# Patient Record
Sex: Male | Born: 1955
Health system: Southern US, Community
[De-identification: ages and names within clinical notes are randomized; demographics above are authoritative.]

## PROBLEM LIST (undated history)

## (undated) DIAGNOSIS — M1990 Unspecified osteoarthritis, unspecified site: Secondary | ICD-10-CM

## (undated) DIAGNOSIS — D751 Secondary polycythemia: Principal | ICD-10-CM

## (undated) DIAGNOSIS — R918 Other nonspecific abnormal finding of lung field: Secondary | ICD-10-CM

## (undated) DIAGNOSIS — R7989 Other specified abnormal findings of blood chemistry: Secondary | ICD-10-CM

## (undated) DIAGNOSIS — S22009A Unspecified fracture of unspecified thoracic vertebra, initial encounter for closed fracture: Secondary | ICD-10-CM

## (undated) DIAGNOSIS — D45 Polycythemia vera: Secondary | ICD-10-CM

## (undated) DIAGNOSIS — K219 Gastro-esophageal reflux disease without esophagitis: Secondary | ICD-10-CM

## (undated) DIAGNOSIS — M109 Gout, unspecified: Secondary | ICD-10-CM

## (undated) HISTORY — DX: Polycythemia vera: D45

## (undated) HISTORY — DX: Gout, unspecified: M10.9

## (undated) HISTORY — DX: Unspecified osteoarthritis, unspecified site: M19.90

## (undated) HISTORY — DX: Gastro-esophageal reflux disease without esophagitis: K21.9

## (undated) HISTORY — DX: Other nonspecific abnormal finding of lung field: R91.8

## (undated) HISTORY — DX: Secondary polycythemia: D75.1

## (undated) HISTORY — DX: Other specified abnormal findings of blood chemistry: R79.89

## (undated) HISTORY — DX: Unspecified fracture of unspecified thoracic vertebra, initial encounter for closed fracture: S22.009A

---

## 2011-12-18 HISTORY — PX: COLONOSCOPY: SHX174

## 2013-09-28 DIAGNOSIS — H11439 Conjunctival hyperemia, unspecified eye: Secondary | ICD-10-CM | POA: Insufficient documentation

## 2013-12-30 ENCOUNTER — Emergency Department: Payer: Self-pay | Admitting: Emergency Medicine

## 2014-01-01 ENCOUNTER — Ambulatory Visit: Payer: Self-pay | Admitting: Internal Medicine

## 2014-01-01 ENCOUNTER — Ambulatory Visit: Payer: Self-pay | Admitting: Cardiothoracic Surgery

## 2014-01-01 LAB — COMPREHENSIVE METABOLIC PANEL
ALK PHOS: 89 U/L
AST: 18 U/L (ref 15–37)
Albumin: 3.9 g/dL (ref 3.4–5.0)
Anion Gap: 10 (ref 7–16)
BILIRUBIN TOTAL: 0.5 mg/dL (ref 0.2–1.0)
BUN: 12 mg/dL (ref 7–18)
Calcium, Total: 8.6 mg/dL (ref 8.5–10.1)
Chloride: 101 mmol/L (ref 98–107)
Co2: 30 mmol/L (ref 21–32)
Creatinine: 0.99 mg/dL (ref 0.60–1.30)
Glucose: 104 mg/dL — ABNORMAL HIGH (ref 65–99)
Osmolality: 281 (ref 275–301)
Potassium: 3.9 mmol/L (ref 3.5–5.1)
SGPT (ALT): 26 U/L (ref 12–78)
Sodium: 141 mmol/L (ref 136–145)
Total Protein: 7.4 g/dL (ref 6.4–8.2)

## 2014-01-01 LAB — CBC CANCER CENTER
Basophil #: 0.1 x10 3/mm (ref 0.0–0.1)
Basophil %: 1.2 %
Eosinophil #: 0.1 x10 3/mm (ref 0.0–0.7)
Eosinophil %: 1.5 %
HCT: 40.4 % (ref 40.0–52.0)
HGB: 12.5 g/dL — ABNORMAL LOW (ref 13.0–18.0)
LYMPHS ABS: 0.9 x10 3/mm — AB (ref 1.0–3.6)
LYMPHS PCT: 12.9 %
MCH: 28.6 pg (ref 26.0–34.0)
MCHC: 31 g/dL — ABNORMAL LOW (ref 32.0–36.0)
MCV: 92 fL (ref 80–100)
Monocyte #: 0.5 x10 3/mm (ref 0.2–1.0)
Monocyte %: 6.6 %
Neutrophil #: 5.6 x10 3/mm (ref 1.4–6.5)
Neutrophil %: 77.8 %
Platelet: 190 x10 3/mm (ref 150–440)
RBC: 4.38 10*6/uL — AB (ref 4.40–5.90)
RDW: 17.2 % — AB (ref 11.5–14.5)
WBC: 7.2 x10 3/mm (ref 3.8–10.6)

## 2014-01-04 LAB — PROT IMMUNOELECTROPHORES(ARMC)

## 2014-01-04 LAB — KAPPA/LAMBDA FREE LIGHT CHAINS (ARMC)

## 2014-01-06 LAB — CBC CANCER CENTER
Basophil #: 0.1 x10 3/mm (ref 0.0–0.1)
Basophil %: 1 %
EOS PCT: 1.6 %
Eosinophil #: 0.1 x10 3/mm (ref 0.0–0.7)
HCT: 42.2 % (ref 40.0–52.0)
HGB: 13.4 g/dL (ref 13.0–18.0)
Lymphocyte #: 0.9 x10 3/mm — ABNORMAL LOW (ref 1.0–3.6)
Lymphocyte %: 11.9 %
MCH: 29.4 pg (ref 26.0–34.0)
MCHC: 31.6 g/dL — ABNORMAL LOW (ref 32.0–36.0)
MCV: 93 fL (ref 80–100)
MONO ABS: 0.3 x10 3/mm (ref 0.2–1.0)
Monocyte %: 3.3 %
NEUTROS ABS: 6.4 x10 3/mm (ref 1.4–6.5)
Neutrophil %: 82.2 %
Platelet: 156 x10 3/mm (ref 150–440)
RBC: 4.55 10*6/uL (ref 4.40–5.90)
RDW: 17.2 % — AB (ref 11.5–14.5)
WBC: 7.8 x10 3/mm (ref 3.8–10.6)

## 2014-01-07 LAB — CEA: CEA: 1.2 ng/mL (ref 0.0–4.7)

## 2014-01-13 LAB — CBC CANCER CENTER
Basophil #: 0.1 x10 3/mm (ref 0.0–0.1)
Basophil %: 0.8 %
Eosinophil #: 0.2 x10 3/mm (ref 0.0–0.7)
Eosinophil %: 2 %
HCT: 40.2 % (ref 40.0–52.0)
HGB: 12.7 g/dL — ABNORMAL LOW (ref 13.0–18.0)
LYMPHS ABS: 1.1 x10 3/mm (ref 1.0–3.6)
Lymphocyte %: 12.3 %
MCH: 29.1 pg (ref 26.0–34.0)
MCHC: 31.6 g/dL — AB (ref 32.0–36.0)
MCV: 92 fL (ref 80–100)
MONO ABS: 0.4 x10 3/mm (ref 0.2–1.0)
MONOS PCT: 4.4 %
NEUTROS PCT: 80.5 %
Neutrophil #: 7.2 x10 3/mm — ABNORMAL HIGH (ref 1.4–6.5)
PLATELETS: 244 x10 3/mm (ref 150–440)
RBC: 4.37 10*6/uL — AB (ref 4.40–5.90)
RDW: 16.9 % — AB (ref 11.5–14.5)
WBC: 9 x10 3/mm (ref 3.8–10.6)

## 2014-01-14 LAB — PSA: PSA: 1 ng/mL (ref 0.0–4.0)

## 2014-01-17 ENCOUNTER — Ambulatory Visit: Payer: Self-pay | Admitting: Cardiothoracic Surgery

## 2014-01-20 LAB — CBC CANCER CENTER
BASOS PCT: 1 %
Basophil #: 0.1 x10 3/mm (ref 0.0–0.1)
EOS ABS: 0.2 x10 3/mm (ref 0.0–0.7)
EOS PCT: 2.5 %
HCT: 37.5 % — ABNORMAL LOW (ref 40.0–52.0)
HGB: 11.9 g/dL — ABNORMAL LOW (ref 13.0–18.0)
LYMPHS ABS: 0.9 x10 3/mm — AB (ref 1.0–3.6)
LYMPHS PCT: 12.1 %
MCH: 28.9 pg (ref 26.0–34.0)
MCHC: 31.7 g/dL — AB (ref 32.0–36.0)
MCV: 91 fL (ref 80–100)
MONO ABS: 0.3 x10 3/mm (ref 0.2–1.0)
Monocyte %: 4.4 %
Neutrophil #: 5.8 x10 3/mm (ref 1.4–6.5)
Neutrophil %: 80 %
PLATELETS: 339 x10 3/mm (ref 150–440)
RBC: 4.1 10*6/uL — ABNORMAL LOW (ref 4.40–5.90)
RDW: 16.3 % — AB (ref 11.5–14.5)
WBC: 7.3 x10 3/mm (ref 3.8–10.6)

## 2014-01-27 LAB — CBC CANCER CENTER
BASOS PCT: 0.8 %
Basophil #: 0 x10 3/mm (ref 0.0–0.1)
EOS ABS: 0.2 x10 3/mm (ref 0.0–0.7)
Eosinophil %: 2.7 %
HCT: 37.1 % — ABNORMAL LOW (ref 40.0–52.0)
HGB: 11.7 g/dL — AB (ref 13.0–18.0)
Lymphocyte #: 1.1 x10 3/mm (ref 1.0–3.6)
Lymphocyte %: 16.8 %
MCH: 28.4 pg (ref 26.0–34.0)
MCHC: 31.5 g/dL — ABNORMAL LOW (ref 32.0–36.0)
MCV: 90 fL (ref 80–100)
MONO ABS: 0.4 x10 3/mm (ref 0.2–1.0)
Monocyte %: 5.7 %
NEUTROS ABS: 4.8 x10 3/mm (ref 1.4–6.5)
Neutrophil %: 74 %
Platelet: 184 x10 3/mm (ref 150–440)
RBC: 4.11 10*6/uL — AB (ref 4.40–5.90)
RDW: 15.8 % — AB (ref 11.5–14.5)
WBC: 6.6 x10 3/mm (ref 3.8–10.6)

## 2014-01-27 LAB — CREATININE, SERUM
Creatinine: 1.12 mg/dL (ref 0.60–1.30)
EGFR (African American): >60
EGFR (Non-African Amer.): >60

## 2014-02-08 LAB — CBC CANCER CENTER
BASOS ABS: 0.1 x10 3/mm (ref 0.0–0.1)
Basophil %: 0.7 %
EOS PCT: 2.8 %
Eosinophil #: 0.3 x10 3/mm (ref 0.0–0.7)
HCT: 38.8 % — AB (ref 40.0–52.0)
HGB: 12.1 g/dL — ABNORMAL LOW (ref 13.0–18.0)
Lymphocyte #: 1.5 x10 3/mm (ref 1.0–3.6)
Lymphocyte %: 15.7 %
MCH: 27.8 pg (ref 26.0–34.0)
MCHC: 31.1 g/dL — ABNORMAL LOW (ref 32.0–36.0)
MCV: 89 fL (ref 80–100)
Monocyte #: 0.5 x10 3/mm (ref 0.2–1.0)
Monocyte %: 5.2 %
NEUTROS ABS: 7.1 x10 3/mm — AB (ref 1.4–6.5)
Neutrophil %: 75.6 %
PLATELETS: 206 x10 3/mm (ref 150–440)
RBC: 4.34 10*6/uL — ABNORMAL LOW (ref 4.40–5.90)
RDW: 15.5 % — AB (ref 11.5–14.5)
WBC: 9.4 x10 3/mm (ref 3.8–10.6)

## 2014-02-14 ENCOUNTER — Ambulatory Visit: Payer: Self-pay | Admitting: Cardiothoracic Surgery

## 2014-02-19 LAB — CBC CANCER CENTER
Basophil #: 0.1 x10 3/mm (ref 0.0–0.1)
Basophil %: 0.8 %
Eosinophil #: 0.3 x10 3/mm (ref 0.0–0.7)
Eosinophil %: 3 %
HCT: 39.9 % — ABNORMAL LOW (ref 40.0–52.0)
HGB: 12.4 g/dL — AB (ref 13.0–18.0)
LYMPHS PCT: 15.1 %
Lymphocyte #: 1.4 x10 3/mm (ref 1.0–3.6)
MCH: 27 pg (ref 26.0–34.0)
MCHC: 31.1 g/dL — AB (ref 32.0–36.0)
MCV: 87 fL (ref 80–100)
MONO ABS: 0.5 x10 3/mm (ref 0.2–1.0)
Monocyte %: 5.2 %
NEUTROS ABS: 7 x10 3/mm — AB (ref 1.4–6.5)
Neutrophil %: 75.9 %
PLATELETS: 388 x10 3/mm (ref 150–440)
RBC: 4.58 10*6/uL (ref 4.40–5.90)
RDW: 15.8 % — AB (ref 11.5–14.5)
WBC: 9.2 x10 3/mm (ref 3.8–10.6)

## 2014-03-02 ENCOUNTER — Emergency Department: Payer: Self-pay | Admitting: Emergency Medicine

## 2014-03-02 LAB — CBC WITH DIFFERENTIAL/PLATELET
Basophil #: 0.1 10*3/uL (ref 0.0–0.1)
Basophil %: 1.1 %
Eosinophil #: 0.3 10*3/uL (ref 0.0–0.7)
Eosinophil %: 3 %
HCT: 40.4 % (ref 40.0–52.0)
HGB: 12.7 g/dL — ABNORMAL LOW (ref 13.0–18.0)
Lymphocyte #: 1 10*3/uL (ref 1.0–3.6)
Lymphocyte %: 10.8 %
MCH: 27 pg (ref 26.0–34.0)
MCHC: 31.5 g/dL — AB (ref 32.0–36.0)
MCV: 86 fL (ref 80–100)
Monocyte #: 0.3 x10 3/mm (ref 0.2–1.0)
Monocyte %: 3.7 %
NEUTROS ABS: 7.5 10*3/uL — AB (ref 1.4–6.5)
Neutrophil %: 81.4 %
Platelet: 134 10*3/uL — ABNORMAL LOW (ref 150–440)
RBC: 4.71 10*6/uL (ref 4.40–5.90)
RDW: 16 % — ABNORMAL HIGH (ref 11.5–14.5)
WBC: 9.2 10*3/uL (ref 3.8–10.6)

## 2014-03-02 LAB — COMPREHENSIVE METABOLIC PANEL
ALK PHOS: 96 U/L
ALT: 24 U/L (ref 12–78)
AST: 20 U/L (ref 15–37)
Albumin: 3.7 g/dL (ref 3.4–5.0)
Anion Gap: 5 — ABNORMAL LOW (ref 7–16)
BILIRUBIN TOTAL: 0.6 mg/dL (ref 0.2–1.0)
BUN: 9 mg/dL (ref 7–18)
Calcium, Total: 8.6 mg/dL (ref 8.5–10.1)
Chloride: 109 mmol/L — ABNORMAL HIGH (ref 98–107)
Co2: 26 mmol/L (ref 21–32)
Creatinine: 0.99 mg/dL (ref 0.60–1.30)
EGFR (African American): >60
EGFR (Non-African Amer.): >60
GLUCOSE: 108 mg/dL — AB (ref 65–99)
OSMOLALITY: 279 (ref 275–301)
Potassium: 3.8 mmol/L (ref 3.5–5.1)
Sodium: 140 mmol/L (ref 136–145)
Total Protein: 7 g/dL (ref 6.4–8.2)

## 2014-03-02 LAB — LIPASE, BLOOD: Lipase: 248 U/L (ref 73–393)

## 2014-03-04 LAB — CBC CANCER CENTER
BASOS ABS: 0.1 x10 3/mm (ref 0.0–0.1)
Basophil %: 1.2 %
Eosinophil #: 0.4 x10 3/mm (ref 0.0–0.7)
Eosinophil %: 3.6 %
HCT: 40.5 % (ref 40.0–52.0)
HGB: 12.5 g/dL — AB (ref 13.0–18.0)
Lymphocyte #: 1.5 x10 3/mm (ref 1.0–3.6)
Lymphocyte %: 14.6 %
MCH: 26.3 pg (ref 26.0–34.0)
MCHC: 30.9 g/dL — AB (ref 32.0–36.0)
MCV: 85 fL (ref 80–100)
MONO ABS: 0.4 x10 3/mm (ref 0.2–1.0)
MONOS PCT: 4 %
NEUTROS ABS: 7.9 x10 3/mm — AB (ref 1.4–6.5)
NEUTROS PCT: 76.6 %
PLATELETS: 163 x10 3/mm (ref 150–440)
RBC: 4.75 10*6/uL (ref 4.40–5.90)
RDW: 16.3 % — ABNORMAL HIGH (ref 11.5–14.5)
WBC: 10.3 x10 3/mm (ref 3.8–10.6)

## 2014-03-17 ENCOUNTER — Ambulatory Visit: Payer: Self-pay | Admitting: Cardiothoracic Surgery

## 2014-03-18 LAB — CBC CANCER CENTER
BASOS ABS: 0.1 x10 3/mm (ref 0.0–0.1)
BASOS PCT: 0.9 %
Eosinophil #: 0.4 x10 3/mm (ref 0.0–0.7)
Eosinophil %: 3.9 %
HCT: 40.3 % (ref 40.0–52.0)
HGB: 12.3 g/dL — AB (ref 13.0–18.0)
Lymphocyte #: 1.5 x10 3/mm (ref 1.0–3.6)
Lymphocyte %: 14.9 %
MCH: 25.8 pg — ABNORMAL LOW (ref 26.0–34.0)
MCHC: 30.6 g/dL — AB (ref 32.0–36.0)
MCV: 84 fL (ref 80–100)
MONO ABS: 0.5 x10 3/mm (ref 0.2–1.0)
Monocyte %: 5.1 %
NEUTROS ABS: 7.6 x10 3/mm — AB (ref 1.4–6.5)
Neutrophil %: 75.2 %
PLATELETS: 358 x10 3/mm (ref 150–440)
RBC: 4.78 10*6/uL (ref 4.40–5.90)
RDW: 16.3 % — AB (ref 11.5–14.5)
WBC: 10.1 x10 3/mm (ref 3.8–10.6)

## 2014-03-29 LAB — CBC CANCER CENTER
Basophil #: 0.1 x10 3/mm (ref 0.0–0.1)
Basophil %: 1.1 %
EOS ABS: 0.3 x10 3/mm (ref 0.0–0.7)
Eosinophil %: 4.1 %
HCT: 39.7 % — ABNORMAL LOW (ref 40.0–52.0)
HGB: 12.1 g/dL — ABNORMAL LOW (ref 13.0–18.0)
Lymphocyte #: 1.3 x10 3/mm (ref 1.0–3.6)
Lymphocyte %: 15.6 %
MCH: 25.2 pg — AB (ref 26.0–34.0)
MCHC: 30.5 g/dL — AB (ref 32.0–36.0)
MCV: 83 fL (ref 80–100)
MONOS PCT: 4.4 %
Monocyte #: 0.4 x10 3/mm (ref 0.2–1.0)
NEUTROS PCT: 74.8 %
Neutrophil #: 6.2 x10 3/mm (ref 1.4–6.5)
PLATELETS: 151 x10 3/mm (ref 150–440)
RBC: 4.8 10*6/uL (ref 4.40–5.90)
RDW: 16.2 % — AB (ref 11.5–14.5)
WBC: 8.3 x10 3/mm (ref 3.8–10.6)

## 2014-04-02 LAB — CANCER CENTER HEMATOCRIT: HCT: 40.6 % (ref 40.0–52.0)

## 2014-04-02 LAB — CANCER CTR PLATELET CT: Platelet: 168 x10 3/mm (ref 150–440)

## 2014-04-08 LAB — CBC CANCER CENTER
Basophil #: 0.1 x10 3/mm (ref 0.0–0.1)
Basophil %: 0.9 %
Eosinophil #: 0.4 x10 3/mm (ref 0.0–0.7)
Eosinophil %: 4.1 %
HCT: 40.9 % (ref 40.0–52.0)
HGB: 12.3 g/dL — ABNORMAL LOW (ref 13.0–18.0)
LYMPHS ABS: 1.4 x10 3/mm (ref 1.0–3.6)
LYMPHS PCT: 15.3 %
MCH: 24.8 pg — ABNORMAL LOW (ref 26.0–34.0)
MCHC: 30 g/dL — ABNORMAL LOW (ref 32.0–36.0)
MCV: 83 fL (ref 80–100)
Monocyte #: 0.5 x10 3/mm (ref 0.2–1.0)
Monocyte %: 5 %
NEUTROS ABS: 6.8 x10 3/mm — AB (ref 1.4–6.5)
Neutrophil %: 74.7 %
PLATELETS: 288 x10 3/mm (ref 150–440)
RBC: 4.94 10*6/uL (ref 4.40–5.90)
RDW: 16.6 % — AB (ref 11.5–14.5)
WBC: 9.1 x10 3/mm (ref 3.8–10.6)

## 2014-04-16 ENCOUNTER — Ambulatory Visit: Payer: Self-pay | Admitting: Cardiothoracic Surgery

## 2014-04-26 LAB — CBC CANCER CENTER
BASOS ABS: 0.1 x10 3/mm (ref 0.0–0.1)
Basophil %: 1.1 %
EOS PCT: 3.8 %
Eosinophil #: 0.4 x10 3/mm (ref 0.0–0.7)
HCT: 40 % (ref 40.0–52.0)
HGB: 12.4 g/dL — ABNORMAL LOW (ref 13.0–18.0)
LYMPHS PCT: 14.8 %
Lymphocyte #: 1.4 x10 3/mm (ref 1.0–3.6)
MCH: 24.9 pg — ABNORMAL LOW (ref 26.0–34.0)
MCHC: 30.9 g/dL — ABNORMAL LOW (ref 32.0–36.0)
MCV: 81 fL (ref 80–100)
Monocyte #: 0.4 x10 3/mm (ref 0.2–1.0)
Monocyte %: 4.3 %
Neutrophil #: 7.4 x10 3/mm — ABNORMAL HIGH (ref 1.4–6.5)
Neutrophil %: 76 %
Platelet: 236 x10 3/mm (ref 150–440)
RBC: 4.95 10*6/uL (ref 4.40–5.90)
RDW: 16.5 % — AB (ref 11.5–14.5)
WBC: 9.7 x10 3/mm (ref 3.8–10.6)

## 2014-05-12 LAB — CBC CANCER CENTER
BASOS ABS: 0.1 x10 3/mm (ref 0.0–0.1)
Basophil %: 1.1 %
EOS ABS: 0.4 x10 3/mm (ref 0.0–0.7)
Eosinophil %: 4 %
HCT: 42.6 % (ref 40.0–52.0)
HGB: 13.2 g/dL (ref 13.0–18.0)
LYMPHS PCT: 14.8 %
Lymphocyte #: 1.4 x10 3/mm (ref 1.0–3.6)
MCH: 24.9 pg — ABNORMAL LOW (ref 26.0–34.0)
MCHC: 31 g/dL — ABNORMAL LOW (ref 32.0–36.0)
MCV: 80 fL (ref 80–100)
Monocyte #: 0.5 x10 3/mm (ref 0.2–1.0)
Monocyte %: 5 %
NEUTROS ABS: 7.2 x10 3/mm — AB (ref 1.4–6.5)
NEUTROS PCT: 75.1 %
Platelet: 354 x10 3/mm (ref 150–440)
RBC: 5.32 10*6/uL (ref 4.40–5.90)
RDW: 16.2 % — ABNORMAL HIGH (ref 11.5–14.5)
WBC: 9.6 x10 3/mm (ref 3.8–10.6)

## 2014-05-17 ENCOUNTER — Ambulatory Visit: Payer: Self-pay | Admitting: Cardiothoracic Surgery

## 2014-05-24 LAB — CBC CANCER CENTER
BASOS ABS: 0.1 x10 3/mm (ref 0.0–0.1)
Basophil %: 1.4 %
EOS PCT: 3.9 %
Eosinophil #: 0.4 x10 3/mm (ref 0.0–0.7)
HCT: 42.8 % (ref 40.0–52.0)
HGB: 13.2 g/dL (ref 13.0–18.0)
LYMPHS ABS: 1.4 x10 3/mm (ref 1.0–3.6)
LYMPHS PCT: 13.7 %
MCH: 24.3 pg — AB (ref 26.0–34.0)
MCHC: 30.8 g/dL — AB (ref 32.0–36.0)
MCV: 79 fL — ABNORMAL LOW (ref 80–100)
MONO ABS: 0.4 x10 3/mm (ref 0.2–1.0)
Monocyte %: 3.8 %
NEUTROS ABS: 8 x10 3/mm — AB (ref 1.4–6.5)
Neutrophil %: 77.2 %
Platelet: 207 x10 3/mm (ref 150–440)
RBC: 5.41 10*6/uL (ref 4.40–5.90)
RDW: 16.1 % — AB (ref 11.5–14.5)
WBC: 10.4 x10 3/mm (ref 3.8–10.6)

## 2014-06-03 LAB — CBC CANCER CENTER
BASOS PCT: 1.1 %
Basophil #: 0.1 x10 3/mm (ref 0.0–0.1)
EOS ABS: 0.4 x10 3/mm (ref 0.0–0.7)
EOS PCT: 3.7 %
HCT: 40.8 % (ref 40.0–52.0)
HGB: 12.4 g/dL — ABNORMAL LOW (ref 13.0–18.0)
LYMPHS PCT: 12.6 %
Lymphocyte #: 1.4 x10 3/mm (ref 1.0–3.6)
MCH: 23.8 pg — ABNORMAL LOW (ref 26.0–34.0)
MCHC: 30.4 g/dL — ABNORMAL LOW (ref 32.0–36.0)
MCV: 78 fL — AB (ref 80–100)
MONOS PCT: 4.2 %
Monocyte #: 0.5 x10 3/mm (ref 0.2–1.0)
Neutrophil #: 9 x10 3/mm — ABNORMAL HIGH (ref 1.4–6.5)
Neutrophil %: 78.4 %
PLATELETS: 281 x10 3/mm (ref 150–440)
RBC: 5.22 10*6/uL (ref 4.40–5.90)
RDW: 15.9 % — ABNORMAL HIGH (ref 11.5–14.5)
WBC: 11.5 x10 3/mm — ABNORMAL HIGH (ref 3.8–10.6)

## 2014-06-16 ENCOUNTER — Ambulatory Visit: Payer: Self-pay | Admitting: Cardiothoracic Surgery

## 2014-06-17 LAB — CBC CANCER CENTER
BASOS PCT: 0.9 %
Basophil #: 0.1 x10 3/mm (ref 0.0–0.1)
Eosinophil #: 0.3 x10 3/mm (ref 0.0–0.7)
Eosinophil %: 3.3 %
HCT: 41 % (ref 40.0–52.0)
HGB: 12.5 g/dL — ABNORMAL LOW (ref 13.0–18.0)
LYMPHS ABS: 1.3 x10 3/mm (ref 1.0–3.6)
Lymphocyte %: 13.9 %
MCH: 23.8 pg — ABNORMAL LOW (ref 26.0–34.0)
MCHC: 30.5 g/dL — AB (ref 32.0–36.0)
MCV: 78 fL — ABNORMAL LOW (ref 80–100)
MONOS PCT: 4.8 %
Monocyte #: 0.5 x10 3/mm (ref 0.2–1.0)
NEUTROS ABS: 7.4 x10 3/mm — AB (ref 1.4–6.5)
Neutrophil %: 77.1 %
Platelet: 280 x10 3/mm (ref 150–440)
RBC: 5.24 10*6/uL (ref 4.40–5.90)
RDW: 16.6 % — AB (ref 11.5–14.5)
WBC: 9.6 x10 3/mm (ref 3.8–10.6)

## 2014-07-08 LAB — CBC CANCER CENTER
BASOS ABS: 0.1 x10 3/mm (ref 0.0–0.1)
Basophil %: 1.2 %
EOS PCT: 3.3 %
Eosinophil #: 0.3 x10 3/mm (ref 0.0–0.7)
HCT: 42.8 % (ref 40.0–52.0)
HGB: 13.3 g/dL (ref 13.0–18.0)
Lymphocyte #: 1.3 x10 3/mm (ref 1.0–3.6)
Lymphocyte %: 12.7 %
MCH: 24.3 pg — ABNORMAL LOW (ref 26.0–34.0)
MCHC: 31 g/dL — AB (ref 32.0–36.0)
MCV: 78 fL — ABNORMAL LOW (ref 80–100)
MONOS PCT: 3.8 %
Monocyte #: 0.4 x10 3/mm (ref 0.2–1.0)
Neutrophil #: 7.8 x10 3/mm — ABNORMAL HIGH (ref 1.4–6.5)
Neutrophil %: 79 %
Platelet: 386 x10 3/mm (ref 150–440)
RBC: 5.46 10*6/uL (ref 4.40–5.90)
RDW: 17.2 % — ABNORMAL HIGH (ref 11.5–14.5)
WBC: 9.9 x10 3/mm (ref 3.8–10.6)

## 2014-07-17 ENCOUNTER — Ambulatory Visit: Payer: Self-pay | Admitting: Cardiothoracic Surgery

## 2014-07-29 LAB — CBC CANCER CENTER
BASOS PCT: 1.1 %
Basophil #: 0.1 x10 3/mm (ref 0.0–0.1)
EOS PCT: 4.4 %
Eosinophil #: 0.5 x10 3/mm (ref 0.0–0.7)
HCT: 42.9 % (ref 40.0–52.0)
HGB: 13.1 g/dL (ref 13.0–18.0)
Lymphocyte #: 1.4 x10 3/mm (ref 1.0–3.6)
Lymphocyte %: 13.2 %
MCH: 23.7 pg — AB (ref 26.0–34.0)
MCHC: 30.6 g/dL — ABNORMAL LOW (ref 32.0–36.0)
MCV: 77 fL — ABNORMAL LOW (ref 80–100)
MONOS PCT: 4.3 %
Monocyte #: 0.5 x10 3/mm (ref 0.2–1.0)
NEUTROS ABS: 8.1 x10 3/mm — AB (ref 1.4–6.5)
Neutrophil %: 77 %
PLATELETS: 321 x10 3/mm (ref 150–440)
RBC: 5.55 10*6/uL (ref 4.40–5.90)
RDW: 16.6 % — ABNORMAL HIGH (ref 11.5–14.5)
WBC: 10.5 x10 3/mm (ref 3.8–10.6)

## 2014-08-17 ENCOUNTER — Ambulatory Visit: Payer: Self-pay | Admitting: Cardiothoracic Surgery

## 2014-08-19 LAB — CBC CANCER CENTER
Basophil #: 0.1 x10 3/mm (ref 0.0–0.1)
Basophil %: 0.9 %
Eosinophil #: 0.3 x10 3/mm (ref 0.0–0.7)
Eosinophil %: 3.1 %
HCT: 43.8 % (ref 40.0–52.0)
HGB: 13.5 g/dL (ref 13.0–18.0)
Lymphocyte #: 1.2 x10 3/mm (ref 1.0–3.6)
Lymphocyte %: 12.3 %
MCH: 23.9 pg — ABNORMAL LOW (ref 26.0–34.0)
MCHC: 30.9 g/dL — ABNORMAL LOW (ref 32.0–36.0)
MCV: 78 fL — ABNORMAL LOW (ref 80–100)
Monocyte #: 0.4 x10 3/mm (ref 0.2–1.0)
Monocyte %: 4 %
Neutrophil #: 8.1 x10 3/mm — ABNORMAL HIGH (ref 1.4–6.5)
Neutrophil %: 79.7 %
Platelet: 204 x10 3/mm (ref 150–440)
RBC: 5.65 10*6/uL (ref 4.40–5.90)
RDW: 17.2 % — AB (ref 11.5–14.5)
WBC: 10.2 x10 3/mm (ref 3.8–10.6)

## 2014-08-30 LAB — CBC CANCER CENTER
Basophil #: 0.1 x10 3/mm (ref 0.0–0.1)
Basophil %: 1 %
Eosinophil #: 0.4 x10 3/mm (ref 0.0–0.7)
Eosinophil %: 3.8 %
HCT: 41.9 % (ref 40.0–52.0)
HGB: 12.9 g/dL — ABNORMAL LOW (ref 13.0–18.0)
LYMPHS ABS: 1.4 x10 3/mm (ref 1.0–3.6)
Lymphocyte %: 12.7 %
MCH: 23.8 pg — ABNORMAL LOW (ref 26.0–34.0)
MCHC: 30.7 g/dL — ABNORMAL LOW (ref 32.0–36.0)
MCV: 78 fL — ABNORMAL LOW (ref 80–100)
MONO ABS: 0.4 x10 3/mm (ref 0.2–1.0)
MONOS PCT: 3.5 %
Neutrophil #: 8.7 x10 3/mm — ABNORMAL HIGH (ref 1.4–6.5)
Neutrophil %: 79 %
Platelet: 458 x10 3/mm — ABNORMAL HIGH (ref 150–440)
RBC: 5.41 10*6/uL (ref 4.40–5.90)
RDW: 16.6 % — ABNORMAL HIGH (ref 11.5–14.5)
WBC: 11 x10 3/mm — ABNORMAL HIGH (ref 3.8–10.6)

## 2014-09-06 LAB — CBC CANCER CENTER
BASOS ABS: 0.1 x10 3/mm (ref 0.0–0.1)
BASOS PCT: 1.2 %
EOS PCT: 3.4 %
Eosinophil #: 0.3 x10 3/mm (ref 0.0–0.7)
HCT: 41.2 % (ref 40.0–52.0)
HGB: 12.5 g/dL — ABNORMAL LOW (ref 13.0–18.0)
Lymphocyte #: 1.2 x10 3/mm (ref 1.0–3.6)
Lymphocyte %: 11.5 %
MCH: 23.5 pg — ABNORMAL LOW (ref 26.0–34.0)
MCHC: 30.4 g/dL — ABNORMAL LOW (ref 32.0–36.0)
MCV: 77 fL — ABNORMAL LOW (ref 80–100)
MONO ABS: 0.4 x10 3/mm (ref 0.2–1.0)
Monocyte %: 4.2 %
Neutrophil #: 8.1 x10 3/mm — ABNORMAL HIGH (ref 1.4–6.5)
Neutrophil %: 79.7 %
Platelet: 326 x10 3/mm (ref 150–440)
RBC: 5.33 10*6/uL (ref 4.40–5.90)
RDW: 17.3 % — AB (ref 11.5–14.5)
WBC: 10.1 x10 3/mm (ref 3.8–10.6)

## 2014-09-16 ENCOUNTER — Ambulatory Visit: Payer: Self-pay | Admitting: Internal Medicine

## 2014-09-16 ENCOUNTER — Ambulatory Visit: Payer: Self-pay | Admitting: Cardiothoracic Surgery

## 2014-09-20 LAB — CBC CANCER CENTER
BASOS PCT: 1.4 %
Basophil #: 0.1 x10 3/mm (ref 0.0–0.1)
Eosinophil #: 0.3 x10 3/mm (ref 0.0–0.7)
Eosinophil %: 3.1 %
HCT: 42.2 % (ref 40.0–52.0)
HGB: 13 g/dL (ref 13.0–18.0)
LYMPHS PCT: 13.4 %
Lymphocyte #: 1.4 x10 3/mm (ref 1.0–3.6)
MCH: 23.8 pg — ABNORMAL LOW (ref 26.0–34.0)
MCHC: 30.9 g/dL — AB (ref 32.0–36.0)
MCV: 77 fL — ABNORMAL LOW (ref 80–100)
MONO ABS: 0.4 x10 3/mm (ref 0.2–1.0)
Monocyte %: 3.9 %
NEUTROS PCT: 78.2 %
Neutrophil #: 8.1 x10 3/mm — ABNORMAL HIGH (ref 1.4–6.5)
Platelet: 268 x10 3/mm (ref 150–440)
RBC: 5.49 10*6/uL (ref 4.40–5.90)
RDW: 16.8 % — ABNORMAL HIGH (ref 11.5–14.5)
WBC: 10.4 x10 3/mm (ref 3.8–10.6)

## 2014-10-01 LAB — CBC CANCER CENTER
BASOS PCT: 1 %
Basophil #: 0.1 x10 3/mm (ref 0.0–0.1)
EOS PCT: 3.7 %
Eosinophil #: 0.3 x10 3/mm (ref 0.0–0.7)
HCT: 41.8 % (ref 40.0–52.0)
HGB: 12.5 g/dL — ABNORMAL LOW (ref 13.0–18.0)
Lymphocyte #: 1.3 x10 3/mm (ref 1.0–3.6)
Lymphocyte %: 13.8 %
MCH: 23.2 pg — AB (ref 26.0–34.0)
MCHC: 30 g/dL — AB (ref 32.0–36.0)
MCV: 77 fL — AB (ref 80–100)
MONO ABS: 0.3 x10 3/mm (ref 0.2–1.0)
MONOS PCT: 3.6 %
NEUTROS ABS: 7.3 x10 3/mm — AB (ref 1.4–6.5)
NEUTROS PCT: 77.9 %
PLATELETS: 317 x10 3/mm (ref 150–440)
RBC: 5.41 10*6/uL (ref 4.40–5.90)
RDW: 16.5 % — ABNORMAL HIGH (ref 11.5–14.5)
WBC: 9.3 x10 3/mm (ref 3.8–10.6)

## 2014-10-15 LAB — CBC CANCER CENTER
BASOS ABS: 0.1 x10 3/mm (ref 0.0–0.1)
Basophil %: 0.8 %
Eosinophil #: 0.4 x10 3/mm (ref 0.0–0.7)
Eosinophil %: 3.9 %
HCT: 45.1 % (ref 40.0–52.0)
HGB: 13.6 g/dL (ref 13.0–18.0)
LYMPHS PCT: 11.9 %
Lymphocyte #: 1.3 x10 3/mm (ref 1.0–3.6)
MCH: 23.1 pg — ABNORMAL LOW (ref 26.0–34.0)
MCHC: 30.2 g/dL — ABNORMAL LOW (ref 32.0–36.0)
MCV: 77 fL — ABNORMAL LOW (ref 80–100)
MONO ABS: 0.4 x10 3/mm (ref 0.2–1.0)
MONOS PCT: 4.2 %
Neutrophil #: 8.4 x10 3/mm — ABNORMAL HIGH (ref 1.4–6.5)
Neutrophil %: 79.2 %
Platelet: 239 x10 3/mm (ref 150–440)
RBC: 5.9 10*6/uL (ref 4.40–5.90)
RDW: 16.2 % — ABNORMAL HIGH (ref 11.5–14.5)
WBC: 10.6 x10 3/mm (ref 3.8–10.6)

## 2014-10-17 ENCOUNTER — Ambulatory Visit: Payer: Self-pay | Admitting: Internal Medicine

## 2014-10-17 ENCOUNTER — Ambulatory Visit: Payer: Self-pay | Admitting: Cardiothoracic Surgery

## 2014-10-25 LAB — CBC CANCER CENTER
Basophil #: 0.1 x10 3/mm (ref 0.0–0.1)
Basophil %: 0.8 %
EOS ABS: 0.4 x10 3/mm (ref 0.0–0.7)
Eosinophil %: 3.6 %
HCT: 39.4 % — ABNORMAL LOW (ref 40.0–52.0)
HGB: 12 g/dL — AB (ref 13.0–18.0)
Lymphocyte #: 1.3 x10 3/mm (ref 1.0–3.6)
Lymphocyte %: 12.4 %
MCH: 23.5 pg — AB (ref 26.0–34.0)
MCHC: 30.6 g/dL — ABNORMAL LOW (ref 32.0–36.0)
MCV: 77 fL — ABNORMAL LOW (ref 80–100)
MONO ABS: 0.4 x10 3/mm (ref 0.2–1.0)
Monocyte %: 3.9 %
NEUTROS ABS: 8.5 x10 3/mm — AB (ref 1.4–6.5)
Neutrophil %: 79.3 %
Platelet: 361 x10 3/mm (ref 150–440)
RBC: 5.13 10*6/uL (ref 4.40–5.90)
RDW: 16.5 % — ABNORMAL HIGH (ref 11.5–14.5)
WBC: 10.7 x10 3/mm — ABNORMAL HIGH (ref 3.8–10.6)

## 2014-11-08 LAB — CBC CANCER CENTER
BASOS ABS: 0.1 x10 3/mm (ref 0.0–0.1)
Basophil %: 1 %
EOS ABS: 0.4 x10 3/mm (ref 0.0–0.7)
EOS PCT: 3.8 %
HCT: 41.8 % (ref 40.0–52.0)
HGB: 12.5 g/dL — ABNORMAL LOW (ref 13.0–18.0)
LYMPHS ABS: 1.3 x10 3/mm (ref 1.0–3.6)
Lymphocyte %: 12.5 %
MCH: 22.9 pg — AB (ref 26.0–34.0)
MCHC: 30 g/dL — ABNORMAL LOW (ref 32.0–36.0)
MCV: 76 fL — AB (ref 80–100)
Monocyte #: 0.4 x10 3/mm (ref 0.2–1.0)
Monocyte %: 3.5 %
Neutrophil #: 8.2 x10 3/mm — ABNORMAL HIGH (ref 1.4–6.5)
Neutrophil %: 79.2 %
PLATELETS: 236 x10 3/mm (ref 150–440)
RBC: 5.48 10*6/uL (ref 4.40–5.90)
RDW: 16.9 % — ABNORMAL HIGH (ref 11.5–14.5)
WBC: 10.3 x10 3/mm (ref 3.8–10.6)

## 2014-11-16 ENCOUNTER — Ambulatory Visit: Payer: Self-pay | Admitting: Internal Medicine

## 2014-11-22 LAB — CBC CANCER CENTER
BASOS ABS: 0.1 x10 3/mm (ref 0.0–0.1)
BASOS PCT: 1 %
EOS ABS: 0.5 x10 3/mm (ref 0.0–0.7)
Eosinophil %: 4.1 %
HCT: 41.5 % (ref 40.0–52.0)
HGB: 12.7 g/dL — ABNORMAL LOW (ref 13.0–18.0)
LYMPHS ABS: 1.3 x10 3/mm (ref 1.0–3.6)
Lymphocyte %: 11.4 %
MCH: 23 pg — ABNORMAL LOW (ref 26.0–34.0)
MCHC: 30.6 g/dL — ABNORMAL LOW (ref 32.0–36.0)
MCV: 75 fL — ABNORMAL LOW (ref 80–100)
MONO ABS: 0.5 x10 3/mm (ref 0.2–1.0)
Monocyte %: 4.5 %
NEUTROS ABS: 9.3 x10 3/mm — AB (ref 1.4–6.5)
Neutrophil %: 79 %
Platelet: 453 x10 3/mm — ABNORMAL HIGH (ref 150–440)
RBC: 5.52 10*6/uL (ref 4.40–5.90)
RDW: 17 % — AB (ref 11.5–14.5)
WBC: 11.8 x10 3/mm — ABNORMAL HIGH (ref 3.8–10.6)

## 2014-12-03 LAB — CBC CANCER CENTER
BASOS ABS: 0.1 x10 3/mm (ref 0.0–0.1)
BASOS PCT: 1.2 %
EOS PCT: 3.6 %
Eosinophil #: 0.4 x10 3/mm (ref 0.0–0.7)
HCT: 41 % (ref 40.0–52.0)
HGB: 12.7 g/dL — ABNORMAL LOW (ref 13.0–18.0)
LYMPHS PCT: 12.1 %
Lymphocyte #: 1.2 x10 3/mm (ref 1.0–3.6)
MCH: 23.2 pg — AB (ref 26.0–34.0)
MCHC: 31 g/dL — ABNORMAL LOW (ref 32.0–36.0)
MCV: 75 fL — ABNORMAL LOW (ref 80–100)
MONO ABS: 0.4 x10 3/mm (ref 0.2–1.0)
Monocyte %: 3.6 %
NEUTROS PCT: 79.5 %
Neutrophil #: 8.2 x10 3/mm — ABNORMAL HIGH (ref 1.4–6.5)
Platelet: 231 x10 3/mm (ref 150–440)
RBC: 5.49 10*6/uL (ref 4.40–5.90)
RDW: 17.1 % — ABNORMAL HIGH (ref 11.5–14.5)
WBC: 10.3 x10 3/mm (ref 3.8–10.6)

## 2014-12-17 ENCOUNTER — Ambulatory Visit: Payer: Self-pay | Admitting: Internal Medicine

## 2014-12-22 LAB — CBC CANCER CENTER
BASOS ABS: 0.1 x10 3/mm (ref 0.0–0.1)
Basophil %: 1 %
EOS PCT: 3.9 %
Eosinophil #: 0.4 x10 3/mm (ref 0.0–0.7)
HCT: 42 % (ref 40.0–52.0)
HGB: 12.8 g/dL — ABNORMAL LOW (ref 13.0–18.0)
LYMPHS ABS: 1.4 x10 3/mm (ref 1.0–3.6)
LYMPHS PCT: 12.3 %
MCH: 22.5 pg — ABNORMAL LOW (ref 26.0–34.0)
MCHC: 30.5 g/dL — ABNORMAL LOW (ref 32.0–36.0)
MCV: 74 fL — ABNORMAL LOW (ref 80–100)
Monocyte #: 0.4 x10 3/mm (ref 0.2–1.0)
Monocyte %: 3.5 %
NEUTROS PCT: 79.3 %
Neutrophil #: 8.9 x10 3/mm — ABNORMAL HIGH (ref 1.4–6.5)
PLATELETS: 367 x10 3/mm (ref 150–440)
RBC: 5.68 10*6/uL (ref 4.40–5.90)
RDW: 16.8 % — AB (ref 11.5–14.5)
WBC: 11.2 x10 3/mm — ABNORMAL HIGH (ref 3.8–10.6)

## 2015-01-12 LAB — CBC CANCER CENTER
Basophil #: 0.2 x10 3/mm — ABNORMAL HIGH (ref 0.0–0.1)
Basophil %: 1.3 %
Eosinophil #: 0.5 x10 3/mm (ref 0.0–0.7)
Eosinophil %: 4.1 %
HCT: 42.9 % (ref 40.0–52.0)
HGB: 13.2 g/dL (ref 13.0–18.0)
Lymphocyte #: 1.5 x10 3/mm (ref 1.0–3.6)
Lymphocyte %: 13.1 %
MCH: 22.7 pg — AB (ref 26.0–34.0)
MCHC: 30.6 g/dL — AB (ref 32.0–36.0)
MCV: 74 fL — ABNORMAL LOW (ref 80–100)
Monocyte #: 0.4 x10 3/mm (ref 0.2–1.0)
Monocyte %: 3.7 %
NEUTROS PCT: 77.8 %
Neutrophil #: 9.2 x10 3/mm — ABNORMAL HIGH (ref 1.4–6.5)
PLATELETS: 348 x10 3/mm (ref 150–440)
RBC: 5.81 10*6/uL (ref 4.40–5.90)
RDW: 17.1 % — ABNORMAL HIGH (ref 11.5–14.5)
WBC: 11.8 x10 3/mm — ABNORMAL HIGH (ref 3.8–10.6)

## 2015-01-17 ENCOUNTER — Ambulatory Visit: Payer: Self-pay | Admitting: Cardiothoracic Surgery

## 2015-02-15 ENCOUNTER — Ambulatory Visit
Admit: 2015-02-15 | Disposition: A | Discharge status: Home / Self Care | Payer: Self-pay | Attending: Internal Medicine | Admitting: Internal Medicine

## 2015-03-11 LAB — CBC CANCER CENTER
Basophil #: 0.1 x10 3/mm (ref 0.0–0.1)
Basophil %: 1.1 %
EOS PCT: 4.1 %
Eosinophil #: 0.4 x10 3/mm (ref 0.0–0.7)
HCT: 40.8 % (ref 40.0–52.0)
HGB: 12.7 g/dL — ABNORMAL LOW (ref 13.0–18.0)
LYMPHS ABS: 1.2 x10 3/mm (ref 1.0–3.6)
Lymphocyte %: 11.2 %
MCH: 23.1 pg — AB (ref 26.0–34.0)
MCHC: 31.2 g/dL — ABNORMAL LOW (ref 32.0–36.0)
MCV: 74 fL — AB (ref 80–100)
Monocyte #: 0.3 x10 3/mm (ref 0.2–1.0)
Monocyte %: 3.3 %
Neutrophil #: 8.4 x10 3/mm — ABNORMAL HIGH (ref 1.4–6.5)
Neutrophil %: 80.3 %
Platelet: 350 x10 3/mm (ref 150–440)
RBC: 5.51 10*6/uL (ref 4.40–5.90)
RDW: 17.4 % — ABNORMAL HIGH (ref 11.5–14.5)
WBC: 10.4 x10 3/mm (ref 3.8–10.6)

## 2015-03-18 ENCOUNTER — Ambulatory Visit
Admit: 2015-03-18 | Disposition: A | Discharge status: Home / Self Care | Payer: Self-pay | Attending: Internal Medicine | Admitting: Internal Medicine

## 2015-04-06 LAB — CBC CANCER CENTER
BASOS ABS: 0.1 x10 3/mm (ref 0.0–0.1)
Basophil %: 1.1 %
EOS ABS: 0.5 x10 3/mm (ref 0.0–0.7)
EOS PCT: 3.9 %
HCT: 42.8 % (ref 40.0–52.0)
HGB: 13 g/dL (ref 13.0–18.0)
LYMPHS ABS: 1.3 x10 3/mm (ref 1.0–3.6)
Lymphocyte %: 11.3 %
MCH: 22.8 pg — ABNORMAL LOW (ref 26.0–34.0)
MCHC: 30.5 g/dL — AB (ref 32.0–36.0)
MCV: 75 fL — AB (ref 80–100)
Monocyte #: 0.4 x10 3/mm (ref 0.2–1.0)
Monocyte %: 3.6 %
Neutrophil #: 9.3 x10 3/mm — ABNORMAL HIGH (ref 1.4–6.5)
Neutrophil %: 80.1 %
Platelet: 346 x10 3/mm (ref 150–440)
RBC: 5.72 10*6/uL (ref 4.40–5.90)
RDW: 17.1 % — AB (ref 11.5–14.5)
WBC: 11.6 x10 3/mm — ABNORMAL HIGH (ref 3.8–10.6)

## 2015-05-03 ENCOUNTER — Other Ambulatory Visit: Payer: Self-pay | Admitting: *Deleted

## 2015-05-03 DIAGNOSIS — D45 Polycythemia vera: Secondary | ICD-10-CM

## 2015-05-05 ENCOUNTER — Encounter: Payer: Self-pay | Admitting: Internal Medicine

## 2015-05-05 ENCOUNTER — Inpatient Hospital Stay (HOSPITAL_BASED_OUTPATIENT_CLINIC_OR_DEPARTMENT_OTHER): Payer: Managed Care, Other (non HMO) | Admitting: Internal Medicine

## 2015-05-05 ENCOUNTER — Encounter (INDEPENDENT_AMBULATORY_CARE_PROVIDER_SITE_OTHER): Payer: Self-pay

## 2015-05-05 ENCOUNTER — Inpatient Hospital Stay: Payer: Managed Care, Other (non HMO) | Attending: Internal Medicine

## 2015-05-05 ENCOUNTER — Inpatient Hospital Stay: Payer: Managed Care, Other (non HMO)

## 2015-05-05 VITALS — BP 132/91 | HR 89 | Temp 97.5°F | Resp 16 | Wt 198.4 lb

## 2015-05-05 DIAGNOSIS — D45 Polycythemia vera: Secondary | ICD-10-CM | POA: Diagnosis not present

## 2015-05-05 DIAGNOSIS — Z79899 Other long term (current) drug therapy: Secondary | ICD-10-CM | POA: Insufficient documentation

## 2015-05-05 DIAGNOSIS — M810 Age-related osteoporosis without current pathological fracture: Secondary | ICD-10-CM

## 2015-05-05 DIAGNOSIS — E119 Type 2 diabetes mellitus without complications: Secondary | ICD-10-CM

## 2015-05-05 DIAGNOSIS — R51 Headache: Secondary | ICD-10-CM

## 2015-05-05 LAB — CBC WITH DIFFERENTIAL/PLATELET
BASOS ABS: 0.1 10*3/uL (ref 0–0.1)
EOS ABS: 0.3 10*3/uL (ref 0–0.7)
Eosinophils Relative: 3 %
HCT: 44.2 % (ref 40.0–52.0)
HEMOGLOBIN: 13.5 g/dL (ref 13.0–18.0)
Lymphocytes Relative: 10 %
Lymphs Abs: 1.1 10*3/uL (ref 1.0–3.6)
MCH: 22.9 pg — AB (ref 26.0–34.0)
MCHC: 30.6 g/dL — AB (ref 32.0–36.0)
MCV: 74.7 fL — ABNORMAL LOW (ref 80.0–100.0)
MONO ABS: 0.4 10*3/uL (ref 0.2–1.0)
Monocytes Relative: 3 %
NEUTROS ABS: 8.7 10*3/uL — AB (ref 1.4–6.5)
Neutrophils Relative %: 83 %
Platelets: 333 10*3/uL (ref 150–440)
RBC: 5.92 MIL/uL — ABNORMAL HIGH (ref 4.40–5.90)
RDW: 17.1 % — ABNORMAL HIGH (ref 11.5–14.5)
WBC: 10.6 10*3/uL (ref 3.8–10.6)

## 2015-05-05 NOTE — Progress Notes (Signed)
[place] note   Referred by No referring provider defined for this encounter.   This 59 y.o. male patient presents to the clinic for follow-up of polycythemia vera also flow pulmonary nodules that now appear benign and serial scanning   Chief Complaint/Problem List: 1. compression fx tspine following a fall , attributed to coexisting osteoporosis which in turn is attributed to low testosterone 2. lucent areas nonspecific lower t spine on ct  3. pulmonary nodules found incidently when ct spine performed,  4. p vera dx july 2014, after presented with headaches, jak 2 pos,  followed initially [facility], , tx hydrea plus phlebotomy, transferred care to cancer center 5.Low testosterone level, patient subsequently declined testosterone replacement 6. Intermittent ruq pain, resolved 6. Hypertension  7.    HPI: Refer also to initial January/2016 note to, and recent office notes, essentially patient who was seen in [facility], polycythemia, had a fall and back pain, compression fracture, initially suspicious-looking area but  benign on imaging. He also had headaches that it investigated at [facility] , headaches continued to wax and wane. Pulmonary nodules were discovered and were followed on serial scans. High blood pressure noted and referred to primary care to be established patient saw urology although now declined to go on testosterone replacement .Marland Kitchen Today patient returns. No acute complaints. Still waxing and waning headache and some headache today. They are not accompanied by any oura,  visual disturbance of focal weakness or dizziness or nausea or shortness of breath or chest discomfort . Since last visit CT chest noncontrast was done and pulmonary nodules were reviewed and radiologist recommended benign appearance that does not need further monitoring.     Review of Systems:  General: No acute distress, No fatigue, No recent weight loss, fever chills or sweats   HEENT: No , dizziness, ear or jaw  pain, or epistaxis   Lungs: No cough, shortness of breath, at rest, No SOBOE, No wheezing, No chest pain, No, hemoptysis  Cardiac: no chest pain, no palpitations, no orthostasis, no lower extremity    GI: no abdominal pain, nausea, vomiting, diahrrea, or reflux   GU: no dysuria, no hematuria, no vaginal bleeding  Musculoskeletal: no back pain, no bone pain, no acutely painful joints,   Extremities: no upper or lower extremity edema  Skin: no bruising, no rash  Neuro: , no dizzy, no focal weakness   Psych: no anxiety, no depression   Allergies Allergies  Allergen Reactions  . Penicillin G     Other reaction(s): Localized superficial swelling of skin    Significant History/PMH: Past Medical History  Diagnosis Date  . GERD (gastroesophageal reflux disease)   . Gout   . Polycythemia vera   . Arthritis   . Pulmonary nodules   . Thoracic spine fracture     compression fracture following a fall  . Low serum testosterone level    Past Surgical History  Procedure Laterality Date  . Colonoscopy  2013            Smoking History: Never smoker, some past history of alcohol use  PFSH: Family History:  Family History  Problem Relation Age of Onset  . Throat cancer      uncle    Comments:   Social History:  History  Alcohol Use  . 0.0 oz/week  . 0 Standard drinks or equivalent per week    Comment: occasional alcohol use    Additional Past Medical and Surgical History:    Home Medications: Prior  to Admission medications   Medication Sig Start Date End Date Taking? Authorizing Provider  Aspirin (ASPIR-81 PO) Take 1 tablet by mouth 1 day or 1 dose.   Yes Historical Provider, MD  Calcium Carb-Cholecalciferol 600-200 MG-UNIT TABS Take 1 tablet by mouth 1 day or 1 dose.   Yes Historical Provider, MD  cholecalciferol (VITAMIN D) 1000 UNITS tablet Take 1 tablet by mouth 1 day or 1 dose.   Yes Historical Provider, MD  fluticasone (FLONASE) 50 MCG/ACT nasal spray Place 2 sprays  into the nose 1 day or 1 dose. 07/29/14  Yes Historical Provider, MD  hydroxyurea (HYDREA) 500 MG capsule Take 1,500 mg by mouth daily. 02/20/15  Yes Historical Provider, MD  Multiple Vitamins-Minerals (MULTIVITAMIN ADULT PO) Take 1 tablet by mouth 1 day or 1 dose.   Yes Historical Provider, MD    Vital Signs:  Blood pressure 132/91, pulse 89, temperature 97.5 F (36.4 C), temperature source Tympanic, resp. rate 16, weight 198 lb 6.6 oz (90 kg).  Physical Exam:  General: well developed, well nourished, and no acute distress  Mental Status: alert and oriented to person, place and time  Head, Ears, Nose,Throat: No thrush  Respiratory: no rales, rhonchi, or wheezing, no dullness  Cardiovascular: regular rate and rhythm  Gastrointestinal: soft, non tender, no masses or organomegaly  Musculoskeletal: no lower extremity edema   Skin: no rashes, no bruises  Neurological: No gross focal weakness cranial nerves intact  Lymphatics: Not palpable, neck supraclavicular, submandibular, axilla    Psych: Mood, Affect, Unremarkable    Laboratory Results: Appointment on 05/05/2015  Component Date Value Ref Range Status  . WBC 05/05/2015 10.6  3.8 - 10.6 K/uL Final  . RBC 05/05/2015 5.92* 4.40 - 5.90 MIL/uL Final  . Hemoglobin 05/05/2015 13.5  13.0 - 18.0 g/dL Final   RESULT REPEATED AND VERIFIED  . HCT 05/05/2015 44.2  40.0 - 52.0 % Final   RESULT REPEATED AND VERIFIED  . MCV 05/05/2015 74.7* 80.0 - 100.0 fL Final  . MCH 05/05/2015 22.9* 26.0 - 34.0 pg Final  . MCHC 05/05/2015 30.6* 32.0 - 36.0 g/dL Final  . RDW 05/05/2015 17.1* 11.5 - 14.5 % Final  . Platelets 05/05/2015 333  150 - 440 K/uL Final  . Neutrophils Relative % 05/05/2015 83%   Final  . Neutro Abs 05/05/2015 8.7* 1.4 - 6.5 K/uL Final  . Lymphocytes Relative 05/05/2015 10%   Final  . Lymphs Abs 05/05/2015 1.1  1.0 - 3.6 K/uL Final  . Monocytes Relative 05/05/2015 3%   Final  . Monocytes Absolute 05/05/2015 0.4  0.2 - 1.0 K/uL Final  .  Eosinophils Relative 05/05/2015 3%   Final  . Eosinophils Absolute 05/05/2015 0.3  0 - 0.7 K/uL Final  . Basophils Relative 05/05/2015 1%   Final  . Basophils Absolute 05/05/2015 0.1  0 - 0.1 K/uL Final          Radiology Results: Most recent imaging and noncontrast CT of the chest on April 1         Assessment and Plan: Impression: SEE ALSO PROBLEM LIST 1. CT REPORTED SOME SUSPICIOUS BONE FINDINGS, NON SPECIFIC LUCENCIES T SPINE, NO EVIDENCE MYELOMA, ELECTROPHERESIS WNL, NOW MRI ALSO NO SIGN BONE MALIGNANCY, CEA AND PSA NORMAL,  2. T SPINE FRACTURE EVEN WITH TRAUMA NOT TYPICAL ACCORDING TO ORTHOPEDICS, SO LOOKED FOR OSTEOPOROSIS, FOUND OSTEOPENIA ABNORMAL FOR AGE, PAIN FROM FX STEADILY BETTER ESSENTIALLY RESOLVED. Marland Kitchen 3. THEN DEMONSTRATED LOW TESTOSTERONE AS LIKELY CAUSE OF OSTEOPOROSIS. 4. PULMONARY  NODULES, METASTATIC CANCER IN DIFFERENTAL BUT UNLIKELY  NON SPECIFIC. CT LAST DONE 3/25  4. POLYCYTHEMIA VERA, BORDERLINE LYMPHOPENIA ATTRIBUTE TO HYDREA, THEN BACK TO NORMAL LYMPHS, , INITIALLY FREQUENT PHLEBOTOMY TO CONTROL COUNT,. PLTS CONTROLLED NOW WAX AND WANE  5.. HEADACHES, WAX AND WANE . 6. HAS SEEN GI FOR ABDO SYMPTOMS ARE RESOLVED WITH DIETARY CHANGES  7 AS PRIOR NOTED, . IN [place] PERFORMED PHLEBOTOMY WITH HCT BELOW TARGET, BUT NO CLEAR EFFECT OR CORRELATION OF HCT BELOW TARGET TO HEADACHE..8 NOW CT stable nodules, from jan 2015 to April 2016, radiology is recommending of his most recent report that these are benign appearing and did not need repeat imaging....Marland Kitchen9. patient has medical problems including need for you for primary care plus follow-up possible evaluation of headache. Blood pressure follow-up of low testosterone ... Today, patient is clinically stable, only mild headache as noted, exam benign, platelets good white count good hemoglobin slightly high at is above target last CT scan is reviewed is good    Plan: Phlebotomy today 350 cc. I will continue to follow up  [place] check in 2  weeks but that approximately monthly. Titrate Hydrea when necessary. Keep unremarkable below 45 by targeting 43.5 patient will see primary care. If he ever goes on testosterone replacement this would drive hope his blood counts will have to check him more closely

## 2015-05-20 ENCOUNTER — Inpatient Hospital Stay: Payer: Managed Care, Other (non HMO) | Attending: Internal Medicine

## 2015-05-20 ENCOUNTER — Inpatient Hospital Stay: Payer: Managed Care, Other (non HMO)

## 2015-05-20 DIAGNOSIS — D45 Polycythemia vera: Secondary | ICD-10-CM | POA: Diagnosis not present

## 2015-05-20 DIAGNOSIS — Z79899 Other long term (current) drug therapy: Secondary | ICD-10-CM | POA: Diagnosis not present

## 2015-05-20 LAB — CBC WITH DIFFERENTIAL/PLATELET
BASOS ABS: 0.3 10*3/uL — AB (ref 0–0.1)
EOS ABS: 0.4 10*3/uL (ref 0–0.7)
Eosinophils Relative: 5 %
HEMATOCRIT: 42.4 % (ref 40.0–52.0)
HEMOGLOBIN: 12.9 g/dL — AB (ref 13.0–18.0)
LYMPHS ABS: 0.8 10*3/uL — AB (ref 1.0–3.6)
Lymphocytes Relative: 8 %
MCH: 23 pg — ABNORMAL LOW (ref 26.0–34.0)
MCHC: 30.5 g/dL — ABNORMAL LOW (ref 32.0–36.0)
MCV: 75.4 fL — ABNORMAL LOW (ref 80.0–100.0)
MONO ABS: 0.3 10*3/uL (ref 0.2–1.0)
Monocytes Relative: 3 %
NEUTROS ABS: 7.8 10*3/uL — AB (ref 1.4–6.5)
Platelets: 284 10*3/uL (ref 150–440)
RBC: 5.63 MIL/uL (ref 4.40–5.90)
RDW: 17.1 % — AB (ref 11.5–14.5)
WBC: 9.7 10*3/uL (ref 3.8–10.6)

## 2015-06-02 ENCOUNTER — Inpatient Hospital Stay: Payer: Managed Care, Other (non HMO)

## 2015-06-02 ENCOUNTER — Inpatient Hospital Stay: Payer: Managed Care, Other (non HMO) | Admitting: Family Medicine

## 2015-06-02 DIAGNOSIS — D45 Polycythemia vera: Secondary | ICD-10-CM | POA: Diagnosis not present

## 2015-06-02 LAB — CBC WITH DIFFERENTIAL/PLATELET
Basophils Absolute: 0.1 10*3/uL (ref 0–0.1)
Eosinophils Absolute: 0.4 10*3/uL (ref 0–0.7)
Eosinophils Relative: 4 %
HCT: 43.1 % (ref 40.0–52.0)
HEMOGLOBIN: 13.2 g/dL (ref 13.0–18.0)
LYMPHS ABS: 1.1 10*3/uL (ref 1.0–3.6)
Lymphocytes Relative: 11 %
MCH: 22.9 pg — ABNORMAL LOW (ref 26.0–34.0)
MCHC: 30.5 g/dL — ABNORMAL LOW (ref 32.0–36.0)
MCV: 75.1 fL — ABNORMAL LOW (ref 80.0–100.0)
MONO ABS: 0.3 10*3/uL (ref 0.2–1.0)
NEUTROS ABS: 8.2 10*3/uL — AB (ref 1.4–6.5)
Neutrophils Relative %: 81 %
Platelets: 347 10*3/uL (ref 150–440)
RBC: 5.74 MIL/uL (ref 4.40–5.90)
RDW: 17.5 % — ABNORMAL HIGH (ref 11.5–14.5)
WBC: 10.1 10*3/uL (ref 3.8–10.6)

## 2015-06-06 ENCOUNTER — Telehealth: Payer: Self-pay | Admitting: *Deleted

## 2015-06-06 ENCOUNTER — Other Ambulatory Visit: Payer: Self-pay | Admitting: Family Medicine

## 2015-06-06 NOTE — Telephone Encounter (Addendum)
V.O. Georgeanne Nim, AGNP-C-His hct was 43.1. Per documentation from Dr. Marylene Land last note his target Hct is 43.5 and goal Hct to be less than 45.  If he is symptomatic and insists, we can schedule a low volume phlebotomy of 150 mls.

## 2015-06-06 NOTE — Telephone Encounter (Signed)
Message sent to scheduling to call pt with appt  For this week

## 2015-06-08 ENCOUNTER — Other Ambulatory Visit: Payer: Managed Care, Other (non HMO)

## 2015-06-08 ENCOUNTER — Inpatient Hospital Stay: Payer: Managed Care, Other (non HMO)

## 2015-06-08 VITALS — BP 118/83 | HR 91

## 2015-06-08 DIAGNOSIS — D751 Secondary polycythemia: Secondary | ICD-10-CM

## 2015-06-08 HISTORY — DX: Secondary polycythemia: D75.1

## 2015-06-24 NOTE — Progress Notes (Signed)
This encounter was created in error - please disregard.

## 2015-06-30 ENCOUNTER — Inpatient Hospital Stay: Payer: Managed Care, Other (non HMO) | Attending: Family Medicine

## 2015-06-30 ENCOUNTER — Inpatient Hospital Stay (HOSPITAL_BASED_OUTPATIENT_CLINIC_OR_DEPARTMENT_OTHER): Payer: Managed Care, Other (non HMO) | Admitting: Family Medicine

## 2015-06-30 ENCOUNTER — Inpatient Hospital Stay: Payer: Managed Care, Other (non HMO)

## 2015-06-30 VITALS — BP 139/87 | HR 88 | Temp 98.4°F | Resp 16 | Wt 198.0 lb

## 2015-06-30 DIAGNOSIS — Z79899 Other long term (current) drug therapy: Secondary | ICD-10-CM | POA: Diagnosis not present

## 2015-06-30 DIAGNOSIS — R918 Other nonspecific abnormal finding of lung field: Secondary | ICD-10-CM

## 2015-06-30 DIAGNOSIS — D45 Polycythemia vera: Secondary | ICD-10-CM | POA: Insufficient documentation

## 2015-06-30 LAB — CBC WITH DIFFERENTIAL/PLATELET
Basophils Absolute: 0.1 10*3/uL (ref 0–0.1)
Basophils Relative: 1 %
Eosinophils Absolute: 0.4 10*3/uL (ref 0–0.7)
HCT: 41.7 % (ref 40.0–52.0)
Hemoglobin: 12.6 g/dL — ABNORMAL LOW (ref 13.0–18.0)
Lymphocytes Relative: 11 %
Lymphs Abs: 1.1 10*3/uL (ref 1.0–3.6)
MCH: 22.8 pg — AB (ref 26.0–34.0)
MCHC: 30.3 g/dL — ABNORMAL LOW (ref 32.0–36.0)
MCV: 75.2 fL — AB (ref 80.0–100.0)
Monocytes Absolute: 0.4 10*3/uL (ref 0.2–1.0)
Monocytes Relative: 4 %
NEUTROS ABS: 7.7 10*3/uL — AB (ref 1.4–6.5)
Platelets: 303 10*3/uL (ref 150–440)
RBC: 5.54 MIL/uL (ref 4.40–5.90)
RDW: 17.4 % — ABNORMAL HIGH (ref 11.5–14.5)
WBC: 9.7 10*3/uL (ref 3.8–10.6)

## 2015-06-30 NOTE — Progress Notes (Signed)
[place]  [phone]  [phone]     Derek [name] DOB: [birth year]  MR#: [id number]  WUX#:[id number]  Patient Care Team: No Pcp Per Patient as PCP - General (General Practice)  CHIEF COMPLAINT:  Chief Complaint  Patient presents with  . Follow-up   Patient is here for follow-up regarding polycythemia vera, diagnosed in July 2014, JAK 2 positive.  INTERVAL HISTORY:  Patient is here for continued follow-up and further evaluation regarding polycythemia vera. Patient also has a history of low testosterone and at one time was considering testosterone replacement. Discussed this with patient today and he has decided not to undergo such replacement. Today her hematocrit is 41.7 hemoglobin 12.6. He reports feeling very well and denies any acute complaints. No blurry vision, headaches, or dizziness.  REVIEW OF SYSTEMS:   Review of Systems  Constitutional: Negative for fever, chills, weight loss, malaise/fatigue and diaphoresis.  HENT: Negative for congestion, ear discharge, ear pain, hearing loss, nosebleeds, sore throat and tinnitus.   Eyes: Negative for blurred vision, double vision, photophobia, pain, discharge and redness.  Respiratory: Negative for cough, hemoptysis, sputum production, shortness of breath, wheezing and stridor.   Cardiovascular: Negative for chest pain, palpitations, orthopnea, claudication, leg swelling and PND.  Gastrointestinal: Negative for heartburn, nausea, vomiting, abdominal pain, diarrhea, constipation, blood in stool and melena.  Genitourinary: Negative.   Musculoskeletal: Negative.   Skin: Negative.   Neurological: Negative for dizziness, tingling, focal weakness, seizures, weakness and headaches.  Endo/Heme/Allergies: Does not bruise/bleed easily.  Psychiatric/Behavioral: Negative for depression. The patient is not nervous/anxious and does not have insomnia.     As per HPI. Otherwise, a complete review of systems is  negatve.  ONCOLOGY HISTORY:  No history exists.    PAST MEDICAL HISTORY: Past Medical History  Diagnosis Date  . GERD (gastroesophageal reflux disease)   . Gout   . Polycythemia vera   . Arthritis   . Pulmonary nodules   . Thoracic spine fracture     compression fracture following a fall  . Low serum testosterone level   . Polycythemia, secondary 06/08/2015    PAST SURGICAL HISTORY: Past Surgical History  Procedure Laterality Date  . Colonoscopy  2013    FAMILY HISTORY Family History  Problem Relation Age of Onset  . Throat cancer      uncle    GYNECOLOGIC HISTORY:  No LMP for male patient.     ADVANCED DIRECTIVES:    HEALTH MAINTENANCE: History  Substance Use Topics  . Smoking status: Never Smoker   . Smokeless tobacco: Not on file  . Alcohol Use: 0.0 oz/week    0 Standard drinks or equivalent per week     Comment: occasional alcohol use     Colonoscopy:  PAP:  Bone density:  Lipid panel:  Allergies  Allergen Reactions  . Penicillin G     Other reaction(s): Localized superficial swelling of skin    Current Outpatient Prescriptions  Medication Sig Dispense Refill  . Aspirin (ASPIR-81 PO) Take 1 tablet by mouth 1 day or 1 dose.    . Calcium Carb-Cholecalciferol 600-200 MG-UNIT TABS Take 1 tablet by mouth 1 day or 1 dose.    . cholecalciferol (VITAMIN D) 1000 UNITS tablet Take 1 tablet by mouth 1 day or 1 dose.    . fluticasone (FLONASE) 50 MCG/ACT nasal spray Place 2 sprays into the nose 1 day or 1 dose.    . hydroxyurea (HYDREA) 500 MG capsule Take 1,500 mg by mouth  daily.  5  . Multiple Vitamins-Minerals (MULTIVITAMIN ADULT PO) Take 1 tablet by mouth 1 day or 1 dose.     No current facility-administered medications for this visit.    OBJECTIVE: BP 139/87 mmHg  Pulse 88  Temp(Src) 98.4 F (36.9 C) (Tympanic)  Resp 16  Wt 197 lb 15.6 oz (89.801 kg)   Body mass index is 30 kg/(m^2).    ECOG FS:0 - Asymptomatic  General: Well-developed,  well-nourished, no acute distress. Eyes: Pink conjunctiva, anicteric sclera. HEENT: Normocephalic, moist mucous membranes, clear oropharnyx. Lungs: Clear to auscultation bilaterally. Heart: Regular rate and rhythm. No rubs, murmurs, or gallops. Abdomen: Soft, nontender, nondistended. No organomegaly noted, normoactive bowel sounds. Musculoskeletal: No edema, cyanosis, or clubbing. Neuro: Alert, answering all questions appropriately. Cranial nerves grossly intact. Skin: No rashes or petechiae noted. Psych: Normal affect.   LAB RESULTS:  Appointment on 06/30/2015  Component Date Value Ref Range Status  . WBC 06/30/2015 9.7  3.8 - 10.6 K/uL Final  . RBC 06/30/2015 5.54  4.40 - 5.90 MIL/uL Final  . Hemoglobin 06/30/2015 12.6* 13.0 - 18.0 g/dL Final   RESULT REPEATED AND VERIFIED  . HCT 06/30/2015 41.7  40.0 - 52.0 % Final   RESULT REPEATED AND VERIFIED  . MCV 06/30/2015 75.2* 80.0 - 100.0 fL Final  . MCH 06/30/2015 22.8* 26.0 - 34.0 pg Final  . MCHC 06/30/2015 30.3* 32.0 - 36.0 g/dL Final  . RDW 06/30/2015 17.4* 11.5 - 14.5 % Final  . Platelets 06/30/2015 303  150 - 440 K/uL Final  . Neutrophils Relative % 06/30/2015 80%   Final  . Neutro Abs 06/30/2015 7.7* 1.4 - 6.5 K/uL Final  . Lymphocytes Relative 06/30/2015 11%   Final  . Lymphs Abs 06/30/2015 1.1  1.0 - 3.6 K/uL Final  . Monocytes Relative 06/30/2015 4%   Final  . Monocytes Absolute 06/30/2015 0.4  0.2 - 1.0 K/uL Final  . Eosinophils Relative 06/30/2015 4%   Final  . Eosinophils Absolute 06/30/2015 0.4  0 - 0.7 K/uL Final  . Basophils Relative 06/30/2015 1%   Final  . Basophils Absolute 06/30/2015 0.1  0 - 0.1 K/uL Final    STUDIES: No results found.  ASSESSMENT:  Polycythemia vera Lung nodules on CT scan  PLAN:   1. Polycythemia vera. Patient's target hematocrit is less than 43.5 if tolerated by patient. Patient states he would prefer to keep hematocrit target less than 43 as he gets headaches if it's any higher. He  did have a phlebotomy approximately 30 days ago. Hematocrit today 41.7 so no phlebotomy as required, patient is asymptomatic. We will continue with monthly CBCs 4 and with next provider visit in 4 months. 2. Lung nodules on CT scan. CT scan without contrast on 03/18/2015 there is no change in multiple tiny subpleural pulmonary nodules in the lungs bilaterally compared to as far back as 03/10/2014. Noted to strongly favor benign subpleural lymph nodes and no future imaging follow-up was recommended.  Patient expressed understanding and was in agreement with this plan. He also understands that He can call clinic at any time with any questions, concerns, or complaints.   Dr. Oliva Bustard was available for consultation and review of plan of care for this patient.   Evlyn Kanner, NP   06/30/2015 2:50 PM

## 2015-07-28 ENCOUNTER — Inpatient Hospital Stay: Payer: Managed Care, Other (non HMO)

## 2015-07-28 ENCOUNTER — Inpatient Hospital Stay: Payer: Managed Care, Other (non HMO) | Attending: Family Medicine

## 2015-08-04 ENCOUNTER — Inpatient Hospital Stay: Payer: Managed Care, Other (non HMO) | Attending: Family Medicine

## 2015-08-04 DIAGNOSIS — D45 Polycythemia vera: Secondary | ICD-10-CM | POA: Diagnosis not present

## 2015-08-04 LAB — CBC WITH DIFFERENTIAL/PLATELET
Basophils Absolute: 0.1 10*3/uL (ref 0–0.1)
EOS ABS: 0.4 10*3/uL (ref 0–0.7)
Eosinophils Relative: 4 %
HCT: 43.8 % (ref 40.0–52.0)
Hemoglobin: 13.5 g/dL (ref 13.0–18.0)
Lymphocytes Relative: 11 %
Lymphs Abs: 1.2 10*3/uL (ref 1.0–3.6)
MCH: 22.9 pg — ABNORMAL LOW (ref 26.0–34.0)
MCHC: 30.8 g/dL — AB (ref 32.0–36.0)
MCV: 74.3 fL — ABNORMAL LOW (ref 80.0–100.0)
Monocytes Absolute: 0.4 10*3/uL (ref 0.2–1.0)
Neutro Abs: 8.8 10*3/uL — ABNORMAL HIGH (ref 1.4–6.5)
Platelets: 466 10*3/uL — ABNORMAL HIGH (ref 150–440)
RBC: 5.9 MIL/uL (ref 4.40–5.90)
RDW: 17 % — ABNORMAL HIGH (ref 11.5–14.5)
WBC: 10.9 10*3/uL — ABNORMAL HIGH (ref 3.8–10.6)

## 2015-08-18 ENCOUNTER — Telehealth: Payer: Self-pay | Admitting: *Deleted

## 2015-08-18 DIAGNOSIS — D45 Polycythemia vera: Secondary | ICD-10-CM

## 2015-08-18 NOTE — Telephone Encounter (Signed)
Appt for tomorrow at 100 for lab 115 for possible phlebotomy made, left message on vm for pt and asked that he return my call

## 2015-08-18 NOTE — Telephone Encounter (Signed)
Asking if he can come in for lab check tomorrow, his appt is not until the 8th and he has had a ha for several days now.  This was a Dr Inez Pilgrim patient following with Magda Paganini for polycythemia

## 2015-08-19 ENCOUNTER — Inpatient Hospital Stay: Payer: Managed Care, Other (non HMO) | Attending: Family Medicine

## 2015-08-19 ENCOUNTER — Inpatient Hospital Stay: Payer: Managed Care, Other (non HMO)

## 2015-08-19 DIAGNOSIS — D45 Polycythemia vera: Secondary | ICD-10-CM | POA: Diagnosis not present

## 2015-08-19 LAB — CBC WITH DIFFERENTIAL/PLATELET
Basophils Absolute: 0.1 10*3/uL (ref 0–0.1)
Basophils Relative: 2 %
Eosinophils Absolute: 0.4 10*3/uL (ref 0–0.7)
Eosinophils Relative: 4 %
HCT: 41 % (ref 40.0–52.0)
Hemoglobin: 12.8 g/dL — ABNORMAL LOW (ref 13.0–18.0)
Lymphocytes Relative: 11 %
Lymphs Abs: 1.1 10*3/uL (ref 1.0–3.6)
MCH: 22.8 pg — ABNORMAL LOW (ref 26.0–34.0)
MCHC: 31.2 g/dL — ABNORMAL LOW (ref 32.0–36.0)
MCV: 73.3 fL — ABNORMAL LOW (ref 80.0–100.0)
Monocytes Absolute: 0.3 10*3/uL (ref 0.2–1.0)
Monocytes Relative: 3 %
Neutro Abs: 7.8 10*3/uL — ABNORMAL HIGH (ref 1.4–6.5)
Neutrophils Relative %: 80 %
Platelets: 255 10*3/uL (ref 150–440)
RBC: 5.6 MIL/uL (ref 4.40–5.90)
RDW: 17.2 % — ABNORMAL HIGH (ref 11.5–14.5)
WBC: 9.8 10*3/uL (ref 3.8–10.6)

## 2015-08-25 ENCOUNTER — Other Ambulatory Visit: Payer: Self-pay | Admitting: Hematology and Oncology

## 2015-08-25 ENCOUNTER — Inpatient Hospital Stay: Payer: Managed Care, Other (non HMO)

## 2015-08-25 VITALS — BP 126/78 | HR 92 | Resp 18

## 2015-08-25 DIAGNOSIS — D45 Polycythemia vera: Secondary | ICD-10-CM | POA: Diagnosis not present

## 2015-08-25 DIAGNOSIS — D751 Secondary polycythemia: Secondary | ICD-10-CM

## 2015-08-25 LAB — CBC WITH DIFFERENTIAL/PLATELET
Basophils Absolute: 0.1 10*3/uL (ref 0–0.1)
Basophils Relative: 1 %
Eosinophils Absolute: 0.4 10*3/uL (ref 0–0.7)
Eosinophils Relative: 4 %
HEMATOCRIT: 43.2 % (ref 40.0–52.0)
HEMOGLOBIN: 13.4 g/dL (ref 13.0–18.0)
LYMPHS ABS: 1.2 10*3/uL (ref 1.0–3.6)
Lymphocytes Relative: 11 %
MCH: 22.7 pg — AB (ref 26.0–34.0)
MCHC: 31.1 g/dL — AB (ref 32.0–36.0)
MCV: 72.9 fL — ABNORMAL LOW (ref 80.0–100.0)
MONO ABS: 0.3 10*3/uL (ref 0.2–1.0)
MONOS PCT: 3 %
NEUTROS ABS: 9.2 10*3/uL — AB (ref 1.4–6.5)
NEUTROS PCT: 81 %
Platelets: 276 10*3/uL (ref 150–440)
RBC: 5.92 MIL/uL — ABNORMAL HIGH (ref 4.40–5.90)
RDW: 16.9 % — AB (ref 11.5–14.5)
WBC: 11.2 10*3/uL — ABNORMAL HIGH (ref 3.8–10.6)

## 2015-09-22 ENCOUNTER — Inpatient Hospital Stay: Payer: Managed Care, Other (non HMO) | Attending: Family Medicine

## 2015-09-22 ENCOUNTER — Inpatient Hospital Stay: Payer: Managed Care, Other (non HMO)

## 2015-09-22 DIAGNOSIS — D45 Polycythemia vera: Secondary | ICD-10-CM | POA: Insufficient documentation

## 2015-09-22 LAB — CBC WITH DIFFERENTIAL/PLATELET
Basophils Absolute: 0.1 10*3/uL (ref 0–0.1)
Basophils Relative: 1 %
EOS ABS: 0.4 10*3/uL (ref 0–0.7)
EOS PCT: 4 %
HCT: 41.2 % (ref 40.0–52.0)
Hemoglobin: 12.8 g/dL — ABNORMAL LOW (ref 13.0–18.0)
LYMPHS ABS: 1.4 10*3/uL (ref 1.0–3.6)
Lymphocytes Relative: 12 %
MCH: 22.4 pg — AB (ref 26.0–34.0)
MCHC: 31 g/dL — AB (ref 32.0–36.0)
MCV: 72.3 fL — ABNORMAL LOW (ref 80.0–100.0)
Monocytes Absolute: 0.4 10*3/uL (ref 0.2–1.0)
Monocytes Relative: 4 %
Neutro Abs: 8.8 10*3/uL — ABNORMAL HIGH (ref 1.4–6.5)
Neutrophils Relative %: 79 %
PLATELETS: 262 10*3/uL (ref 150–440)
RBC: 5.7 MIL/uL (ref 4.40–5.90)
RDW: 16.9 % — AB (ref 11.5–14.5)
WBC: 11.1 10*3/uL — AB (ref 3.8–10.6)

## 2015-10-20 ENCOUNTER — Inpatient Hospital Stay (HOSPITAL_BASED_OUTPATIENT_CLINIC_OR_DEPARTMENT_OTHER): Payer: Managed Care, Other (non HMO) | Admitting: Internal Medicine

## 2015-10-20 ENCOUNTER — Inpatient Hospital Stay: Payer: Managed Care, Other (non HMO)

## 2015-10-20 ENCOUNTER — Inpatient Hospital Stay: Payer: Managed Care, Other (non HMO) | Attending: Internal Medicine

## 2015-10-20 VITALS — BP 141/91 | HR 97 | Temp 97.1°F | Ht 69.0 in | Wt 197.8 lb

## 2015-10-20 VITALS — BP 123/85 | HR 88

## 2015-10-20 DIAGNOSIS — D45 Polycythemia vera: Secondary | ICD-10-CM | POA: Diagnosis present

## 2015-10-20 DIAGNOSIS — Z79899 Other long term (current) drug therapy: Secondary | ICD-10-CM

## 2015-10-20 DIAGNOSIS — K219 Gastro-esophageal reflux disease without esophagitis: Secondary | ICD-10-CM | POA: Diagnosis not present

## 2015-10-20 DIAGNOSIS — Z7982 Long term (current) use of aspirin: Secondary | ICD-10-CM | POA: Insufficient documentation

## 2015-10-20 DIAGNOSIS — D751 Secondary polycythemia: Secondary | ICD-10-CM

## 2015-10-20 DIAGNOSIS — M109 Gout, unspecified: Secondary | ICD-10-CM | POA: Diagnosis not present

## 2015-10-20 LAB — CBC WITH DIFFERENTIAL/PLATELET
Basophils Absolute: 0.1 10*3/uL (ref 0–0.1)
Basophils Relative: 1 %
Eosinophils Absolute: 0.4 10*3/uL (ref 0–0.7)
Eosinophils Relative: 4 %
HEMATOCRIT: 42.1 % (ref 40.0–52.0)
Hemoglobin: 13 g/dL (ref 13.0–18.0)
LYMPHS ABS: 1.2 10*3/uL (ref 1.0–3.6)
MCH: 22.2 pg — ABNORMAL LOW (ref 26.0–34.0)
MCHC: 30.7 g/dL — AB (ref 32.0–36.0)
MCV: 72.2 fL — AB (ref 80.0–100.0)
MONO ABS: 0.3 10*3/uL (ref 0.2–1.0)
NEUTROS ABS: 9.6 10*3/uL — AB (ref 1.4–6.5)
Neutrophils Relative %: 81 %
Platelets: 286 10*3/uL (ref 150–440)
RBC: 5.83 MIL/uL (ref 4.40–5.90)
RDW: 17 % — AB (ref 11.5–14.5)
WBC: 11.7 10*3/uL — ABNORMAL HIGH (ref 3.8–10.6)

## 2015-10-20 MED ORDER — HYDROXYUREA 500 MG PO CAPS: 500.0000 mg | ORAL_CAPSULE | Freq: Every day | ORAL | Status: DC

## 2015-10-20 NOTE — Progress Notes (Signed)
Md changed dose of Hydrea from 1500 mg to 500 mg a day. A new RX was sent to [facility] for Hydrea 500 mg 1 tablet once daily.  Due to patient being symptomatic today with a headache, patient elected to have a therapeutic phlebotomy.  Hct today is 42.1.

## 2015-10-20 NOTE — Progress Notes (Signed)
[place] OFFICE PROGRESS NOTE  Patient Care Team: No Pcp Per Patient as PCP - General (General Practice)   SUMMARY OF ONCOLOGIC HISTORY: # 2014- POLYCYTHEMIA VERA - JAK-2 Positive; on Hydrea 500mg /day; aspirin 81 mg a day; phlebotomy every 4 weeks  INTERVAL HISTORY: Pleasant 59 year old male patient with above history of a jack 2 positive polycythemia vera is here for follow-up. Patient had a last phlebotomy approximately a month ago for hematocrit greater than 43.  Patient states he has intermittent headache; denies any visual changes. Denies any weight loss. Denies any night sweats. Denies any early satiety.  REVIEW OF SYSTEMS:  A complete 10 point review of system is done which is negative except mentioned above/history of present illness.   PAST MEDICAL HISTORY :  Past Medical History  Diagnosis Date  . GERD (gastroesophageal reflux disease)   . Gout   . Polycythemia vera   . Arthritis   . Pulmonary nodules   . Thoracic spine fracture     compression fracture following a fall  . Low serum testosterone level   . Polycythemia, secondary 06/08/2015    PAST SURGICAL HISTORY :   Past Surgical History  Procedure Laterality Date  . Colonoscopy  2013    FAMILY HISTORY :   Family History  Problem Relation Age of Onset  . Throat cancer      uncle    SOCIAL HISTORY:   Social History  Substance Use Topics  . Smoking status: Never Smoker   . Smokeless tobacco: Not on file  . Alcohol Use: 0.0 oz/week    0 Standard drinks or equivalent per week     Comment: occasional alcohol use    ALLERGIES:  is allergic to penicillin g.  MEDICATIONS:  Current Outpatient Prescriptions  Medication Sig Dispense Refill  . Aspirin (ASPIR-81 PO) Take 1 tablet by mouth 1 day or 1 dose.    . Calcium Carb-Cholecalciferol 600-200 MG-UNIT TABS Take 1 tablet by mouth 1 day or 1 dose.    . cholecalciferol (VITAMIN D) 1000 UNITS tablet Take 1 tablet by mouth 1 day or 1 dose.     . fluticasone (FLONASE) 50 MCG/ACT nasal spray Place 2 sprays into the nose 1 day or 1 dose.    . hydroxyurea (HYDREA) 500 MG capsule Take 1,500 mg by mouth daily.  5  . Multiple Vitamins-Minerals (MULTIVITAMIN ADULT PO) Take 1 tablet by mouth 1 day or 1 dose.     No current facility-administered medications for this visit.    PHYSICAL EXAMINATION: ECOG PERFORMANCE STATUS: 0 - Asymptomatic  BP 141/91 mmHg  Pulse 97  Temp(Src) 97.1 F (36.2 C) (Tympanic)  Ht 5\' 9"  (1.753 m)  Wt 197 lb 12 oz (89.7 kg)  BMI 29.19 kg/m2  Filed Weights   10/20/15 1349  Weight: 197 lb 12 oz (89.7 kg)    GENERAL: Well-nourished well-developed; Alert, no distress and comfortable.  Alone.  EYES: no pallor or icterus OROPHARYNX: no thrush or ulceration; good dentition  NECK: supple, no masses felt LYMPH:  no palpable lymphadenopathy in the cervical, axillary or inguinal regions LUNGS: clear to auscultation and  No wheeze or crackles HEART/CVS: regular rate & rhythm and no murmurs; No lower extremity edema ABDOMEN:abdomen soft, non-tender and normal bowel sounds Musculoskeletal:no cyanosis of digits and no clubbing  PSYCH: alert & oriented x 3 with fluent speech NEURO: no focal motor/sensory deficits SKIN:  no rashes or significant lesions  LABORATORY DATA:  I have reviewed the data  as listed    Component Value Date/Time   NA 140 03/02/2014 0835   K 3.8 03/02/2014 0835   CL 109* 03/02/2014 0835   CO2 26 03/02/2014 0835   GLUCOSE 108* 03/02/2014 0835   BUN 9 03/02/2014 0835   CREATININE 0.99 03/02/2014 0835   CALCIUM 8.6 03/02/2014 0835   PROT 7.0 03/02/2014 0835   ALBUMIN 3.7 03/02/2014 0835   AST 20 03/02/2014 0835   ALT 24 03/02/2014 0835   ALKPHOS 96 03/02/2014 0835   BILITOT 0.6 03/02/2014 0835   GFRNONAA >60 03/02/2014 0835   GFRAA >60 03/02/2014 0835    No results found for: SPEP, UPEP  Lab Results  Component Value Date   WBC 11.7* 10/20/2015   NEUTROABS 9.6* 10/20/2015    HGB 13.0 10/20/2015   HCT 42.1 10/20/2015   MCV 72.2* 10/20/2015   PLT 286 10/20/2015      Chemistry      Component Value Date/Time   NA 140 03/02/2014 0835   K 3.8 03/02/2014 0835   CL 109* 03/02/2014 0835   CO2 26 03/02/2014 0835   BUN 9 03/02/2014 0835   CREATININE 0.99 03/02/2014 0835      Component Value Date/Time   CALCIUM 8.6 03/02/2014 0835   ALKPHOS 96 03/02/2014 0835   AST 20 03/02/2014 0835   ALT 24 03/02/2014 0835   BILITOT 0.6 03/02/2014 0835       RADIOGRAPHIC STUDIES: I have personally reviewed the radiological images as listed and agreed with the findings in the report. No results found.   ASSESSMENT & PLAN:   # Polycythemia vera-jack 2 positive. Hydrea 500 mg/aspirin a day; phlebotomy needing every 4 weeks; target hematocrit has been around 43 for the patient/mostly for symptom control-headache management. Patient tolerating phlebotomy is fairly well. No concerns for any transformation.  I reviewed with the patient the data from the [place] study that showed inferior outcome/high incidence of thromboembolic events in the group of patients where the hematocrit target was above 45 compared to patients with goal hematocrit less than 45.  # I recommend checking hemoglobin and hematocrit every 4 weeks; and plan phlebotomy accordingly/target hematocrit discussed above. Patient agrees with the plan.  All questions were answered. The patient knows to call the clinic with any problems, questions or concerns. No barriers to learning was detected. I spent 25 minutes counseling the patient face to face. The total time spent in the appointment was 30 minutes and more than 50% was on counseling and review of test results     Cammie Sickle, MD 10/20/2015 1:58 PM

## 2015-10-21 ENCOUNTER — Other Ambulatory Visit: Payer: Self-pay | Admitting: Family Medicine

## 2015-10-21 DIAGNOSIS — N50819 Testicular pain, unspecified: Secondary | ICD-10-CM

## 2015-10-26 ENCOUNTER — Ambulatory Visit
Admission: RE | Admit: 2015-10-26 | Discharge: 2015-10-26 | Disposition: A | Discharge status: Home / Self Care | Payer: Managed Care, Other (non HMO) | Source: Ambulatory Visit | Attending: Family Medicine | Admitting: Family Medicine

## 2015-10-26 DIAGNOSIS — N50819 Testicular pain, unspecified: Secondary | ICD-10-CM | POA: Diagnosis not present

## 2015-10-27 ENCOUNTER — Inpatient Hospital Stay: Payer: Managed Care, Other (non HMO)

## 2015-11-24 ENCOUNTER — Inpatient Hospital Stay: Payer: Managed Care, Other (non HMO) | Attending: Internal Medicine

## 2015-11-24 ENCOUNTER — Inpatient Hospital Stay: Payer: Managed Care, Other (non HMO)

## 2015-11-24 DIAGNOSIS — Z79899 Other long term (current) drug therapy: Secondary | ICD-10-CM | POA: Insufficient documentation

## 2015-11-24 DIAGNOSIS — D45 Polycythemia vera: Secondary | ICD-10-CM | POA: Diagnosis not present

## 2015-11-24 DIAGNOSIS — D751 Secondary polycythemia: Secondary | ICD-10-CM

## 2015-11-24 LAB — HEMOGLOBIN: HEMOGLOBIN: 12 g/dL — AB (ref 13.0–18.0)

## 2015-11-24 LAB — HEMATOCRIT: HCT: 40.2 % (ref 40.0–52.0)

## 2015-12-22 ENCOUNTER — Inpatient Hospital Stay: Payer: Managed Care, Other (non HMO)

## 2015-12-22 ENCOUNTER — Inpatient Hospital Stay: Payer: Managed Care, Other (non HMO) | Attending: Internal Medicine

## 2015-12-22 DIAGNOSIS — D45 Polycythemia vera: Secondary | ICD-10-CM | POA: Insufficient documentation

## 2015-12-22 DIAGNOSIS — D751 Secondary polycythemia: Secondary | ICD-10-CM

## 2015-12-22 LAB — HEMOGLOBIN: Hemoglobin: 12.6 g/dL — ABNORMAL LOW (ref 13.0–18.0)

## 2015-12-22 LAB — HEMATOCRIT: HCT: 41.3 % (ref 40.0–52.0)

## 2016-01-19 ENCOUNTER — Inpatient Hospital Stay: Payer: Managed Care, Other (non HMO)

## 2016-01-19 ENCOUNTER — Inpatient Hospital Stay: Payer: Managed Care, Other (non HMO) | Attending: Internal Medicine

## 2016-01-19 DIAGNOSIS — D45 Polycythemia vera: Secondary | ICD-10-CM | POA: Insufficient documentation

## 2016-01-19 DIAGNOSIS — D751 Secondary polycythemia: Secondary | ICD-10-CM

## 2016-01-19 LAB — HEMOGLOBIN: Hemoglobin: 12.7 g/dL — ABNORMAL LOW (ref 13.0–18.0)

## 2016-01-19 LAB — HEMATOCRIT: HEMATOCRIT: 40.6 % (ref 40.0–52.0)

## 2016-02-16 ENCOUNTER — Inpatient Hospital Stay: Payer: Managed Care, Other (non HMO)

## 2016-02-16 ENCOUNTER — Inpatient Hospital Stay: Payer: Managed Care, Other (non HMO) | Attending: Internal Medicine

## 2016-02-16 DIAGNOSIS — D45 Polycythemia vera: Secondary | ICD-10-CM | POA: Insufficient documentation

## 2016-02-16 DIAGNOSIS — D751 Secondary polycythemia: Secondary | ICD-10-CM

## 2016-02-16 LAB — HEMATOCRIT: HEMATOCRIT: 41.7 % (ref 40.0–52.0)

## 2016-02-16 LAB — HEMOGLOBIN: HEMOGLOBIN: 12.8 g/dL — AB (ref 13.0–18.0)

## 2016-03-15 ENCOUNTER — Inpatient Hospital Stay: Payer: Managed Care, Other (non HMO)

## 2016-03-15 DIAGNOSIS — D751 Secondary polycythemia: Secondary | ICD-10-CM

## 2016-03-15 DIAGNOSIS — D45 Polycythemia vera: Secondary | ICD-10-CM | POA: Diagnosis not present

## 2016-03-15 LAB — HEMOGLOBIN: Hemoglobin: 12.9 g/dL — ABNORMAL LOW (ref 13.0–18.0)

## 2016-03-15 LAB — HEMATOCRIT: HCT: 42 % (ref 40.0–52.0)

## 2016-04-18 ENCOUNTER — Inpatient Hospital Stay (HOSPITAL_BASED_OUTPATIENT_CLINIC_OR_DEPARTMENT_OTHER): Payer: Managed Care, Other (non HMO) | Admitting: Internal Medicine

## 2016-04-18 ENCOUNTER — Inpatient Hospital Stay: Payer: Managed Care, Other (non HMO)

## 2016-04-18 ENCOUNTER — Inpatient Hospital Stay: Payer: Managed Care, Other (non HMO) | Attending: Internal Medicine

## 2016-04-18 VITALS — BP 104/73 | HR 84 | Resp 18

## 2016-04-18 VITALS — BP 131/88 | HR 85 | Temp 98.2°F | Resp 20 | Wt 194.2 lb

## 2016-04-18 DIAGNOSIS — Z7982 Long term (current) use of aspirin: Secondary | ICD-10-CM | POA: Diagnosis not present

## 2016-04-18 DIAGNOSIS — M1990 Unspecified osteoarthritis, unspecified site: Secondary | ICD-10-CM | POA: Diagnosis not present

## 2016-04-18 DIAGNOSIS — D751 Secondary polycythemia: Secondary | ICD-10-CM

## 2016-04-18 DIAGNOSIS — K219 Gastro-esophageal reflux disease without esophagitis: Secondary | ICD-10-CM | POA: Insufficient documentation

## 2016-04-18 DIAGNOSIS — R51 Headache: Secondary | ICD-10-CM | POA: Insufficient documentation

## 2016-04-18 DIAGNOSIS — Z79899 Other long term (current) drug therapy: Secondary | ICD-10-CM | POA: Diagnosis not present

## 2016-04-18 DIAGNOSIS — D45 Polycythemia vera: Secondary | ICD-10-CM | POA: Insufficient documentation

## 2016-04-18 DIAGNOSIS — M109 Gout, unspecified: Secondary | ICD-10-CM | POA: Diagnosis not present

## 2016-04-18 LAB — CBC WITH DIFFERENTIAL/PLATELET
Basophils Absolute: 0.1 10*3/uL (ref 0–0.1)
Basophils Relative: 1 %
Eosinophils Absolute: 0.6 10*3/uL (ref 0–0.7)
Eosinophils Relative: 5 %
HEMATOCRIT: 43.6 % (ref 40.0–52.0)
HEMOGLOBIN: 13.4 g/dL (ref 13.0–18.0)
LYMPHS ABS: 1.2 10*3/uL (ref 1.0–3.6)
Lymphocytes Relative: 9 %
MCH: 22.3 pg — AB (ref 26.0–34.0)
MCHC: 30.8 g/dL — AB (ref 32.0–36.0)
MCV: 72.4 fL — AB (ref 80.0–100.0)
Monocytes Absolute: 0.4 10*3/uL (ref 0.2–1.0)
NEUTROS ABS: 11.3 10*3/uL — AB (ref 1.4–6.5)
Platelets: 407 10*3/uL (ref 150–440)
RBC: 6.02 MIL/uL — ABNORMAL HIGH (ref 4.40–5.90)
RDW: 18.2 % — AB (ref 11.5–14.5)
WBC: 13.6 10*3/uL — ABNORMAL HIGH (ref 3.8–10.6)

## 2016-04-18 LAB — COMPREHENSIVE METABOLIC PANEL
ALK PHOS: 98 U/L (ref 38–126)
ALT: 19 U/L (ref 17–63)
ANION GAP: 7 (ref 5–15)
AST: 25 U/L (ref 15–41)
Albumin: 4.4 g/dL (ref 3.5–5.0)
BILIRUBIN TOTAL: 0.8 mg/dL (ref 0.3–1.2)
BUN: 12 mg/dL (ref 6–20)
CALCIUM: 9 mg/dL (ref 8.9–10.3)
CO2: 27 mmol/L (ref 22–32)
CREATININE: 1 mg/dL (ref 0.61–1.24)
Chloride: 104 mmol/L (ref 101–111)
Glucose, Bld: 91 mg/dL (ref 65–99)
Potassium: 4.2 mmol/L (ref 3.5–5.1)
Sodium: 138 mmol/L (ref 135–145)
TOTAL PROTEIN: 7.3 g/dL (ref 6.5–8.1)

## 2016-04-18 LAB — LACTATE DEHYDROGENASE: LDH: 191 U/L (ref 98–192)

## 2016-04-18 MED ORDER — HYDROXYUREA 500 MG PO CAPS: 500.0000 mg | ORAL_CAPSULE | Freq: Every day | ORAL | Status: DC

## 2016-04-18 NOTE — Progress Notes (Signed)
[facility] OFFICE PROGRESS NOTE  Patient Care Team: Dion Body, MD as PCP - General (Family Medicine)   SUMMARY OF ONCOLOGIC HISTORY: # 2014- POLYCYTHEMIA VERA - JAK-2 Positive; on Hydrea 500mg /day; aspirin 81 mg a day; phlebotomy for Hct >43 [headaches]  INTERVAL HISTORY: Pleasant 60 year old male patient with above history of a jack 2 positive polycythemia vera is here for follow-up. Patient had a last phlebotomy approximately a month ago for hematocrit greater than 43.  Patient is having headaches in the last few days. Denies any fatigue or night sweats or unusual weight loss. He lost about 3 pounds intentional weight.  REVIEW OF SYSTEMS:  A complete 10 point review of system is done which is negative except mentioned above/history of present illness.   PAST MEDICAL HISTORY :  Past Medical History  Diagnosis Date  . GERD (gastroesophageal reflux disease)   . Gout   . Polycythemia vera   . Arthritis   . Pulmonary nodules   . Thoracic spine fracture     compression fracture following a fall  . Low serum testosterone level   . Polycythemia, secondary 06/08/2015    PAST SURGICAL HISTORY :   Past Surgical History  Procedure Laterality Date  . Colonoscopy  2013    FAMILY HISTORY :   Family History  Problem Relation Age of Onset  . Throat cancer      uncle    SOCIAL HISTORY:   Social History  Substance Use Topics  . Smoking status: Never Smoker   . Smokeless tobacco: Not on file  . Alcohol Use: 0.0 oz/week    0 Standard drinks or equivalent per week     Comment: occasional alcohol use    ALLERGIES:  is allergic to penicillin g.  MEDICATIONS:  Current Outpatient Prescriptions  Medication Sig Dispense Refill  . aspirin EC 81 MG tablet Take by mouth.    . Calcium Carb-Cholecalciferol 600-200 MG-UNIT TABS Take 1 tablet by mouth 1 day or 1 dose.    . Cholecalciferol (VITAMIN D-1000 MAX ST) 1000 units tablet Take by mouth.    . fluticasone  (FLONASE) 50 MCG/ACT nasal spray     . hydroxyurea (HYDREA) 500 MG capsule Take 1 capsule (500 mg total) by mouth daily. 30 capsule 5  . Multiple Vitamins-Minerals (MULTIVITAMIN ADULT PO) Take 1 tablet by mouth 1 day or 1 dose.     No current facility-administered medications for this visit.    PHYSICAL EXAMINATION: ECOG PERFORMANCE STATUS: 0 - Asymptomatic  BP 131/88 mmHg  Pulse 85  Temp(Src) 98.2 F (36.8 C) (Tympanic)  Wt 194 lb 3.6 oz (88.1 kg)  Filed Weights   04/18/16 1333  Weight: 194 lb 3.6 oz (88.1 kg)    GENERAL: Well-nourished well-developed; Alert, no distress and comfortable.  Alone.  EYES: no pallor or icterus OROPHARYNX: no thrush or ulceration; good dentition  NECK: supple, no masses felt LYMPH:  no palpable lymphadenopathy in the cervical, axillary or inguinal regions LUNGS: clear to auscultation and  No wheeze or crackles HEART/CVS: regular rate & rhythm and no murmurs; No lower extremity edema ABDOMEN:abdomen soft, non-tender and normal bowel sounds Musculoskeletal:no cyanosis of digits and no clubbing  PSYCH: alert & oriented x 3 with fluent speech NEURO: no focal motor/sensory deficits SKIN:  no rashes or significant lesions  LABORATORY DATA:  I have reviewed the data as listed    Component Value Date/Time   NA 138 04/18/2016 1307   NA 140 03/02/2014 0835  K 4.2 04/18/2016 1307   K 3.8 03/02/2014 0835   CL 104 04/18/2016 1307   CL 109* 03/02/2014 0835   CO2 27 04/18/2016 1307   CO2 26 03/02/2014 0835   GLUCOSE 91 04/18/2016 1307   GLUCOSE 108* 03/02/2014 0835   BUN 12 04/18/2016 1307   BUN 9 03/02/2014 0835   CREATININE 1.00 04/18/2016 1307   CREATININE 0.99 03/02/2014 0835   CALCIUM 9.0 04/18/2016 1307   CALCIUM 8.6 03/02/2014 0835   PROT 7.3 04/18/2016 1307   PROT 7.0 03/02/2014 0835   ALBUMIN 4.4 04/18/2016 1307   ALBUMIN 3.7 03/02/2014 0835   AST 25 04/18/2016 1307   AST 20 03/02/2014 0835   ALT 19 04/18/2016 1307   ALT 24  03/02/2014 0835   ALKPHOS 98 04/18/2016 1307   ALKPHOS 96 03/02/2014 0835   BILITOT 0.8 04/18/2016 1307   BILITOT 0.6 03/02/2014 0835   GFRNONAA >60 04/18/2016 1307   GFRNONAA >60 03/02/2014 0835   GFRAA >60 04/18/2016 1307   GFRAA >60 03/02/2014 0835    No results found for: SPEP, UPEP  Lab Results  Component Value Date   WBC 13.6* 04/18/2016   NEUTROABS 11.3* 04/18/2016   HGB 13.4 04/18/2016   HCT 43.6 04/18/2016   MCV 72.4* 04/18/2016   PLT 407 04/18/2016      Chemistry      Component Value Date/Time   NA 138 04/18/2016 1307   NA 140 03/02/2014 0835   K 4.2 04/18/2016 1307   K 3.8 03/02/2014 0835   CL 104 04/18/2016 1307   CL 109* 03/02/2014 0835   CO2 27 04/18/2016 1307   CO2 26 03/02/2014 0835   BUN 12 04/18/2016 1307   BUN 9 03/02/2014 0835   CREATININE 1.00 04/18/2016 1307   CREATININE 0.99 03/02/2014 0835      Component Value Date/Time   CALCIUM 9.0 04/18/2016 1307   CALCIUM 8.6 03/02/2014 0835   ALKPHOS 98 04/18/2016 1307   ALKPHOS 96 03/02/2014 0835   AST 25 04/18/2016 1307   AST 20 03/02/2014 0835   ALT 19 04/18/2016 1307   ALT 24 03/02/2014 0835   BILITOT 0.8 04/18/2016 1307   BILITOT 0.6 03/02/2014 0835      ASSESSMENT & PLAN:  # Polycythemia vera-jack 2 positive. Hydrea 500 mg/aspirin a day; Patient's last phlebotomy was approximately 6 months ago.  No concerns for any transformation. No evidence of any thrombo-embolic events.   Target hematocrit has been around 43 for the patient/mostly for symptom control-headache management. Patient has headaches if hematocrit greater than 43.   # H&H every 2 months/ possible phlebotomy; follow up with MD in 6 months/possible phlebotomy. Prescription for Hydrea was given.    Cammie Sickle, MD 04/18/2016 1:45 PM

## 2016-04-23 ENCOUNTER — Ambulatory Visit: Payer: Managed Care, Other (non HMO) | Admitting: Neurology

## 2016-04-25 ENCOUNTER — Ambulatory Visit (INDEPENDENT_AMBULATORY_CARE_PROVIDER_SITE_OTHER): Payer: Managed Care, Other (non HMO) | Admitting: Neurology

## 2016-04-25 ENCOUNTER — Telehealth: Payer: Self-pay | Admitting: Neurology

## 2016-04-25 ENCOUNTER — Ambulatory Visit
Admission: RE | Admit: 2016-04-25 | Discharge: 2016-04-25 | Disposition: A | Discharge status: Home / Self Care | Payer: Managed Care, Other (non HMO) | Source: Ambulatory Visit | Attending: Neurology | Admitting: Neurology

## 2016-04-25 ENCOUNTER — Encounter: Payer: Self-pay | Admitting: Neurology

## 2016-04-25 VITALS — BP 146/90 | HR 88 | Ht 69.0 in | Wt 191.0 lb

## 2016-04-25 DIAGNOSIS — H05111 Granuloma of right orbit: Secondary | ICD-10-CM

## 2016-04-25 DIAGNOSIS — H468 Other optic neuritis: Secondary | ICD-10-CM | POA: Diagnosis not present

## 2016-04-25 DIAGNOSIS — H469 Unspecified optic neuritis: Secondary | ICD-10-CM

## 2016-04-25 DIAGNOSIS — H53131 Sudden visual loss, right eye: Secondary | ICD-10-CM

## 2016-04-25 DIAGNOSIS — H4710 Unspecified papilledema: Secondary | ICD-10-CM

## 2016-04-25 MED ORDER — GADOBENATE DIMEGLUMINE 529 MG/ML IV SOLN
20.0000 mL | Freq: Once | INTRAVENOUS | Status: AC | PRN
  Administered 2016-04-25: 20 mL via INTRAVENOUS

## 2016-04-25 NOTE — Patient Instructions (Signed)
Overall you are doing fairly well but I do want to suggest a few things today:   Remember to drink plenty of fluid, eat healthy meals and do not skip any meals. Try to eat protein with a every meal and eat a healthy snack such as fruit or nuts in between meals. Try to keep a regular sleep-wake schedule and try to exercise daily, particularly in the form of walking, 20-30 minutes a day, if you can.   As far as your medications are concerned, I would like to suggest  As far as diagnostic testing: Images today, ophthalmology referral, labs, evoked potentials  I would like to see you back after workup 4 weeks, sooner if we need to. Please call us with any interim questions, concerns, problems, updates or refill requests.   Please also call us for any test results so we can go over those with you on the phone.  My clinical assistant and will answer any of your questions and relay your messages to me and also relay most of my messages to you.   Our phone number is [phone]. We also have an after hours call service for urgent matters and there is a physician on-call for urgent questions. For any emergencies you know to call 911 or go to the nearest emergency room

## 2016-04-25 NOTE — Progress Notes (Addendum)
[facility]    Provider:  Dr Jaynee Eagles Referring Provider: Dion Body, MD Primary Care Physician:  Dion Body, MD  CC:  Right eye problems  HPI:  Derek [name] is a 60 y.o. male here as a referral from Dr. Netty Starring for right eye vision changes. PMHx of polycythemia vera(POLYCYTHEMIA VERA - JAK-2 Positive; on Hydrea 585m/day; aspirin 81 mg a day and phlebotomy).  Started with some black spots last week flashing. 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 His right eye is now hazy like someone smeared vaseline on his eye. Just the right eye is affected. He is also having headaches. The headaches are chronic, their frequency or quality or severity has not changed. He has had a few headaches since last week. His neck is very tight. He is having some left arm tingling unknown association with the vision or not. No pain on movement. The whole eye is having blurriness but clearer if he looks down and up, feels like one spot has vaseline on it. He has seen this optometrist in the past and this finding is new, swelling in the back of the right eye. No fevers, no jaw pain. No recent illnesses. If he closes his left eye he sees a spot of hazing in the right eye. His eye is bloodshot on the right. No association or worsening of symptoms with time of day or position. No double vision. No FHx autoimmune disorders or MS. He takes daily daily asa 864m The headaches since last week feel like a tension headache, moves around, tightness in the left frontal area it moves around. The same quality of headache he has always had. No fevers. No pain on movement. 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 This is a new finding however, exams in the past showed normal fundi.  MRI of the brain: On sagittal images, the spinal cord is imaged caudally to C4 and is normal in caliber. The contents of the posterior fossa are of normal size and position. The pituitary gland and optic chiasm appear normal. Brain volume appears normal. The ventricles are normal in size and without distortion. There are no abnormal extra-axial collections of fluid.   The cerebellum and brainstem appears normal. The deep gray matter appears normal. The cerebral hemispheres appear normal. The orbits appear normal. The VIIth/VIIIth nerve complex appears normal. The mastoid air cells appear normal. There is a small retention cyst in the right maxillary sinus. The other paranasal sinuses appear normal. Flow voids are identified within the major intracerebral arteries.   Diffusion weighted images are normal. Heme weighted gradient echo images are normal. After the infusion of contrast material, a normal enhancement pattern is noted.  IMPRESSION: This is a normal MRI of the brain with and without contrast.   FINDINGS: The globes, optic nerves and extraocular muscles demonstrate normal appearing anatomy. The intraconal space and the vascular structures appear normal. The optic chiasm and sella region appear normal. No abnormal enhancing, inflammatory or compressive lesions are seen. Limited views of the brain parenchyma are unremarkable. Minimal chronic inflammatory changes are noted in the right maxillary sinus.   IMPRESSION: This is a normal MRI of the orbits  with and without contrast   Review of Systems: Patient complains of symptoms per HPI as well as the following symptoms: no CP, no SOB, no fevers or systemic signs. Pertinent negatives per HPI. All others negative.   Social History   Social History  . Marital Status:  Divorced    Spouse Name: N/A  . Number of Children: 2  . Years of Education: 12   Occupational History  . [employer]    Social History Main Topics  . Smoking status: Never Smoker   . Smokeless tobacco: Not on file  . Alcohol Use: No     Comment: occasional alcohol use  . Drug Use: No  . Sexual Activity: Not on file   Other Topics Concern  . Not on file   Social History Narrative   Lives alone   Caffeine use:  Very little per pt   1 glass tea.day    Family History  Problem Relation Age of Onset  . Throat cancer      uncle  . Migraines Neg Hx   . Multiple sclerosis Neg Hx   . Neurofibromatosis Neg Hx   . Parkinsonism Neg Hx   . Seizures Neg Hx   . Neuropathy Neg Hx     Past Medical History  Diagnosis Date  . GERD (gastroesophageal reflux disease)   . Gout   . Polycythemia vera ([place])   . Arthritis   . Pulmonary nodules   . Thoracic spine fracture ([facility])     compression fracture following a fall  . Low serum testosterone level   . Polycythemia, secondary 06/08/2015    Past Surgical History  Procedure Laterality Date  . Colonoscopy  2013    Current Outpatient Prescriptions  Medication Sig Dispense Refill  . aspirin EC 81 MG tablet Take by mouth.    . Cholecalciferol (VITAMIN D-1000 MAX ST) 1000 units tablet Take by mouth.    . Esomeprazole Magnesium (NEXIUM PO) Take 1 capsule by mouth daily.    . fluticasone (FLONASE) 50 MCG/ACT nasal spray     . hydroxyurea (HYDREA) 500 MG capsule Take 1 capsule (500 mg total) by mouth daily. 30 capsule 5  . Multiple Vitamins-Minerals (MULTIVITAMIN ADULT PO) Take 1 tablet by mouth 1 day or 1 dose.     No current facility-administered medications for this visit.    Allergies as of 04/25/2016 - Review Complete 04/25/2016  Allergen Reaction Noted  . Penicillin g  04/13/2015    Vitals: BP 146/90 mmHg  Pulse 88  Ht '5\' 9"'  (1.753 m)  Wt 191 lb (86.637 kg)  BMI 28.19 kg/m2 Last Weight:  Wt Readings from Last 1  Encounters:  04/25/16 191 lb (86.637 kg)   Last Height:   Ht Readings from Last 1 Encounters:  04/25/16 '5\' 9"'  (1.753 m)    Physical exam: Exam: Gen: NAD, conversant, well nourised, well groomed                     CV: RRR, no MRG. No Carotid Bruits. No peripheral edema, warm, nontender Eyes: Conjunctivae clear without exudates or hemorrhage, no injection noted, no proptosis  Neuro: Detailed Neurologic Exam  Speech:    Speech is normal; fluent and spontaneous with normal comprehension.  Cognition:    The patient is oriented to person, place, and time;     recent and remote memory intact;     language fluent;     normal attention, concentration,     fund of  knowledge Cranial Nerves:    The pupils are equal, round, and reactive to light. Right optic disk edema . No APD.  Visual fields are full to finger confrontation. Extraocular movements are intact. Trigeminal sensation is intact and the muscles of mastication are normal. The face is symmetric. The palate elevates in the midline. Hearing intact. Voice is normal. Shoulder shrug is normal. The tongue has normal motion without fasciculations.    VA: 20/30 OD, 20/20 OS  Coordination:    Normal finger to nose and heel to shin. Normal rapid alternating movements.   Gait:    Heel-toe and tandem gait are normal.   Motor Observation:    No asymmetry, no atrophy, and no involuntary movements noted. Tone:    Normal muscle tone.    Posture:    Posture is normal. normal erect    Strength:    Strength is V/V in the upper and lower limbs.      Sensation: intact to LT     Reflex Exam:  DTR's:    Deep tendon reflexes in the upper and lower extremities are normal bilaterally.   Toes:    The toes are downgoing bilaterally.   Clonus:    Clonus is absent.      Assessment/Plan:  60 year old with acute onset right vision loss, right optic disk edema, headache. Neuro exam with significant right optic disk edema but vision normal.  No injection or proptosis noted, no ptosis, perrl, eomi. DDx includes ischemic, compressive, inflammatory, vasculitic, infectious causes, metabolic/toxic  Eval with [facility] asap MRI of the brain and orbits stat w/wo contrast (results normal, no optic nerve enhancement, no lesions, no signs of intracranial hypertension) Esr/crp, other labs (all normal: crp, esr, ana, rpr, bartonella, varicella, lyme, b12, folate, b6, b1) Evoked potentials pending Continue daily ASA  Addendum: Patient evaluated by Dr. Katy Fitch, cataract both eyes, ischemic optic neuropathy OD i.e. stroke of the nerve and vision loss as permanent. They discussed risk of possibility OS, he is ever started on blood pressure meds should take them in the morning, recommended sleep study. Sarina Ill, MD  [facility] [address] [place], [place] [address]  Phone [phone] Fax [phone]

## 2016-04-25 NOTE — Telephone Encounter (Signed)
I won't have MRI results until tomorrow at the earliest possibly not until Friday. He just had it done today, I will get it to him as soon as possible thanks

## 2016-04-25 NOTE — Telephone Encounter (Signed)
Derek [name], MRI of the brain and orbits were normal, thanks.   Hinton Dyer, can you see if we can get Derek [name] in to see Dr. Katy Fitch? My note is complete enough to send thanks

## 2016-04-25 NOTE — Telephone Encounter (Signed)
Pt called request MRI results. Pt is very anxious

## 2016-04-26 ENCOUNTER — Telehealth: Payer: Self-pay | Admitting: Neurology

## 2016-04-26 NOTE — Telephone Encounter (Signed)
Derek [name], can you call patient and let him know that Dr. Katy Fitch the ophthalmologist has been trying to reach him? Give him Dr. Zenia Resides number and make sure he calls tomorrow for appointment? thanks

## 2016-04-26 NOTE — Telephone Encounter (Signed)
Called and spoke to pt about normal MRI brain and orbits. Pt verbalized understanding. Advised her should hear within a week or two to schedule appt with Dr Katy Fitch. Told him to call me back if he does not hear within that time frame so we can f/u and make sure he gets appt. He verbalized understanding.

## 2016-04-26 NOTE — Telephone Encounter (Signed)
Derek [name]/Dr Katy Fitch [phone] has tried calling the pt several time with no success of reaching him. FYI for you

## 2016-04-26 NOTE — Telephone Encounter (Signed)
Called pt. LVM that [facility] from Dr Katy Fitch office was trying to call and schedule appt. They cannot reach him. Gave him phone number to call and schedule. Told him to call if he has further questions.

## 2016-04-27 ENCOUNTER — Other Ambulatory Visit: Payer: Self-pay | Admitting: Neurology

## 2016-04-27 ENCOUNTER — Encounter: Payer: Self-pay | Admitting: Neurology

## 2016-04-27 DIAGNOSIS — H47011 Ischemic optic neuropathy, right eye: Secondary | ICD-10-CM

## 2016-04-27 NOTE — Telephone Encounter (Signed)
Patient returned Emma's call, couldn't make out her message. Advised Dr. Zenia Resides office tried to reach him to schedule appointment. Gave patient phone number to Dr. Zenia Resides office.

## 2016-04-30 ENCOUNTER — Telehealth: Payer: Self-pay | Admitting: Neurology

## 2016-04-30 ENCOUNTER — Telehealth: Payer: Self-pay

## 2016-04-30 NOTE — Telephone Encounter (Signed)
-----   Message from Melvenia Beam, MD sent at 04/27/2016  5:19 PM EDT ----- Labs look fine thanks

## 2016-04-30 NOTE — Telephone Encounter (Signed)
-----   Message from Melvenia Beam, MD sent at 04/27/2016  1:46 PM EDT ----- Labs look good thanks

## 2016-04-30 NOTE — Telephone Encounter (Signed)
LM that labs are normal. Vit B levels were high, if taking Vit B supplement, he can stop and recheck with PCP. Left call back # for further questions.

## 2016-04-30 NOTE — Telephone Encounter (Signed)
Lauren with Dr. Lennette Bihari Ritter's office called reqeusting office notes from last visit. Please call [phone], Fax: [phone]

## 2016-05-01 LAB — RPR: RPR Ser Ql: NONREACTIVE

## 2016-05-01 LAB — VITAMIN B1: Thiamine: 328.9 nmol/L — ABNORMAL HIGH (ref 66.5–200.0)

## 2016-05-01 LAB — BARTONELLA ANITBODY PANEL
B HENSELAE IGM: NEGATIVE {titer}
B QUINTANA IGG: NEGATIVE {titer}
B QUINTANA IGM: NEGATIVE {titer}

## 2016-05-01 LAB — B12 AND FOLATE PANEL
Folate: >20 ng/mL (ref >3.0)
Vitamin B-12: 1623 pg/mL — ABNORMAL HIGH (ref 211–946)

## 2016-05-01 LAB — BARTONELLA ANTIBODY PANEL: B henselae IgG: NEGATIVE titer

## 2016-05-01 LAB — C-REACTIVE PROTEIN: CRP: 2.1 mg/L (ref 0.0–4.9)

## 2016-05-01 LAB — VARICELLA ZOSTER ANTIBODY, IGG: Varicella zoster IgG: 1167 index (ref >165)

## 2016-05-01 LAB — ANGIOTENSIN CONVERTING ENZYME: Angio Convert Enzyme: 57 U/L (ref 14–82)

## 2016-05-01 LAB — ANA W/REFLEX: Anti Nuclear Antibody(ANA): NEGATIVE

## 2016-05-01 LAB — LYME AB/WESTERN BLOT REFLEX

## 2016-05-01 LAB — VARICELLA ZOSTER ANTIBODY, IGM

## 2016-05-01 LAB — METHYLMALONIC ACID, SERUM: METHYLMALONIC ACID: 135 nmol/L (ref 0–378)

## 2016-05-01 LAB — VITAMIN B6: Vitamin B6: 12.1 ug/L (ref 5.3–46.7)

## 2016-05-01 LAB — SEDIMENTATION RATE: SED RATE: 5 mm/h (ref 0–30)

## 2016-05-01 NOTE — Telephone Encounter (Signed)
LM referring to message that I left for patient. I stated that Dr. Jaynee Eagles is ok with his B12 being a little high, it is not harmIful and  it is water soluble vitamin and therefore can't really become toxic. If he is taking a supplement, he can continue. Left call back number if further questions.

## 2016-05-21 ENCOUNTER — Ambulatory Visit (INDEPENDENT_AMBULATORY_CARE_PROVIDER_SITE_OTHER): Payer: Managed Care, Other (non HMO) | Admitting: Neurology

## 2016-05-21 DIAGNOSIS — H53131 Sudden visual loss, right eye: Secondary | ICD-10-CM

## 2016-05-21 DIAGNOSIS — H469 Unspecified optic neuritis: Secondary | ICD-10-CM

## 2016-05-21 DIAGNOSIS — H05111 Granuloma of right orbit: Secondary | ICD-10-CM

## 2016-05-21 DIAGNOSIS — H4710 Unspecified papilledema: Secondary | ICD-10-CM

## 2016-05-21 NOTE — Procedures (Signed)
    History:   Derek [name] is a 60 year old patient with a history of sudden onset visual loss in the right eye. The patient is felt to have right optic disks edema and associated headache. The patient being evaluated for this visual change.    Description: The visual evoked response test was performed today using 32 x 32 check sizes. The absolute latencies for the N1 and the P100 wave forms were within normal limits bilaterally. The amplitudes for the P100 wave forms were also within normal limits bilaterally. The visual acuity was 20/30 OD and 20/20 OS corrected.  Impression:  The visual evoked response test above was within normal limits bilaterally. No evidence of conduction slowing was seen within the anterior visual pathways on either side on today's evaluation.

## 2016-05-22 ENCOUNTER — Telehealth: Payer: Self-pay | Admitting: *Deleted

## 2016-05-22 NOTE — Telephone Encounter (Signed)
LVM for pt to call about results. Gave [facility] phone number.  

## 2016-05-22 NOTE — Telephone Encounter (Signed)
-----   Message from Melvenia Beam, MD sent at 05/21/2016  9:18 PM EDT ----- Patient's visual evoked potentials are normal. This means his optic nerves are normal. I received the notes from Dr. Katy Fitch, I hope he is feeling better.

## 2016-05-23 NOTE — Telephone Encounter (Signed)
Patient returned Emma's call. Didn't listen to voicemail before calling, will check voicemail and will call back if anything further is needed.

## 2016-05-23 NOTE — Telephone Encounter (Signed)
Pt returned call. Pt said you may leave message if he does not answer.

## 2016-05-23 NOTE — Telephone Encounter (Signed)
LVM for pt about results. Gave [facility] phone number if he has further questions.

## 2016-05-24 ENCOUNTER — Other Ambulatory Visit: Payer: Self-pay | Admitting: *Deleted

## 2016-05-24 NOTE — Telephone Encounter (Signed)
Has refills

## 2016-06-17 ENCOUNTER — Other Ambulatory Visit: Payer: Self-pay | Admitting: Neurology

## 2016-06-17 DIAGNOSIS — G4733 Obstructive sleep apnea (adult) (pediatric): Secondary | ICD-10-CM

## 2016-06-20 ENCOUNTER — Inpatient Hospital Stay: Payer: Managed Care, Other (non HMO)

## 2016-06-20 ENCOUNTER — Inpatient Hospital Stay: Payer: Managed Care, Other (non HMO) | Attending: Internal Medicine

## 2016-06-20 DIAGNOSIS — D751 Secondary polycythemia: Secondary | ICD-10-CM

## 2016-06-20 LAB — HEMOGLOBIN: HEMOGLOBIN: 13 g/dL (ref 13.0–18.0)

## 2016-06-20 LAB — HEMATOCRIT: HEMATOCRIT: 41.8 % (ref 40.0–52.0)

## 2016-08-13 ENCOUNTER — Telehealth: Payer: Self-pay | Admitting: *Deleted

## 2016-08-13 NOTE — Telephone Encounter (Signed)
-----   Message from Cephus Richer sent at 08/13/2016  1:33 PM EDT ----- Contact: [phone] Per pt head is hurting really bad would like to get lab work done today.

## 2016-08-13 NOTE — Telephone Encounter (Signed)
-----   Message from Cephus Richer sent at 08/13/2016  1:33 PM EDT ----- Contact: [phone] Per pt head is hurting really bad would like to get lab work done today.

## 2016-08-13 NOTE — Telephone Encounter (Signed)
Spoke with md-have patient move up lab/phlebotomy to next week.

## 2016-08-13 NOTE — Telephone Encounter (Signed)
Per Dr Rogue Bussing, move 9/3 appts for lab possible phlebotomy up to this week. Call patient with appt

## 2016-08-14 ENCOUNTER — Other Ambulatory Visit: Payer: Self-pay

## 2016-08-14 ENCOUNTER — Inpatient Hospital Stay: Payer: Managed Care, Other (non HMO)

## 2016-08-14 ENCOUNTER — Inpatient Hospital Stay: Payer: Managed Care, Other (non HMO) | Attending: Internal Medicine

## 2016-08-14 VITALS — BP 147/80 | HR 84 | Temp 97.5°F | Resp 18

## 2016-08-14 DIAGNOSIS — D751 Secondary polycythemia: Secondary | ICD-10-CM | POA: Diagnosis present

## 2016-08-14 DIAGNOSIS — D45 Polycythemia vera: Secondary | ICD-10-CM

## 2016-08-14 LAB — HEMOGLOBIN: Hemoglobin: 13.5 g/dL (ref 13.0–18.0)

## 2016-08-14 LAB — HEMATOCRIT: HCT: 43.8 % (ref 40.0–52.0)

## 2016-08-15 ENCOUNTER — Telehealth: Payer: Self-pay | Admitting: *Deleted

## 2016-08-15 NOTE — Telephone Encounter (Signed)
His employer is wanting him to get the shingles vaccine, he is asking if it is alright to get it being on Hydrea. Per Dr Rogue Bussing, OK to get shingles vaccine. Patient informed ok to get shingles vaccine. Left message on VM

## 2016-08-22 ENCOUNTER — Inpatient Hospital Stay: Payer: Managed Care, Other (non HMO)

## 2016-10-01 ENCOUNTER — Encounter: Payer: Self-pay | Admitting: *Deleted

## 2016-10-01 ENCOUNTER — Telehealth: Payer: Self-pay | Admitting: *Deleted

## 2016-10-01 ENCOUNTER — Inpatient Hospital Stay: Payer: Managed Care, Other (non HMO) | Attending: Emergency Medicine

## 2016-10-01 ENCOUNTER — Other Ambulatory Visit: Payer: Self-pay

## 2016-10-01 DIAGNOSIS — D751 Secondary polycythemia: Secondary | ICD-10-CM | POA: Insufficient documentation

## 2016-10-01 DIAGNOSIS — D45 Polycythemia vera: Secondary | ICD-10-CM

## 2016-10-01 LAB — CBC WITH DIFFERENTIAL/PLATELET
Basophils Absolute: 0.2 10*3/uL — ABNORMAL HIGH (ref 0–0.1)
Basophils Relative: 1 %
Eosinophils Absolute: 0.5 10*3/uL (ref 0–0.7)
Eosinophils Relative: 4 %
HCT: 41.8 % (ref 40.0–52.0)
Hemoglobin: 13 g/dL (ref 13.0–18.0)
Lymphocytes Relative: 9 %
Lymphs Abs: 1.4 10*3/uL (ref 1.0–3.6)
MCH: 22 pg — ABNORMAL LOW (ref 26.0–34.0)
MCHC: 31 g/dL — ABNORMAL LOW (ref 32.0–36.0)
MCV: 71 fL — ABNORMAL LOW (ref 80.0–100.0)
Monocytes Absolute: 0.4 10*3/uL (ref 0.2–1.0)
Monocytes Relative: 3 %
Neutro Abs: 12.5 10*3/uL — ABNORMAL HIGH (ref 1.4–6.5)
Neutrophils Relative %: 83 %
Platelets: 326 10*3/uL (ref 150–440)
RBC: 5.89 MIL/uL (ref 4.40–5.90)
RDW: 17.1 % — ABNORMAL HIGH (ref 11.5–14.5)
WBC: 15 10*3/uL — ABNORMAL HIGH (ref 3.8–10.6)

## 2016-10-01 LAB — COMPREHENSIVE METABOLIC PANEL
ALT: 19 U/L (ref 17–63)
AST: 25 U/L (ref 15–41)
Albumin: 4.1 g/dL (ref 3.5–5.0)
Alkaline Phosphatase: 94 U/L (ref 38–126)
Anion gap: 9 (ref 5–15)
BUN: 13 mg/dL (ref 6–20)
CO2: 24 mmol/L (ref 22–32)
Calcium: 9 mg/dL (ref 8.9–10.3)
Chloride: 106 mmol/L (ref 101–111)
Creatinine, Ser: 1.03 mg/dL (ref 0.61–1.24)
GFR calc Af Amer: >60 mL/min (ref >60)
GFR calc non Af Amer: >60 mL/min (ref >60)
Glucose, Bld: 102 mg/dL — ABNORMAL HIGH (ref 65–99)
Potassium: 4 mmol/L (ref 3.5–5.1)
Sodium: 139 mmol/L (ref 135–145)
Total Bilirubin: 0.8 mg/dL (ref 0.3–1.2)
Total Protein: 6.9 g/dL (ref 6.5–8.1)

## 2016-10-01 NOTE — Progress Notes (Signed)
Pt had called early this am and I did speak with him and Dr. B agreed to get blood work on pt.  His b/p 146/96 and yest. He went to drugstore and b/p 165/98 and he continues to have HA on right wide. He woke up about 5 days ago with redness in his eye (right). It still has some redness but it is the same eye he had stroke in before.  Let let md Know. I spoke to Dr b after he reviewed the hgb and hct and it is normal and does not meet criteria for phlebotomy, he does however had high b/p today and when he took it yest and in conjunction with his cont. HA dr B advised for pt to go see PCP.  I let pt know and he will contact them and he agreed that his levels looks good which was suprising to him with his HA that he has been having

## 2016-10-01 NOTE — Telephone Encounter (Signed)
Pt called the md on call [facility] and told her that he previously had stroke in right eye and he has been having HA, about 4 days ago he wok up with right eye red and painful.  It looks better today and he is worried about his numbers because of the previous stroke, HA, and vision issue in right eye.  I told him that he can come into day for blood work per Dr. B and then he can wait for labs and we can see if there are cancellation for infusion we may can add him on.  He wants his b/p checked and I told him to ask for md and I will check it.

## 2016-10-17 ENCOUNTER — Inpatient Hospital Stay: Payer: Managed Care, Other (non HMO)

## 2016-10-17 ENCOUNTER — Inpatient Hospital Stay: Payer: Managed Care, Other (non HMO) | Admitting: Internal Medicine

## 2016-10-29 ENCOUNTER — Inpatient Hospital Stay: Payer: Managed Care, Other (non HMO) | Attending: Internal Medicine | Admitting: Internal Medicine

## 2016-10-29 VITALS — BP 148/95 | HR 86 | Temp 97.8°F | Resp 18 | Wt 193.8 lb

## 2016-10-29 DIAGNOSIS — D751 Secondary polycythemia: Secondary | ICD-10-CM

## 2016-10-29 DIAGNOSIS — K219 Gastro-esophageal reflux disease without esophagitis: Secondary | ICD-10-CM | POA: Diagnosis not present

## 2016-10-29 DIAGNOSIS — R51 Headache: Secondary | ICD-10-CM | POA: Insufficient documentation

## 2016-10-29 DIAGNOSIS — Z7982 Long term (current) use of aspirin: Secondary | ICD-10-CM | POA: Diagnosis not present

## 2016-10-29 DIAGNOSIS — M109 Gout, unspecified: Secondary | ICD-10-CM | POA: Diagnosis not present

## 2016-10-29 DIAGNOSIS — Z79899 Other long term (current) drug therapy: Secondary | ICD-10-CM | POA: Insufficient documentation

## 2016-10-29 DIAGNOSIS — D45 Polycythemia vera: Secondary | ICD-10-CM | POA: Insufficient documentation

## 2016-10-29 DIAGNOSIS — M1990 Unspecified osteoarthritis, unspecified site: Secondary | ICD-10-CM | POA: Diagnosis not present

## 2016-10-29 MED ORDER — HYDROXYUREA 500 MG PO CAPS: 500.0000 mg | ORAL_CAPSULE | Freq: Every day | ORAL | 5 refills | Status: DC

## 2016-10-29 NOTE — Progress Notes (Signed)
Patient is here for follow up needs refill on hydroxyurea

## 2016-10-29 NOTE — Progress Notes (Signed)
[place] OFFICE PROGRESS NOTE  Patient Care Team: Dion Body, MD as PCP - General (Family Medicine)   SUMMARY OF ONCOLOGIC HISTORY: Oncology History   # 2014- POLYCYTHEMIA VERA - JAK-2 Positive; on Hydrea 500mg /day; aspirin 81 mg a day; phlebotomy for Hct >43 [headaches]     Polycythemia vera ([place])   04/18/2016 Initial Diagnosis    Polycythemia vera ([place])       INTERVAL HISTORY: Pleasant 60 year old male patient with above history of a jak 2 positive polycythemia vera is here for follow-up. Patient had a last phlebotomy approximately a month ago for hematocrit greater than 43.  Patient states that recently he had episode of conjunctival erythema- thought to be from excessive aspirin use. Is currently on 1 aspirin a day.  Patient is having headaches in the last few days. Denies any fatigue or night sweats or unusual weight loss.   REVIEW OF SYSTEMS:  A complete 10 point review of system is done which is negative except mentioned above/history of present illness.   PAST MEDICAL HISTORY :  Past Medical History:  Diagnosis Date  . Arthritis   . GERD (gastroesophageal reflux disease)   . Gout   . Low serum testosterone level   . Polycythemia vera ([place])   . Polycythemia, secondary 06/08/2015  . Pulmonary nodules   . Thoracic spine fracture (HCC)    compression fracture following a fall    PAST SURGICAL HISTORY :   Past Surgical History:  Procedure Laterality Date  . COLONOSCOPY  2013    FAMILY HISTORY :   Family History  Problem Relation Age of Onset  . Throat cancer      uncle  . Migraines Neg Hx   . Multiple sclerosis Neg Hx   . Neurofibromatosis Neg Hx   . Parkinsonism Neg Hx   . Seizures Neg Hx   . Neuropathy Neg Hx     SOCIAL HISTORY:   Social History  Substance Use Topics  . Smoking status: Never Smoker  . Smokeless tobacco: Not on file  . Alcohol use No     Comment: occasional alcohol use    ALLERGIES:  is allergic to  penicillin g.  MEDICATIONS:  Current Outpatient Prescriptions  Medication Sig Dispense Refill  . aspirin EC 81 MG tablet Take by mouth.    . Cholecalciferol (VITAMIN D-1000 MAX ST) 1000 units tablet Take by mouth.    . Esomeprazole Magnesium (NEXIUM PO) Take 1 capsule by mouth daily.    . fluticasone (FLONASE) 50 MCG/ACT nasal spray     . hydroxyurea (HYDREA) 500 MG capsule Take 1 capsule (500 mg total) by mouth daily. 30 capsule 5  . Multiple Vitamins-Minerals (MULTIVITAMIN ADULT PO) Take 1 tablet by mouth 1 day or 1 dose.     No current facility-administered medications for this visit.     PHYSICAL EXAMINATION: ECOG PERFORMANCE STATUS: 0 - Asymptomatic  BP (!) 148/95 (BP Location: Left Arm, Patient Position: Sitting)   Pulse 86   Temp 97.8 F (36.6 C) (Tympanic)   Resp 18   Wt 193 lb 12.8 oz (87.9 kg)   BMI 28.62 kg/m   Filed Weights   10/29/16 1411  Weight: 193 lb 12.8 oz (87.9 kg)    GENERAL: Well-nourished well-developed; Alert, no distress and comfortable.  Alone.  EYES: no pallor or icterus OROPHARYNX: no thrush or ulceration; good dentition  NECK: supple, no masses felt LYMPH:  no palpable lymphadenopathy in the cervical, axillary or inguinal regions LUNGS:  clear to auscultation and  No wheeze or crackles HEART/CVS: regular rate & rhythm and no murmurs; No lower extremity edema ABDOMEN:abdomen soft, non-tender and normal bowel sounds Musculoskeletal:no cyanosis of digits and no clubbing  PSYCH: alert & oriented x 3 with fluent speech NEURO: no focal motor/sensory deficits SKIN:  no rashes or significant lesions  LABORATORY DATA:  I have reviewed the data as listed    Component Value Date/Time   NA 139 10/01/2016 1105   NA 140 03/02/2014 0835   K 4.0 10/01/2016 1105   K 3.8 03/02/2014 0835   CL 106 10/01/2016 1105   CL 109 (H) 03/02/2014 0835   CO2 24 10/01/2016 1105   CO2 26 03/02/2014 0835   GLUCOSE 102 (H) 10/01/2016 1105   GLUCOSE 108 (H)  03/02/2014 0835   BUN 13 10/01/2016 1105   BUN 9 03/02/2014 0835   CREATININE 1.03 10/01/2016 1105   CREATININE 0.99 03/02/2014 0835   CALCIUM 9.0 10/01/2016 1105   CALCIUM 8.6 03/02/2014 0835   PROT 6.9 10/01/2016 1105   PROT 7.0 03/02/2014 0835   ALBUMIN 4.1 10/01/2016 1105   ALBUMIN 3.7 03/02/2014 0835   AST 25 10/01/2016 1105   AST 20 03/02/2014 0835   ALT 19 10/01/2016 1105   ALT 24 03/02/2014 0835   ALKPHOS 94 10/01/2016 1105   ALKPHOS 96 03/02/2014 0835   BILITOT 0.8 10/01/2016 1105   BILITOT 0.6 03/02/2014 0835   GFRNONAA >60 10/01/2016 1105   GFRNONAA >60 03/02/2014 0835   GFRAA >60 10/01/2016 1105   GFRAA >60 03/02/2014 0835    No results found for: SPEP, UPEP  Lab Results  Component Value Date   WBC 15.0 (H) 10/01/2016   NEUTROABS 12.5 (H) 10/01/2016   HGB 13.0 10/01/2016   HCT 41.8 10/01/2016   MCV 71.0 (L) 10/01/2016   PLT 326 10/01/2016      Chemistry      Component Value Date/Time   NA 139 10/01/2016 1105   NA 140 03/02/2014 0835   K 4.0 10/01/2016 1105   K 3.8 03/02/2014 0835   CL 106 10/01/2016 1105   CL 109 (H) 03/02/2014 0835   CO2 24 10/01/2016 1105   CO2 26 03/02/2014 0835   BUN 13 10/01/2016 1105   BUN 9 03/02/2014 0835   CREATININE 1.03 10/01/2016 1105   CREATININE 0.99 03/02/2014 0835      Component Value Date/Time   CALCIUM 9.0 10/01/2016 1105   CALCIUM 8.6 03/02/2014 0835   ALKPHOS 94 10/01/2016 1105   ALKPHOS 96 03/02/2014 0835   AST 25 10/01/2016 1105   AST 20 03/02/2014 0835   ALT 19 10/01/2016 1105   ALT 24 03/02/2014 0835   BILITOT 0.8 10/01/2016 1105   BILITOT 0.6 03/02/2014 0835      ASSESSMENT & PLAN:  Polycythemia vera ([place]) # Polycythemia vera-jack 2 positive. Hydrea 500 mg/aspirin a day; Patient's last phlebotomy was approximately 2 months ago.  No concerns for any transformation. No evidence of any thrombo-embolic events.  Target hematocrit has been around 43 for the patient/mostly for symptom control-headache  management.   # H&H every 2 months [STARTING IN MID- DEC 2017]/ possible phlebotomy; follow up with MD in 6 months/possible phlebotomy. Prescription for Hydrea was given.    Cammie Sickle, MD 10/29/2016 3:52 PM

## 2016-10-29 NOTE — Assessment & Plan Note (Addendum)
#   Polycythemia vera-jack 2 positive. Hydrea 500 mg/aspirin a day; Patient's last phlebotomy was approximately 2 months ago.  No concerns for any transformation. No evidence of any thrombo-embolic events.  Target hematocrit has been around 43 for the patient/mostly for symptom control-headache management.   # H&H every 2 months [STARTING IN MID- DEC 2017]/ possible phlebotomy; follow up with MD in 6 months/possible phlebotomy. Prescription for Hydrea was given.

## 2016-11-01 ENCOUNTER — Other Ambulatory Visit: Payer: Self-pay | Admitting: Internal Medicine

## 2016-11-01 DIAGNOSIS — D751 Secondary polycythemia: Secondary | ICD-10-CM

## 2016-11-01 DIAGNOSIS — D45 Polycythemia vera: Secondary | ICD-10-CM

## 2016-11-20 ENCOUNTER — Telehealth: Payer: Self-pay | Admitting: *Deleted

## 2016-11-20 DIAGNOSIS — D45 Polycythemia vera: Secondary | ICD-10-CM

## 2016-11-20 NOTE — Telephone Encounter (Signed)
Per Dr. Rogue Bussing- schedule patient for lab/ possible phlebotomy (H&H).

## 2016-11-20 NOTE — Telephone Encounter (Signed)
Asking to come in and have labs checked, he has had a headache for 2 days now. Please advise

## 2016-11-20 NOTE — Telephone Encounter (Signed)
Patient advised to come in tomorrow at 2 PM for lab / phlebotomy, had to leave message on VM regarding.

## 2016-11-21 ENCOUNTER — Inpatient Hospital Stay: Payer: Managed Care, Other (non HMO) | Attending: Internal Medicine

## 2016-11-21 ENCOUNTER — Inpatient Hospital Stay: Payer: Managed Care, Other (non HMO)

## 2016-11-21 VITALS — BP 149/89 | HR 71 | Temp 97.7°F | Resp 18

## 2016-11-21 DIAGNOSIS — D45 Polycythemia vera: Secondary | ICD-10-CM | POA: Insufficient documentation

## 2016-11-21 DIAGNOSIS — M109 Gout, unspecified: Secondary | ICD-10-CM | POA: Insufficient documentation

## 2016-11-21 DIAGNOSIS — R51 Headache: Secondary | ICD-10-CM | POA: Insufficient documentation

## 2016-11-21 DIAGNOSIS — Z7982 Long term (current) use of aspirin: Secondary | ICD-10-CM | POA: Insufficient documentation

## 2016-11-21 DIAGNOSIS — M1990 Unspecified osteoarthritis, unspecified site: Secondary | ICD-10-CM | POA: Insufficient documentation

## 2016-11-21 DIAGNOSIS — K219 Gastro-esophageal reflux disease without esophagitis: Secondary | ICD-10-CM | POA: Insufficient documentation

## 2016-11-21 DIAGNOSIS — Z79899 Other long term (current) drug therapy: Secondary | ICD-10-CM | POA: Insufficient documentation

## 2016-11-21 DIAGNOSIS — D751 Secondary polycythemia: Secondary | ICD-10-CM

## 2016-11-21 LAB — HEMOGLOBIN: Hemoglobin: 13.7 g/dL (ref 13.0–18.0)

## 2016-11-21 LAB — HEMATOCRIT: HCT: 44.9 % (ref 40.0–52.0)

## 2016-11-26 ENCOUNTER — Telehealth: Payer: Self-pay | Admitting: *Deleted

## 2016-11-26 DIAGNOSIS — D45 Polycythemia vera: Secondary | ICD-10-CM

## 2016-11-26 NOTE — Telephone Encounter (Signed)
Requesting to come in this week again to have labs checked and possible phlebotomy, Reports that he is still having a slight headache and that his labs were high last week. Please advise

## 2016-11-26 NOTE — Telephone Encounter (Signed)
Per Dr Rogue Bussing, come in for H & H and possible phlebotomy this week. Message sent to scheduling

## 2016-11-28 ENCOUNTER — Inpatient Hospital Stay: Payer: Managed Care, Other (non HMO)

## 2016-11-28 DIAGNOSIS — D45 Polycythemia vera: Secondary | ICD-10-CM | POA: Diagnosis not present

## 2016-11-28 LAB — HEMATOCRIT: HEMATOCRIT: 39.6 % — AB (ref 40.0–52.0)

## 2016-11-28 LAB — HEMOGLOBIN: Hemoglobin: 12.3 g/dL — ABNORMAL LOW (ref 13.0–18.0)

## 2016-11-29 ENCOUNTER — Inpatient Hospital Stay: Payer: Managed Care, Other (non HMO)

## 2016-12-31 ENCOUNTER — Inpatient Hospital Stay: Payer: Managed Care, Other (non HMO)

## 2016-12-31 ENCOUNTER — Inpatient Hospital Stay: Payer: Managed Care, Other (non HMO) | Attending: Internal Medicine

## 2016-12-31 DIAGNOSIS — Z7982 Long term (current) use of aspirin: Secondary | ICD-10-CM | POA: Insufficient documentation

## 2016-12-31 DIAGNOSIS — K219 Gastro-esophageal reflux disease without esophagitis: Secondary | ICD-10-CM | POA: Diagnosis not present

## 2016-12-31 DIAGNOSIS — M109 Gout, unspecified: Secondary | ICD-10-CM | POA: Diagnosis not present

## 2016-12-31 DIAGNOSIS — M1990 Unspecified osteoarthritis, unspecified site: Secondary | ICD-10-CM | POA: Insufficient documentation

## 2016-12-31 DIAGNOSIS — D45 Polycythemia vera: Secondary | ICD-10-CM | POA: Insufficient documentation

## 2016-12-31 DIAGNOSIS — R51 Headache: Secondary | ICD-10-CM | POA: Insufficient documentation

## 2016-12-31 DIAGNOSIS — Z79899 Other long term (current) drug therapy: Secondary | ICD-10-CM | POA: Insufficient documentation

## 2016-12-31 DIAGNOSIS — D751 Secondary polycythemia: Secondary | ICD-10-CM

## 2016-12-31 LAB — HEMATOCRIT: HCT: 40.1 % (ref 40.0–52.0)

## 2016-12-31 LAB — HEMOGLOBIN: HEMOGLOBIN: 12.2 g/dL — AB (ref 13.0–18.0)

## 2017-01-17 ENCOUNTER — Telehealth: Payer: Self-pay | Admitting: *Deleted

## 2017-01-17 DIAGNOSIS — D751 Secondary polycythemia: Secondary | ICD-10-CM

## 2017-01-17 NOTE — Telephone Encounter (Signed)
C/o headache for past 1.5 weeks and is requesting to come in Monday for lab check as his next fu appt and lab is not until March. Please advise

## 2017-01-18 NOTE — Telephone Encounter (Signed)
Per VO Dr Rogue Bussing ok to come in Monday for lab and possible phlebotomy. Patient agrees to come at 54 2/5, but is concerned about his headache and wants to speak with MD. He agrees to have labs checked and see if his Hgb is elevated and if not will will ask to speak with MD nurse for further instructions

## 2017-01-18 NOTE — Telephone Encounter (Signed)
md approved H&H to be drawn on Monday. - also sch. Pt for possible phlebotomy on Monday as well.

## 2017-01-21 ENCOUNTER — Inpatient Hospital Stay: Payer: Managed Care, Other (non HMO)

## 2017-01-21 ENCOUNTER — Inpatient Hospital Stay: Payer: Managed Care, Other (non HMO) | Attending: Internal Medicine

## 2017-01-21 VITALS — BP 138/90 | HR 88 | Temp 97.9°F | Resp 16

## 2017-01-21 DIAGNOSIS — Z79899 Other long term (current) drug therapy: Secondary | ICD-10-CM | POA: Insufficient documentation

## 2017-01-21 DIAGNOSIS — D45 Polycythemia vera: Secondary | ICD-10-CM | POA: Diagnosis present

## 2017-01-21 DIAGNOSIS — D751 Secondary polycythemia: Secondary | ICD-10-CM

## 2017-01-21 DIAGNOSIS — K219 Gastro-esophageal reflux disease without esophagitis: Secondary | ICD-10-CM | POA: Diagnosis not present

## 2017-01-21 DIAGNOSIS — M109 Gout, unspecified: Secondary | ICD-10-CM | POA: Insufficient documentation

## 2017-01-21 DIAGNOSIS — Z7982 Long term (current) use of aspirin: Secondary | ICD-10-CM | POA: Insufficient documentation

## 2017-01-21 DIAGNOSIS — M1990 Unspecified osteoarthritis, unspecified site: Secondary | ICD-10-CM | POA: Insufficient documentation

## 2017-01-21 LAB — HEMOGLOBIN: HEMOGLOBIN: 12.5 g/dL — AB (ref 13.0–18.0)

## 2017-01-21 LAB — HEMATOCRIT: HEMATOCRIT: 41 % (ref 40.0–52.0)

## 2017-01-21 NOTE — Progress Notes (Signed)
Derek [name] hematocrit was 41.0 this morning but was complaining of a headache. Proceeded with phlebotomy as ordered in parameters, due to patient symptoms. BP stable.

## 2017-02-06 ENCOUNTER — Other Ambulatory Visit: Payer: Self-pay | Admitting: Family Medicine

## 2017-02-06 DIAGNOSIS — G44209 Tension-type headache, unspecified, not intractable: Secondary | ICD-10-CM

## 2017-02-14 ENCOUNTER — Ambulatory Visit
Admission: RE | Admit: 2017-02-14 | Discharge: 2017-02-14 | Disposition: A | Discharge status: Home / Self Care | Payer: Managed Care, Other (non HMO) | Source: Ambulatory Visit | Attending: Family Medicine | Admitting: Family Medicine

## 2017-02-14 DIAGNOSIS — G44209 Tension-type headache, unspecified, not intractable: Secondary | ICD-10-CM | POA: Insufficient documentation

## 2017-02-26 ENCOUNTER — Inpatient Hospital Stay: Payer: Managed Care, Other (non HMO)

## 2017-02-26 ENCOUNTER — Inpatient Hospital Stay: Payer: Managed Care, Other (non HMO) | Attending: Internal Medicine

## 2017-02-26 DIAGNOSIS — D45 Polycythemia vera: Secondary | ICD-10-CM | POA: Diagnosis not present

## 2017-02-26 DIAGNOSIS — M1990 Unspecified osteoarthritis, unspecified site: Secondary | ICD-10-CM | POA: Insufficient documentation

## 2017-02-26 DIAGNOSIS — M109 Gout, unspecified: Secondary | ICD-10-CM | POA: Insufficient documentation

## 2017-02-26 DIAGNOSIS — Z7982 Long term (current) use of aspirin: Secondary | ICD-10-CM | POA: Diagnosis not present

## 2017-02-26 DIAGNOSIS — Z79899 Other long term (current) drug therapy: Secondary | ICD-10-CM | POA: Insufficient documentation

## 2017-02-26 DIAGNOSIS — D751 Secondary polycythemia: Secondary | ICD-10-CM

## 2017-02-26 DIAGNOSIS — K219 Gastro-esophageal reflux disease without esophagitis: Secondary | ICD-10-CM | POA: Insufficient documentation

## 2017-02-26 LAB — HEMOGLOBIN: Hemoglobin: 12.2 g/dL — ABNORMAL LOW (ref 13.0–17.0)

## 2017-02-26 LAB — HEMATOCRIT: HCT: 40.3 % (ref 39.0–52.0)

## 2017-04-24 ENCOUNTER — Telehealth: Payer: Self-pay | Admitting: *Deleted

## 2017-04-24 ENCOUNTER — Inpatient Hospital Stay: Payer: Managed Care, Other (non HMO) | Attending: Internal Medicine

## 2017-04-24 DIAGNOSIS — Z79899 Other long term (current) drug therapy: Secondary | ICD-10-CM | POA: Diagnosis not present

## 2017-04-24 DIAGNOSIS — Z7982 Long term (current) use of aspirin: Secondary | ICD-10-CM | POA: Insufficient documentation

## 2017-04-24 DIAGNOSIS — K219 Gastro-esophageal reflux disease without esophagitis: Secondary | ICD-10-CM | POA: Diagnosis not present

## 2017-04-24 DIAGNOSIS — M109 Gout, unspecified: Secondary | ICD-10-CM | POA: Diagnosis not present

## 2017-04-24 DIAGNOSIS — R51 Headache: Secondary | ICD-10-CM | POA: Diagnosis not present

## 2017-04-24 DIAGNOSIS — D45 Polycythemia vera: Secondary | ICD-10-CM | POA: Diagnosis not present

## 2017-04-24 DIAGNOSIS — D751 Secondary polycythemia: Secondary | ICD-10-CM

## 2017-04-24 LAB — CBC WITH DIFFERENTIAL/PLATELET
BASOS PCT: 1 %
Basophils Absolute: 0.2 10*3/uL — ABNORMAL HIGH (ref 0–0.1)
Eosinophils Absolute: 0.5 10*3/uL (ref 0–0.7)
Eosinophils Relative: 4 %
HEMATOCRIT: 40.5 % (ref 40.0–52.0)
HEMOGLOBIN: 12.6 g/dL — AB (ref 13.0–18.0)
LYMPHS ABS: 1.1 10*3/uL (ref 1.0–3.6)
Lymphocytes Relative: 8 %
MCH: 22 pg — AB (ref 26.0–34.0)
MCHC: 31.1 g/dL — AB (ref 32.0–36.0)
MCV: 70.9 fL — ABNORMAL LOW (ref 80.0–100.0)
MONO ABS: 0.3 10*3/uL (ref 0.2–1.0)
MONOS PCT: 3 %
NEUTROS ABS: 11.5 10*3/uL — AB (ref 1.4–6.5)
Neutrophils Relative %: 84 %
Platelets: 318 10*3/uL (ref 150–440)
RBC: 5.71 MIL/uL (ref 4.40–5.90)
RDW: 16.8 % — AB (ref 11.5–14.5)
WBC: 13.6 10*3/uL — ABNORMAL HIGH (ref 3.8–10.6)

## 2017-04-24 LAB — COMPREHENSIVE METABOLIC PANEL
ALBUMIN: 4.1 g/dL (ref 3.5–5.0)
ALK PHOS: 98 U/L (ref 38–126)
ALT: 19 U/L (ref 17–63)
ANION GAP: 5 (ref 5–15)
AST: 29 U/L (ref 15–41)
BILIRUBIN TOTAL: 1 mg/dL (ref 0.3–1.2)
BUN: 10 mg/dL (ref 6–20)
CALCIUM: 9.1 mg/dL (ref 8.9–10.3)
CO2: 27 mmol/L (ref 22–32)
Chloride: 106 mmol/L (ref 101–111)
Creatinine, Ser: 0.91 mg/dL (ref 0.61–1.24)
GLUCOSE: 99 mg/dL (ref 65–99)
POTASSIUM: 4.2 mmol/L (ref 3.5–5.1)
Sodium: 138 mmol/L (ref 135–145)
Total Protein: 7.4 g/dL (ref 6.5–8.1)

## 2017-04-24 NOTE — Telephone Encounter (Signed)
-----   Message from [place] sent at 04/24/2017  9:03 AM EDT ----- Stated he is having some headaches and wants to do lab early to see what is going on.

## 2017-04-24 NOTE — Telephone Encounter (Signed)
Patient waited for lab results today. hct stable at 40.5. Per md pt does not need phlebotomy.  Pt instructed per md- to have pt keep apts as scheduled on Monday. Lab apts can be cnl for Monday - not needed. Explained to patient that the headaches are not r/t to elevated hct level per md. Other causes for headaches have been ruled out for imaging which was ordered by pcp. Pt states that he has been under a tremendous amount of stress. He states that he was "just worried that the headaches were being caused by elevated hct levels. Pt reassured that hct was not elevated. Explained that pt would not received a phlebotomy unless his hct is 43 or greater. He asked why it was medically necessary for him to see md on Monday. I explained to patient that he has not been evaluated by Dr. Rogue Bussing since Nov. 2018. Since md is prescribing the hydrea, md needs to evaluate pts tolerance of the medication.  Pt gave verbal understanding. He states that he needs a RF on his hydroxyurea. He will ask Dr. Rogue Bussing to prescribe this at his scheduled appointments.  He thanked me for taking the time to speak to him about his results.

## 2017-04-24 NOTE — Telephone Encounter (Signed)
Per, md- lab only can be scheduled today. Keep md apt/poss. phlebotomy on Monday 5/14 at this time.   Scheduling made aware and will contact pt with lab only apt.

## 2017-04-29 ENCOUNTER — Inpatient Hospital Stay: Payer: Managed Care, Other (non HMO)

## 2017-04-29 ENCOUNTER — Inpatient Hospital Stay (HOSPITAL_BASED_OUTPATIENT_CLINIC_OR_DEPARTMENT_OTHER): Payer: Managed Care, Other (non HMO) | Admitting: Internal Medicine

## 2017-04-29 DIAGNOSIS — D45 Polycythemia vera: Secondary | ICD-10-CM

## 2017-04-29 DIAGNOSIS — R51 Headache: Secondary | ICD-10-CM

## 2017-04-29 DIAGNOSIS — Z79899 Other long term (current) drug therapy: Secondary | ICD-10-CM

## 2017-04-29 DIAGNOSIS — D751 Secondary polycythemia: Secondary | ICD-10-CM

## 2017-04-29 MED ORDER — HYDROXYUREA 500 MG PO CAPS: ORAL_CAPSULE | ORAL | 5 refills | Status: DC

## 2017-04-29 NOTE — Assessment & Plan Note (Addendum)
#   Polycythemia vera-jack 2 positive. Hydrea 500 mg/aspirin a day; No concerns for any transformation. No evidence of any thrombo-embolic events. arget hematocrit has been around 43 for the patient/mostly for symptom control-headache management.  # Headaches- ? Allergies- improved.    # H&H every 2 months possible phlebotomy; follow up with MD in 6 months/possible phlebotomy. Prescription for Hydrea was renewed again today.

## 2017-04-29 NOTE — Progress Notes (Signed)
[place] OFFICE PROGRESS NOTE  [name] Care Team: Dion Body, MD as PCP - General (Family Medicine)   SUMMARY OF ONCOLOGIC HISTORY: Oncology History   # 2014- POLYCYTHEMIA VERA - JAK-2 Positive; on Hydrea 500mg /day; aspirin 81 mg a day; phlebotomy for Hct >43 [headaches]     Polycythemia vera ([place])   04/18/2016 Initial Diagnosis    Polycythemia vera ([place])       INTERVAL HISTORY: Derek 61 year old male [name] with above history of a jak 2 positive polycythemia vera is here for follow-up. [name] had a last phlebotomy approximately a month ago for hematocrit greater than 43.  [name] had episodes of intermittent headache over the last many weeks. He had an MRI of the brain that was negative. He also had a CBC done last week that showed hematocrit 40. He did not need phlebotomy.   Since last week his headache has improved. He states his headaches could have been from "stress at work". Also could be possibly from allergies. Denies any weight loss. Denies any nausea vomiting or early satiety.    REVIEW OF SYSTEMS:  A complete 10 point review of system is done which is negative except mentioned above/history of present illness.   PAST MEDICAL HISTORY :  Past Medical History:  Diagnosis Date  . Arthritis   . GERD (gastroesophageal reflux disease)   . Gout   . Low serum testosterone level   . Polycythemia vera ([place])   . Polycythemia, secondary 06/08/2015  . Pulmonary nodules   . Thoracic spine fracture (HCC)    compression fracture following a fall    PAST SURGICAL HISTORY :   Past Surgical History:  Procedure Laterality Date  . COLONOSCOPY  2013    FAMILY HISTORY :   Family History  Problem Relation Age of Onset  . Throat cancer Unknown        uncle  . Migraines Neg Hx   . Multiple sclerosis Neg Hx   . Neurofibromatosis Neg Hx   . Parkinsonism Neg Hx   . Seizures Neg Hx   . Neuropathy Neg Hx     SOCIAL HISTORY:   Social History   Substance Use Topics  . Smoking status: Never Smoker  . Smokeless tobacco: Not on file  . Alcohol use No     Comment: occasional alcohol use    ALLERGIES:  is allergic to penicillin g.  MEDICATIONS:  Current Outpatient Prescriptions  Medication Sig Dispense Refill  . aspirin EC 81 MG tablet Take by mouth.    . Cholecalciferol (VITAMIN D-1000 MAX ST) 1000 units tablet Take by mouth.    . Esomeprazole Magnesium (NEXIUM PO) Take 1 capsule by mouth daily.    . fluticasone (FLONASE) 50 MCG/ACT nasal spray     . Multiple Vitamins-Minerals (MULTIVITAMIN ADULT PO) Take 1 tablet by mouth 1 day or 1 dose.    . hydroxyurea (HYDREA) 500 MG capsule TAKE 1 CAPSULE (500 MG TOTAL) BY MOUTH DAILY. 04/20/16 30 capsule 5   No current facility-administered medications for this visit.     PHYSICAL EXAMINATION: ECOG PERFORMANCE STATUS: 0 - Asymptomatic  BP (!) 146/88 (BP Location: Left Arm, [name] Position: Sitting)   Pulse 95   Temp 97.6 F (36.4 C) (Tympanic)   Resp 20   Ht 5\' 9"  (1.753 m)   Wt 194 lb (88 kg)   BMI 28.65 kg/m   Filed Weights   04/29/17 1356  Weight: 194 lb (88 kg)    GENERAL: Well-nourished well-developed;  Alert, no distress and comfortable.  Alone.  EYES: no pallor or icterus OROPHARYNX: no thrush or ulceration; good dentition  NECK: supple, no masses felt LYMPH:  no palpable lymphadenopathy in the cervical, axillary or inguinal regions LUNGS: clear to auscultation and  No wheeze or crackles HEART/CVS: regular rate & rhythm and no murmurs; No lower extremity edema ABDOMEN:abdomen soft, non-tender and normal bowel sounds Musculoskeletal:no cyanosis of digits and no clubbing  PSYCH: alert & oriented x 3 with fluent speech NEURO: no focal motor/sensory deficits SKIN:  no rashes or significant lesions  LABORATORY DATA:  I have reviewed the data as listed    Component Value Date/Time   NA 138 04/24/2017 1255   NA 140 03/02/2014 0835   K 4.2 04/24/2017 1255   K  3.8 03/02/2014 0835   CL 106 04/24/2017 1255   CL 109 (H) 03/02/2014 0835   CO2 27 04/24/2017 1255   CO2 26 03/02/2014 0835   GLUCOSE 99 04/24/2017 1255   GLUCOSE 108 (H) 03/02/2014 0835   BUN 10 04/24/2017 1255   BUN 9 03/02/2014 0835   CREATININE 0.91 04/24/2017 1255   CREATININE 0.99 03/02/2014 0835   CALCIUM 9.1 04/24/2017 1255   CALCIUM 8.6 03/02/2014 0835   PROT 7.4 04/24/2017 1255   PROT 7.0 03/02/2014 0835   ALBUMIN 4.1 04/24/2017 1255   ALBUMIN 3.7 03/02/2014 0835   AST 29 04/24/2017 1255   AST 20 03/02/2014 0835   ALT 19 04/24/2017 1255   ALT 24 03/02/2014 0835   ALKPHOS 98 04/24/2017 1255   ALKPHOS 96 03/02/2014 0835   BILITOT 1.0 04/24/2017 1255   BILITOT 0.6 03/02/2014 0835   GFRNONAA >60 04/24/2017 1255   GFRNONAA >60 03/02/2014 0835   GFRAA >60 04/24/2017 1255   GFRAA >60 03/02/2014 0835    No results found for: SPEP, UPEP  Lab Results  Component Value Date   WBC 13.6 (H) 04/24/2017   NEUTROABS 11.5 (H) 04/24/2017   HGB 12.6 (L) 04/24/2017   HCT 40.5 04/24/2017   MCV 70.9 (L) 04/24/2017   PLT 318 04/24/2017      Chemistry      Component Value Date/Time   NA 138 04/24/2017 1255   NA 140 03/02/2014 0835   K 4.2 04/24/2017 1255   K 3.8 03/02/2014 0835   CL 106 04/24/2017 1255   CL 109 (H) 03/02/2014 0835   CO2 27 04/24/2017 1255   CO2 26 03/02/2014 0835   BUN 10 04/24/2017 1255   BUN 9 03/02/2014 0835   CREATININE 0.91 04/24/2017 1255   CREATININE 0.99 03/02/2014 0835      Component Value Date/Time   CALCIUM 9.1 04/24/2017 1255   CALCIUM 8.6 03/02/2014 0835   ALKPHOS 98 04/24/2017 1255   ALKPHOS 96 03/02/2014 0835   AST 29 04/24/2017 1255   AST 20 03/02/2014 0835   ALT 19 04/24/2017 1255   ALT 24 03/02/2014 0835   BILITOT 1.0 04/24/2017 1255   BILITOT 0.6 03/02/2014 0835      ASSESSMENT & PLAN:  Polycythemia vera ([place]) # Polycythemia vera-jack 2 positive. Hydrea 500 mg/aspirin a day; No concerns for any transformation. No evidence  of any thrombo-embolic events. arget hematocrit has been around 43 for the [name]/mostly for symptom control-headache management.  # Headaches- ? Allergies- improved.    # H&H every 2 months possible phlebotomy; follow up with MD in 6 months/possible phlebotomy. Prescription for Hydrea was renewed again today.     Cammie Sickle, MD 04/29/2017  4:42 PM

## 2017-05-02 ENCOUNTER — Other Ambulatory Visit: Payer: Self-pay | Admitting: Internal Medicine

## 2017-05-02 DIAGNOSIS — D751 Secondary polycythemia: Secondary | ICD-10-CM

## 2017-05-02 DIAGNOSIS — D45 Polycythemia vera: Secondary | ICD-10-CM

## 2017-07-01 ENCOUNTER — Inpatient Hospital Stay: Payer: Managed Care, Other (non HMO) | Attending: Internal Medicine

## 2017-07-01 ENCOUNTER — Inpatient Hospital Stay: Payer: Managed Care, Other (non HMO)

## 2017-07-01 DIAGNOSIS — Z7982 Long term (current) use of aspirin: Secondary | ICD-10-CM | POA: Insufficient documentation

## 2017-07-01 DIAGNOSIS — Z79899 Other long term (current) drug therapy: Secondary | ICD-10-CM | POA: Insufficient documentation

## 2017-07-01 DIAGNOSIS — M109 Gout, unspecified: Secondary | ICD-10-CM | POA: Insufficient documentation

## 2017-07-01 DIAGNOSIS — K219 Gastro-esophageal reflux disease without esophagitis: Secondary | ICD-10-CM | POA: Insufficient documentation

## 2017-07-01 DIAGNOSIS — D45 Polycythemia vera: Secondary | ICD-10-CM | POA: Insufficient documentation

## 2017-07-04 ENCOUNTER — Inpatient Hospital Stay: Payer: Managed Care, Other (non HMO)

## 2017-07-04 DIAGNOSIS — Z79899 Other long term (current) drug therapy: Secondary | ICD-10-CM | POA: Diagnosis not present

## 2017-07-04 DIAGNOSIS — D751 Secondary polycythemia: Secondary | ICD-10-CM

## 2017-07-04 DIAGNOSIS — D45 Polycythemia vera: Secondary | ICD-10-CM | POA: Diagnosis not present

## 2017-07-04 DIAGNOSIS — K219 Gastro-esophageal reflux disease without esophagitis: Secondary | ICD-10-CM | POA: Diagnosis not present

## 2017-07-04 DIAGNOSIS — Z7982 Long term (current) use of aspirin: Secondary | ICD-10-CM | POA: Diagnosis not present

## 2017-07-04 DIAGNOSIS — M109 Gout, unspecified: Secondary | ICD-10-CM | POA: Diagnosis not present

## 2017-07-04 LAB — HEMATOCRIT: HEMATOCRIT: 40.5 % (ref 40.0–52.0)

## 2017-07-04 LAB — HEMOGLOBIN: Hemoglobin: 12.3 g/dL — ABNORMAL LOW (ref 13.0–18.0)

## 2017-07-22 DIAGNOSIS — E781 Pure hyperglyceridemia: Secondary | ICD-10-CM | POA: Insufficient documentation

## 2017-07-22 DIAGNOSIS — Z7185 Encounter for immunization safety counseling: Secondary | ICD-10-CM | POA: Insufficient documentation

## 2017-07-23 ENCOUNTER — Other Ambulatory Visit: Payer: Self-pay | Admitting: Ophthalmology

## 2017-07-23 ENCOUNTER — Other Ambulatory Visit
Admission: RE | Admit: 2017-07-23 | Discharge: 2017-07-23 | Disposition: A | Discharge status: Home / Self Care | Payer: Managed Care, Other (non HMO) | Source: Ambulatory Visit | Attending: Ophthalmology | Admitting: Ophthalmology

## 2017-07-23 DIAGNOSIS — H47012 Ischemic optic neuropathy, left eye: Secondary | ICD-10-CM | POA: Insufficient documentation

## 2017-07-23 LAB — CBC WITH DIFFERENTIAL/PLATELET
BASOS ABS: 0.2 10*3/uL — AB (ref 0–0.1)
Basophils Relative: 1 %
EOS PCT: 4 %
Eosinophils Absolute: 0.5 10*3/uL (ref 0–0.7)
HCT: 42.1 % (ref 40.0–52.0)
HEMOGLOBIN: 12.8 g/dL — AB (ref 13.0–18.0)
LYMPHS ABS: 0.9 10*3/uL — AB (ref 1.0–3.6)
LYMPHS PCT: 7 %
MCH: 21.4 pg — AB (ref 26.0–34.0)
MCHC: 30.5 g/dL — ABNORMAL LOW (ref 32.0–36.0)
MCV: 70.3 fL — AB (ref 80.0–100.0)
MONO ABS: 0.3 10*3/uL (ref 0.2–1.0)
MONOS PCT: 2 %
Neutro Abs: 10.9 10*3/uL — ABNORMAL HIGH (ref 1.4–6.5)
Neutrophils Relative %: 86 %
PLATELETS: 334 10*3/uL (ref 150–440)
RBC: 5.99 MIL/uL — AB (ref 4.40–5.90)
RDW: 17.9 % — ABNORMAL HIGH (ref 11.5–14.5)
WBC: 12.7 10*3/uL — ABNORMAL HIGH (ref 3.8–10.6)

## 2017-07-23 LAB — C-REACTIVE PROTEIN: CRP: <0.8 mg/dL (ref <1.0)

## 2017-07-23 LAB — SEDIMENTATION RATE: Sed Rate: 1 mm/hr (ref 0–20)

## 2017-08-01 ENCOUNTER — Ambulatory Visit: Payer: Managed Care, Other (non HMO)

## 2017-09-02 ENCOUNTER — Inpatient Hospital Stay: Payer: Managed Care, Other (non HMO)

## 2017-09-02 ENCOUNTER — Inpatient Hospital Stay: Payer: Managed Care, Other (non HMO) | Attending: Internal Medicine

## 2017-09-02 DIAGNOSIS — D751 Secondary polycythemia: Secondary | ICD-10-CM

## 2017-09-02 DIAGNOSIS — D45 Polycythemia vera: Secondary | ICD-10-CM | POA: Insufficient documentation

## 2017-09-02 LAB — HEMOGLOBIN: Hemoglobin: 12.9 g/dL — ABNORMAL LOW (ref 13.0–18.0)

## 2017-09-02 LAB — HEMATOCRIT: HCT: 41.5 % (ref 40.0–52.0)

## 2017-09-02 NOTE — Progress Notes (Signed)
Informed patient Hematocrit below criteria for phlebotomy today, patient states he is feeling great, not symptomatic. Derek [name]

## 2017-10-28 ENCOUNTER — Inpatient Hospital Stay: Payer: Managed Care, Other (non HMO) | Attending: Internal Medicine | Admitting: Internal Medicine

## 2017-10-28 ENCOUNTER — Inpatient Hospital Stay: Payer: Managed Care, Other (non HMO)

## 2017-10-28 ENCOUNTER — Other Ambulatory Visit: Payer: Self-pay

## 2017-10-28 VITALS — BP 133/86 | HR 85 | Temp 97.7°F | Resp 20 | Ht 69.0 in | Wt 190.0 lb

## 2017-10-28 DIAGNOSIS — D45 Polycythemia vera: Secondary | ICD-10-CM | POA: Diagnosis present

## 2017-10-28 DIAGNOSIS — Z7982 Long term (current) use of aspirin: Secondary | ICD-10-CM | POA: Insufficient documentation

## 2017-10-28 DIAGNOSIS — D751 Secondary polycythemia: Secondary | ICD-10-CM

## 2017-10-28 DIAGNOSIS — K219 Gastro-esophageal reflux disease without esophagitis: Secondary | ICD-10-CM | POA: Diagnosis not present

## 2017-10-28 DIAGNOSIS — Z79899 Other long term (current) drug therapy: Secondary | ICD-10-CM | POA: Insufficient documentation

## 2017-10-28 DIAGNOSIS — Z880 Allergy status to penicillin: Secondary | ICD-10-CM | POA: Insufficient documentation

## 2017-10-28 DIAGNOSIS — H47012 Ischemic optic neuropathy, left eye: Secondary | ICD-10-CM | POA: Insufficient documentation

## 2017-10-28 DIAGNOSIS — M109 Gout, unspecified: Secondary | ICD-10-CM | POA: Insufficient documentation

## 2017-10-28 LAB — HEMOGLOBIN: HEMOGLOBIN: 13.2 g/dL (ref 13.0–18.0)

## 2017-10-28 LAB — HEMATOCRIT: HCT: 43.5 % (ref 40.0–52.0)

## 2017-10-28 MED ORDER — HYDROXYUREA 500 MG PO CAPS: 500.0000 mg | ORAL_CAPSULE | Freq: Every day | ORAL | 6 refills | Status: DC

## 2017-10-28 NOTE — Assessment & Plan Note (Addendum)
#   Polycythemia vera-jack 2 positive. Hydrea 500 mg/aspirin a day; No concerns for any transformation. Today, HCT is 43.5; recommend phlebotomy [see discussion re: "strokes". ]; on asprin 81 mg/day.   # "Stroke"- left eye- nonarteritic ischemic optic neuropathy.  Likely unrelated to polycythemia vera.  Reviewed the clinic notes from [facility].  # H&H every 2 months possible phlebotomy; follow up with MD in 6 months/possible phlebotomy. Prescription for Hydrea was renewed again today.   # 25 minutes face-to-face with the patient discussing the above plan of care; more than 50% of time spent on prognosis/ natural history; counseling and coordination.

## 2017-10-28 NOTE — Progress Notes (Signed)
[place] OFFICE PROGRESS NOTE  Patient Care Team: Dion Body, MD as PCP - General (Family Medicine)   SUMMARY OF ONCOLOGIC HISTORY: Oncology History   # 2014- POLYCYTHEMIA VERA - JAK-2 Positive; on Hydrea 500mg /day; aspirin 81 mg a day; phlebotomy for Hct >43 [headaches]  # nonarteritic ischemic optic neuropathy-Left eye [may 2018; Dr.Dingledein/Dr.Sitko at [place]; Previous Right eye         Polycythemia vera ([place])     INTERVAL HISTORY: Pleasant 61 year old male patient with above history of a jak 2 positive polycythemia vera is here for follow-up.   Patient was recently diagnosed with ischemic optic neuropathy of his left eye at [facility].  This is thought to be unrelated to his history of polycythemia vera.   Patient has poor vision on his left eye. Denies any weight loss. Denies any nausea vomiting or early satiety.  He denies any significant fatigue.    REVIEW OF SYSTEMS:  A complete 10 point review of system is done which is negative except mentioned above/history of present illness.   PAST MEDICAL HISTORY :  Past Medical History:  Diagnosis Date  . Arthritis   . GERD (gastroesophageal reflux disease)   . Gout   . Low serum testosterone level   . Polycythemia vera ([place])   . Polycythemia, secondary 06/08/2015  . Pulmonary nodules   . Thoracic spine fracture (HCC)    compression fracture following a fall    PAST SURGICAL HISTORY :   Past Surgical History:  Procedure Laterality Date  . COLONOSCOPY  2013    FAMILY HISTORY :   Family History  Problem Relation Age of Onset  . Throat cancer Unknown        uncle  . Migraines Neg Hx   . Multiple sclerosis Neg Hx   . Neurofibromatosis Neg Hx   . Parkinsonism Neg Hx   . Seizures Neg Hx   . Neuropathy Neg Hx     SOCIAL HISTORY:   Social History   Tobacco Use  . Smoking status: Never Smoker  Substance Use Topics  . Alcohol use: No    Alcohol/week: 0.0 oz    Comment: occasional alcohol  use  . Drug use: No    ALLERGIES:  is allergic to penicillin g.  MEDICATIONS:  Current Outpatient Medications  Medication Sig Dispense Refill  . aspirin EC 81 MG tablet Take 81 mg daily by mouth.     . Cholecalciferol (VITAMIN D-1000 MAX ST) 1000 units tablet Take 1,000 Units daily by mouth.     . Esomeprazole Magnesium (NEXIUM PO) Take 1 capsule by mouth daily.    . fluticasone (FLONASE) 50 MCG/ACT nasal spray     . hydroxyurea (HYDREA) 500 MG capsule Take 1 capsule (500 mg total) daily by mouth. May take with food to minimize GI side effects. 30 capsule 6  . Multiple Vitamins-Minerals (MULTIVITAMIN ADULT PO) Take 1 tablet by mouth 1 day or 1 dose.    Marland Kitchen amLODipine (NORVASC) 5 MG tablet Take 5 mg daily by mouth.  11   No current facility-administered medications for this visit.     PHYSICAL EXAMINATION: ECOG PERFORMANCE STATUS: 0 - Asymptomatic  BP 133/86   Pulse 85   Temp 97.7 F (36.5 C)   Resp 20   Ht 5\' 9"  (1.753 m)   Wt 190 lb (86.2 kg)   BMI 28.06 kg/m   Filed Weights   10/28/17 1353  Weight: 190 lb (86.2 kg)    GENERAL: Well-nourished  well-developed; Alert, no distress and comfortable.  Alone.  EYES: no pallor or icterus OROPHARYNX: no thrush or ulceration; good dentition  NECK: supple, no masses felt LYMPH:  no palpable lymphadenopathy in the cervical, axillary or inguinal regions LUNGS: clear to auscultation and  No wheeze or crackles HEART/CVS: regular rate & rhythm and no murmurs; No lower extremity edema ABDOMEN:abdomen soft, non-tender and normal bowel sounds Musculoskeletal:no cyanosis of digits and no clubbing  PSYCH: alert & oriented x 3 with fluent speech NEURO: no focal motor/sensory deficits SKIN:  no rashes or significant lesions  LABORATORY DATA:  I have reviewed the data as listed    Component Value Date/Time   NA 138 04/24/2017 1255   NA 140 03/02/2014 0835   K 4.2 04/24/2017 1255   K 3.8 03/02/2014 0835   CL 106 04/24/2017 1255   CL  109 (H) 03/02/2014 0835   CO2 27 04/24/2017 1255   CO2 26 03/02/2014 0835   GLUCOSE 99 04/24/2017 1255   GLUCOSE 108 (H) 03/02/2014 0835   BUN 10 04/24/2017 1255   BUN 9 03/02/2014 0835   CREATININE 0.91 04/24/2017 1255   CREATININE 0.99 03/02/2014 0835   CALCIUM 9.1 04/24/2017 1255   CALCIUM 8.6 03/02/2014 0835   PROT 7.4 04/24/2017 1255   PROT 7.0 03/02/2014 0835   ALBUMIN 4.1 04/24/2017 1255   ALBUMIN 3.7 03/02/2014 0835   AST 29 04/24/2017 1255   AST 20 03/02/2014 0835   ALT 19 04/24/2017 1255   ALT 24 03/02/2014 0835   ALKPHOS 98 04/24/2017 1255   ALKPHOS 96 03/02/2014 0835   BILITOT 1.0 04/24/2017 1255   BILITOT 0.6 03/02/2014 0835   GFRNONAA >60 04/24/2017 1255   GFRNONAA >60 03/02/2014 0835   GFRAA >60 04/24/2017 1255   GFRAA >60 03/02/2014 0835    No results found for: SPEP, UPEP  Lab Results  Component Value Date   WBC 12.7 (H) 07/23/2017   NEUTROABS 10.9 (H) 07/23/2017   HGB 13.2 10/28/2017   HCT 43.5 10/28/2017   MCV 70.3 (L) 07/23/2017   PLT 334 07/23/2017      Chemistry      Component Value Date/Time   NA 138 04/24/2017 1255   NA 140 03/02/2014 0835   K 4.2 04/24/2017 1255   K 3.8 03/02/2014 0835   CL 106 04/24/2017 1255   CL 109 (H) 03/02/2014 0835   CO2 27 04/24/2017 1255   CO2 26 03/02/2014 0835   BUN 10 04/24/2017 1255   BUN 9 03/02/2014 0835   CREATININE 0.91 04/24/2017 1255   CREATININE 0.99 03/02/2014 0835      Component Value Date/Time   CALCIUM 9.1 04/24/2017 1255   CALCIUM 8.6 03/02/2014 0835   ALKPHOS 98 04/24/2017 1255   ALKPHOS 96 03/02/2014 0835   AST 29 04/24/2017 1255   AST 20 03/02/2014 0835   ALT 19 04/24/2017 1255   ALT 24 03/02/2014 0835   BILITOT 1.0 04/24/2017 1255   BILITOT 0.6 03/02/2014 0835      ASSESSMENT & PLAN:  Polycythemia vera ([place]) # Polycythemia vera-jack 2 positive. Hydrea 500 mg/aspirin a day; No concerns for any transformation. Today, HCT is 43.5; recommend phlebotomy [see discussion re:  "strokes". ]; on asprin 81 mg/day.   # "Stroke"- left eye- nonarteritic ischemic optic neuropathy.  Likely unrelated to polycythemia vera.  Review[facility] clinic [facility].  # H&H every 2 months possible phlebotomy; follow up with MD in 6 months/possible phlebotomy. Prescription for Hydrea was renewed  again today.   # 25 minutes face-to-face with the patient discussing the above plan of care; more than 50% of time spent on prognosis/ natural history; counseling and coordination.     Cammie Sickle, MD 10/28/2017 4:27 PM

## 2017-12-30 ENCOUNTER — Inpatient Hospital Stay: Payer: Managed Care, Other (non HMO)

## 2017-12-30 ENCOUNTER — Inpatient Hospital Stay: Payer: Managed Care, Other (non HMO) | Attending: Internal Medicine

## 2017-12-30 VITALS — BP 146/75 | HR 89 | Temp 97.0°F | Resp 18

## 2017-12-30 DIAGNOSIS — D45 Polycythemia vera: Secondary | ICD-10-CM | POA: Diagnosis present

## 2017-12-30 DIAGNOSIS — Z880 Allergy status to penicillin: Secondary | ICD-10-CM | POA: Diagnosis not present

## 2017-12-30 DIAGNOSIS — M109 Gout, unspecified: Secondary | ICD-10-CM | POA: Insufficient documentation

## 2017-12-30 DIAGNOSIS — Z7982 Long term (current) use of aspirin: Secondary | ICD-10-CM | POA: Insufficient documentation

## 2017-12-30 DIAGNOSIS — K219 Gastro-esophageal reflux disease without esophagitis: Secondary | ICD-10-CM | POA: Diagnosis not present

## 2017-12-30 DIAGNOSIS — Z79899 Other long term (current) drug therapy: Secondary | ICD-10-CM | POA: Diagnosis not present

## 2017-12-30 DIAGNOSIS — H47012 Ischemic optic neuropathy, left eye: Secondary | ICD-10-CM | POA: Insufficient documentation

## 2017-12-30 DIAGNOSIS — D751 Secondary polycythemia: Secondary | ICD-10-CM

## 2017-12-30 LAB — HEMATOCRIT: HCT: 43.6 % (ref 40.0–52.0)

## 2017-12-30 LAB — HEMOGLOBIN: Hemoglobin: 13.3 g/dL (ref 13.0–18.0)

## 2018-02-24 ENCOUNTER — Inpatient Hospital Stay: Payer: Managed Care, Other (non HMO) | Attending: Internal Medicine | Admitting: *Deleted

## 2018-02-24 ENCOUNTER — Inpatient Hospital Stay: Payer: Managed Care, Other (non HMO)

## 2018-02-24 DIAGNOSIS — D45 Polycythemia vera: Secondary | ICD-10-CM | POA: Diagnosis not present

## 2018-02-24 LAB — HEMOGLOBIN: Hemoglobin: 13.4 g/dL (ref 13.0–18.0)

## 2018-02-24 LAB — HEMATOCRIT: HCT: 43.9 % (ref 40.0–52.0)

## 2018-04-28 ENCOUNTER — Inpatient Hospital Stay (HOSPITAL_BASED_OUTPATIENT_CLINIC_OR_DEPARTMENT_OTHER): Payer: Managed Care, Other (non HMO) | Admitting: Internal Medicine

## 2018-04-28 ENCOUNTER — Encounter: Payer: Self-pay | Admitting: Internal Medicine

## 2018-04-28 ENCOUNTER — Inpatient Hospital Stay: Payer: Managed Care, Other (non HMO) | Attending: Internal Medicine

## 2018-04-28 ENCOUNTER — Other Ambulatory Visit: Payer: Self-pay

## 2018-04-28 ENCOUNTER — Inpatient Hospital Stay: Payer: Managed Care, Other (non HMO)

## 2018-04-28 VITALS — BP 126/85 | HR 91 | Temp 97.9°F | Resp 20 | Ht 69.0 in | Wt 189.0 lb

## 2018-04-28 DIAGNOSIS — D45 Polycythemia vera: Secondary | ICD-10-CM

## 2018-04-28 DIAGNOSIS — R161 Splenomegaly, not elsewhere classified: Secondary | ICD-10-CM | POA: Diagnosis not present

## 2018-04-28 DIAGNOSIS — R740 Nonspecific elevation of levels of transaminase and lactic acid dehydrogenase [LDH]: Secondary | ICD-10-CM | POA: Insufficient documentation

## 2018-04-28 DIAGNOSIS — Z79899 Other long term (current) drug therapy: Secondary | ICD-10-CM | POA: Diagnosis not present

## 2018-04-28 LAB — COMPREHENSIVE METABOLIC PANEL
ALT: 18 U/L (ref 17–63)
ANION GAP: 9 (ref 5–15)
AST: 26 U/L (ref 15–41)
Albumin: 4 g/dL (ref 3.5–5.0)
Alkaline Phosphatase: 112 U/L (ref 38–126)
BUN: 10 mg/dL (ref 6–20)
CHLORIDE: 106 mmol/L (ref 101–111)
CO2: 24 mmol/L (ref 22–32)
Calcium: 8.7 mg/dL — ABNORMAL LOW (ref 8.9–10.3)
Creatinine, Ser: 0.99 mg/dL (ref 0.61–1.24)
Glucose, Bld: 114 mg/dL — ABNORMAL HIGH (ref 65–99)
POTASSIUM: 4.2 mmol/L (ref 3.5–5.1)
Sodium: 139 mmol/L (ref 135–145)
Total Bilirubin: 0.8 mg/dL (ref 0.3–1.2)
Total Protein: 6.7 g/dL (ref 6.5–8.1)

## 2018-04-28 LAB — CBC WITH DIFFERENTIAL/PLATELET
BASOS ABS: 0.2 10*3/uL — AB (ref 0–0.1)
BASOS PCT: 1 %
EOS ABS: 0.6 10*3/uL (ref 0–0.7)
Eosinophils Relative: 3 %
HEMATOCRIT: 41.4 % (ref 40.0–52.0)
Hemoglobin: 12.8 g/dL — ABNORMAL LOW (ref 13.0–18.0)
Lymphocytes Relative: 8 %
Lymphs Abs: 1.3 10*3/uL (ref 1.0–3.6)
MCH: 22.2 pg — ABNORMAL LOW (ref 26.0–34.0)
MCHC: 30.9 g/dL — ABNORMAL LOW (ref 32.0–36.0)
MCV: 71.9 fL — ABNORMAL LOW (ref 80.0–100.0)
MONO ABS: 0.4 10*3/uL (ref 0.2–1.0)
Monocytes Relative: 2 %
NEUTROS ABS: 14.7 10*3/uL — AB (ref 1.4–6.5)
NEUTROS PCT: 86 %
PLATELETS: 421 10*3/uL (ref 150–440)
RBC: 5.76 MIL/uL (ref 4.40–5.90)
RDW: 17.5 % — AB (ref 11.5–14.5)
WBC: 17.3 10*3/uL — AB (ref 3.8–10.6)

## 2018-04-28 LAB — LACTATE DEHYDROGENASE: LDH: 215 U/L — AB (ref 98–192)

## 2018-04-28 MED ORDER — HYDROXYUREA 500 MG PO CAPS: 500.0000 mg | ORAL_CAPSULE | Freq: Every day | ORAL | 6 refills | Status: DC

## 2018-04-28 NOTE — Assessment & Plan Note (Addendum)
#   Polycythemia vera-jak 2 positive. Hydrea 500 mg/aspirin a day.Today, HCT is 41; hold phlebotomy today;  on asprin 81 mg/day.   #Question worsening/transformation of polycythemia vera -elevated white count/17 unclear etiology predominant neutrophilia; splenomegaly on exam; check LDH also check ultrasound of the abdomen. Hydrea prescription given.    #Left foot pain-question stress fracture; no concerns for gout. Continue NSAIDs for now.  # "Stroke"- left eye- nonarteritic ischemic optic neuropathy.  Unrelated to polycythemia vera. STABLE.   # check LDH today; check US- spleen ASAP.   # 2 months/labs/ cbc/cmp/ldh;  [one blood]; will call with results- [phone].    # 25 minutes face-to-face with the patient discussing the above plan of care; more than 50% of time spent on prognosis/ natural history; counseling and coordination.

## 2018-04-28 NOTE — Progress Notes (Signed)
[facility] OFFICE PROGRESS NOTE  Patient Care Team: Dion Body, MD as PCP - General (Family Medicine)  Cancer Staging No matching staging information was found for the patient.   Oncology History   # 2014- POLYCYTHEMIA VERA - JAK-2 Positive; on Hydrea 500mg /day; aspirin 81 mg a day; phlebotomy for Hct >43 [headaches]  # nonarteritic ischemic optic neuropathy-Left eye [may 2018; Dr.Dingledein/Dr.Sitko at [facility]; Previous Right eye         Polycythemia vera ([place])      INTERVAL HISTORY:  Derek [name] 62 y.o.  male pleasant patient above history of Jak 2+ polycythemia vera on Hydrea is here for follow-up.  Patient noted to have pain in his left foot over 3 weeks.  Denies any trauma.  Improved on NSAIDs.  Worse with movement.  5 on a scale of 10.  Evaluated by PCP with x-rays no fractures.  Patient lost about 5 pounds in the last 1 year.  Denies any early satiety.  Denies any nausea vomiting.  Good energy levels.  Review of Systems  Constitutional: Negative for chills, diaphoresis, fever, malaise/fatigue and weight loss.  HENT: Negative for nosebleeds and sore throat.   Eyes: Negative for double vision.  Respiratory: Negative for cough, hemoptysis, sputum production, shortness of breath and wheezing.   Cardiovascular: Negative for chest pain, palpitations, orthopnea and leg swelling.  Gastrointestinal: Negative for abdominal pain, blood in stool, constipation, diarrhea, heartburn, melena, nausea and vomiting.  Genitourinary: Negative for dysuria, frequency and urgency.  Musculoskeletal: Negative for back pain and joint pain.  Skin: Negative.  Negative for itching and rash.  Neurological: Negative for dizziness, tingling, focal weakness, weakness and headaches.  Endo/Heme/Allergies: Does not bruise/bleed easily.  Psychiatric/Behavioral: Negative for depression. The patient is not nervous/anxious and does not have insomnia.       PAST MEDICAL HISTORY :   Past Medical History:  Diagnosis Date  . Arthritis   . GERD (gastroesophageal reflux disease)   . Gout   . Low serum testosterone level   . Polycythemia vera ([place])   . Polycythemia, secondary 06/08/2015  . Pulmonary nodules   . Thoracic spine fracture ([facility])    compression fracture following a fall    PAST SURGICAL HISTORY :   Past Surgical History:  Procedure Laterality Date  . COLONOSCOPY  2013    FAMILY HISTORY :   Family History  Problem Relation Age of Onset  . Throat cancer Unknown        uncle  . Migraines Neg Hx   . Multiple sclerosis Neg Hx   . Neurofibromatosis Neg Hx   . Parkinsonism Neg Hx   . Seizures Neg Hx   . Neuropathy Neg Hx     SOCIAL HISTORY:   Social History   Tobacco Use  . Smoking status: Never Smoker  Substance Use Topics  . Alcohol use: No    Alcohol/week: 0.0 oz    Comment: occasional alcohol use  . Drug use: No    ALLERGIES:  is allergic to penicillin g.  MEDICATIONS:  Current Outpatient Medications  Medication Sig Dispense Refill  . amLODipine (NORVASC) 5 MG tablet Take 5 mg daily by mouth.  11  . aspirin EC 81 MG tablet Take 81 mg daily by mouth.     . Cholecalciferol (VITAMIN D-1000 MAX ST) 1000 units tablet Take 1,000 Units daily by mouth.     . Esomeprazole Magnesium (NEXIUM PO) Take 1 capsule by mouth daily.    . fluticasone (FLONASE) 50  MCG/ACT nasal spray     . hydroxyurea (HYDREA) 500 MG capsule Take 1 capsule (500 mg total) by mouth daily. May take with food to minimize GI side effects. 30 capsule 6  . indomethacin (INDOCIN) 50 MG capsule Take 1 capsule by mouth as needed. GOUT  0  . Multiple Vitamins-Minerals (MULTIVITAMIN ADULT PO) Take 1 tablet by mouth 1 day or 1 dose.    . oxyCODONE-acetaminophen (PERCOCET/ROXICET) 5-325 MG tablet Take 1 tablet by mouth every 6 (six) hours as needed for pain. GOUT FLARE UP     No current facility-administered medications for this visit.     PHYSICAL EXAMINATION: ECOG PERFORMANCE  STATUS: 0 - Asymptomatic  BP 126/85 (Patient Position: Sitting)   Pulse 91   Temp 97.9 F (36.6 C) (Tympanic)   Resp 20   Ht 5\' 9"  (1.753 m)   Wt 189 lb (85.7 kg)   BMI 27.91 kg/m   Filed Weights   04/28/18 1409  Weight: 189 lb (85.7 kg)    GENERAL: Well-nourished well-developed; Alert, no distress and comfortable.  Alone. EYES: no pallor or icterus OROPHARYNX: no thrush or ulceration; NECK: supple; no lymph nodes felt. LYMPH:  no palpable lymphadenopathy in the axillary or inguinal regions LUNGS: Decreased breath sounds auscultation bilaterally. No wheeze or crackles HEART/CVS: regular rate & rhythm and no murmurs; No lower extremity edema ABDOMEN:abdomen soft, non-tender and normal bowel sounds. No hepatomegaly or splenomegaly.  Musculoskeletal:no cyanosis of digits and no clubbing  PSYCH: alert & oriented x 3 with fluent speech NEURO: no focal motor/sensory deficits SKIN:  no rashes or significant lesions    LABORATORY DATA:  I have reviewed the data as listed    Component Value Date/Time   NA 139 04/28/2018 1351   NA 140 03/02/2014 0835   K 4.2 04/28/2018 1351   K 3.8 03/02/2014 0835   CL 106 04/28/2018 1351   CL 109 (H) 03/02/2014 0835   CO2 24 04/28/2018 1351   CO2 26 03/02/2014 0835   GLUCOSE 114 (H) 04/28/2018 1351   GLUCOSE 108 (H) 03/02/2014 0835   BUN 10 04/28/2018 1351   BUN 9 03/02/2014 0835   CREATININE 0.99 04/28/2018 1351   CREATININE 0.99 03/02/2014 0835   CALCIUM 8.7 (L) 04/28/2018 1351   CALCIUM 8.6 03/02/2014 0835   PROT 6.7 04/28/2018 1351   PROT 7.0 03/02/2014 0835   ALBUMIN 4.0 04/28/2018 1351   ALBUMIN 3.7 03/02/2014 0835   AST 26 04/28/2018 1351   AST 20 03/02/2014 0835   ALT 18 04/28/2018 1351   ALT 24 03/02/2014 0835   ALKPHOS 112 04/28/2018 1351   ALKPHOS 96 03/02/2014 0835   BILITOT 0.8 04/28/2018 1351   BILITOT 0.6 03/02/2014 0835   GFRNONAA >60 04/28/2018 1351   GFRNONAA >60 03/02/2014 0835   GFRAA >60 04/28/2018 1351    GFRAA >60 03/02/2014 0835    No results found for: SPEP, UPEP  Lab Results  Component Value Date   WBC 17.3 (H) 04/28/2018   NEUTROABS 14.7 (H) 04/28/2018   HGB 12.8 (L) 04/28/2018   HCT 41.4 04/28/2018   MCV 71.9 (L) 04/28/2018   PLT 421 04/28/2018      Chemistry      Component Value Date/Time   NA 139 04/28/2018 1351   NA 140 03/02/2014 0835   K 4.2 04/28/2018 1351   K 3.8 03/02/2014 0835   CL 106 04/28/2018 1351   CL 109 (H) 03/02/2014 0835   CO2 24 04/28/2018 1351  CO2 26 03/02/2014 0835   BUN 10 04/28/2018 1351   BUN 9 03/02/2014 0835   CREATININE 0.99 04/28/2018 1351   CREATININE 0.99 03/02/2014 0835      Component Value Date/Time   CALCIUM 8.7 (L) 04/28/2018 1351   CALCIUM 8.6 03/02/2014 0835   ALKPHOS 112 04/28/2018 1351   ALKPHOS 96 03/02/2014 0835   AST 26 04/28/2018 1351   AST 20 03/02/2014 0835   ALT 18 04/28/2018 1351   ALT 24 03/02/2014 0835   BILITOT 0.8 04/28/2018 1351   BILITOT 0.6 03/02/2014 0835       RADIOGRAPHIC STUDIES: I have personally reviewed the radiological images as listed and agreed with the findings in the report. US Spleen (abdomen Limited)  Result Date: 04/29/2018 CLINICAL DATA:  Elevated white blood cell count, possible splenomegaly EXAM: ULTRASOUND ABDOMEN LIMITED COMPARISON:  None. FINDINGS: Sonographic evaluation of the spleen demonstrates it to be enlarged measuring 16.7 cm in greatest dimension. The calculated volume of the spleen is 1389 cc. IMPRESSION: Splenomegaly without focal mass lesion. Electronically Signed   By: Inez Catalina M.D.   On: 04/29/2018 13:02     ASSESSMENT & PLAN:  Polycythemia vera ([place]) # Polycythemia vera-jak 2 positive. Hydrea 500 mg/aspirin a day.Today, HCT is 41; hold phlebotomy today;  on asprin 81 mg/day.   #Question worsening/transformation of polycythemia vera -elevated white count/17 unclear etiology predominant neutrophilia; splenomegaly on exam; check LDH also check ultrasound of the  abdomen. Hydrea prescription given.    #Left foot pain-question stress fracture; no concerns for gout. Continue NSAIDs for now.  # "Stroke"- left eye- nonarteritic ischemic optic neuropathy.  Unrelated to polycythemia vera. STABLE.   # check LDH today; check US- spleen ASAP.   # 2 months/labs/ cbc/cmp/ldh;  [one blood]; will call with results- [phone].    # 25 minutes face-to-face with the patient discussing the above plan of care; more than 50% of time spent on prognosis/ natural history; counseling and coordination.   Orders Placed This Encounter  Procedures  . US SPLEEN (ABDOMEN LIMITED)    Standing Status:   Future    Number of Occurrences:   1    Standing Expiration Date:   06/29/2019    Order Specific Question:   Reason for Exam (SYMPTOM  OR DIAGNOSIS REQUIRED)    Answer:   splenomegaly    Order Specific Question:   Preferred imaging location?    Answer:   [facility]  . Lactate dehydrogenase    Standing Status:   Future    Number of Occurrences:   1    Standing Expiration Date:   04/29/2019   All questions were answered. The patient knows to call the clinic with any problems, questions or concerns.      Cammie Sickle, MD 04/29/2018 1:24 PM

## 2018-04-29 ENCOUNTER — Ambulatory Visit
Admission: RE | Admit: 2018-04-29 | Discharge: 2018-04-29 | Disposition: A | Discharge status: Home / Self Care | Payer: Managed Care, Other (non HMO) | Source: Ambulatory Visit | Attending: Internal Medicine | Admitting: Internal Medicine

## 2018-04-29 DIAGNOSIS — D45 Polycythemia vera: Secondary | ICD-10-CM | POA: Insufficient documentation

## 2018-04-29 DIAGNOSIS — R161 Splenomegaly, not elsewhere classified: Secondary | ICD-10-CM | POA: Diagnosis not present

## 2018-04-30 ENCOUNTER — Telehealth: Payer: Self-pay | Admitting: Internal Medicine

## 2018-04-30 DIAGNOSIS — R161 Splenomegaly, not elsewhere classified: Secondary | ICD-10-CM

## 2018-04-30 DIAGNOSIS — D45 Polycythemia vera: Secondary | ICD-10-CM

## 2018-04-30 NOTE — Telephone Encounter (Signed)
Bone marrow biopsy scheduled for 05/07/18. Pt [place] to [place] [facility] 8 [facility]. Please arrive 60 minutes prior to appointment time. Also, please do not eat or drink anything after midnight (6-8 hours NPO) except blood pressure/heart/seizure medications. Please take with just a sip of water  Brooke, please try to reach out to the patient. I have already left a vm but I ca not get in touch with him.  Also, f/u needs to be sch in 1 week s/p bone marrow boipsy

## 2018-04-30 NOTE — Telephone Encounter (Signed)
Left vm for patient to return my phone call. 

## 2018-04-30 NOTE — Telephone Encounter (Signed)
Left message for the patient to talk to results of the ultrasound showing splenomegaly.  I would recommend a bone marrow biopsy ASAP-orders in; follow-up with me 1 week later / no labs  H/B- please forward to schedulers re: Bone marrow once we speak to pt.

## 2018-04-30 NOTE — Telephone Encounter (Signed)
Patient returned my phone call at 1710- patient agreeable to bone marrow biopsy. patient need an apt set up for Thursday 05/15/18 at 915. I sent a msg to scheduling to arrange. This is the only time the patient could come due to his work schedule.

## 2018-05-06 ENCOUNTER — Other Ambulatory Visit: Payer: Self-pay | Admitting: Physician Assistant

## 2018-05-07 ENCOUNTER — Other Ambulatory Visit (HOSPITAL_COMMUNITY)
Admission: RE | Admit: 2018-05-07 | Discharge: 2018-05-07 | Disposition: A | Discharge status: Home / Self Care | Payer: Managed Care, Other (non HMO) | Source: Ambulatory Visit | Attending: Internal Medicine | Admitting: Internal Medicine

## 2018-05-07 ENCOUNTER — Ambulatory Visit
Admission: RE | Admit: 2018-05-07 | Discharge: 2018-05-07 | Disposition: A | Discharge status: Home / Self Care | Payer: Managed Care, Other (non HMO) | Source: Ambulatory Visit | Attending: Interventional Radiology | Admitting: Interventional Radiology

## 2018-05-07 DIAGNOSIS — Z808 Family history of malignant neoplasm of other organs or systems: Secondary | ICD-10-CM | POA: Insufficient documentation

## 2018-05-07 DIAGNOSIS — R918 Other nonspecific abnormal finding of lung field: Secondary | ICD-10-CM | POA: Insufficient documentation

## 2018-05-07 DIAGNOSIS — M1990 Unspecified osteoarthritis, unspecified site: Secondary | ICD-10-CM | POA: Insufficient documentation

## 2018-05-07 DIAGNOSIS — K219 Gastro-esophageal reflux disease without esophagitis: Secondary | ICD-10-CM | POA: Diagnosis not present

## 2018-05-07 DIAGNOSIS — Z880 Allergy status to penicillin: Secondary | ICD-10-CM | POA: Diagnosis not present

## 2018-05-07 DIAGNOSIS — M109 Gout, unspecified: Secondary | ICD-10-CM | POA: Insufficient documentation

## 2018-05-07 DIAGNOSIS — R161 Splenomegaly, not elsewhere classified: Secondary | ICD-10-CM

## 2018-05-07 DIAGNOSIS — Z7982 Long term (current) use of aspirin: Secondary | ICD-10-CM | POA: Diagnosis not present

## 2018-05-07 DIAGNOSIS — D751 Secondary polycythemia: Secondary | ICD-10-CM | POA: Insufficient documentation

## 2018-05-07 DIAGNOSIS — D45 Polycythemia vera: Secondary | ICD-10-CM

## 2018-05-07 DIAGNOSIS — Z79899 Other long term (current) drug therapy: Secondary | ICD-10-CM | POA: Diagnosis not present

## 2018-05-07 LAB — CBC
HEMATOCRIT: 42.9 % (ref 40.0–52.0)
Hemoglobin: 13.6 g/dL (ref 13.0–18.0)
MCH: 22.6 pg — ABNORMAL LOW (ref 26.0–34.0)
MCHC: 31.7 g/dL — AB (ref 32.0–36.0)
MCV: 71.2 fL — AB (ref 80.0–100.0)
PLATELETS: 341 10*3/uL (ref 150–440)
RBC: 6.02 MIL/uL — ABNORMAL HIGH (ref 4.40–5.90)
RDW: 17.2 % — AB (ref 11.5–14.5)
WBC: 15.9 10*3/uL — ABNORMAL HIGH (ref 3.8–10.6)

## 2018-05-07 LAB — APTT: APTT: 45 s — AB (ref 24–36)

## 2018-05-07 LAB — PROTIME-INR
INR: 1.18
Prothrombin Time: 14.9 seconds (ref 11.4–15.2)

## 2018-05-07 MED ORDER — FENTANYL CITRATE (PF) 100 MCG/2ML IJ SOLN
INTRAMUSCULAR | Status: DC | PRN
  Administered 2018-05-07 (×3): 50 ug via INTRAVENOUS

## 2018-05-07 MED ORDER — SODIUM CHLORIDE 0.9 % IV SOLN
INTRAVENOUS | Status: DC
  Administered 2018-05-07: 09:00:00 via INTRAVENOUS

## 2018-05-07 MED ORDER — FENTANYL CITRATE (PF) 100 MCG/2ML IJ SOLN
INTRAMUSCULAR | Status: AC
  Filled 2018-05-07: qty 4

## 2018-05-07 MED ORDER — MIDAZOLAM HCL 2 MG/2ML IJ SOLN
INTRAMUSCULAR | Status: DC | PRN
  Administered 2018-05-07 (×3): 1 mg via INTRAVENOUS

## 2018-05-07 MED ORDER — HEPARIN SOD (PORK) LOCK FLUSH 100 UNIT/ML IV SOLN
INTRAVENOUS | Status: AC
  Filled 2018-05-07: qty 5

## 2018-05-07 MED ORDER — MIDAZOLAM HCL 5 MG/5ML IJ SOLN
INTRAMUSCULAR | Status: AC
  Filled 2018-05-07: qty 5

## 2018-05-07 NOTE — Consult Note (Signed)
Chief Complaint: Polycythemia Vera  Referring Physician(s): Charlaine Dalton R  Patient Status: [facility] - Out-pt  History of Present Illness: Derek [name] is a 62 y.o. male with past medical history of polycythemia vera who presents today for CT guide bone marrow biopsy and aspiration.  The patient is accompanied by his family though serves as his own historian.  Patient is currently without complaint.  Specifically, no fever or chills.  No unintentional weight loss.  No chest pain or shortness of breath.    Past Medical History:  Diagnosis Date  . Arthritis   . GERD (gastroesophageal reflux disease)   . Gout   . Low serum testosterone level   . Polycythemia vera ([place])   . Polycythemia, secondary 06/08/2015  . Pulmonary nodules   . Thoracic spine fracture ([facility])    compression fracture following a fall    Past Surgical History:  Procedure Laterality Date  . COLONOSCOPY  2013    Allergies: Penicillin g  Medications: Prior to Admission medications   Medication Sig Start Date End Date Taking? Authorizing Provider  amLODipine (NORVASC) 5 MG tablet Take 5 mg daily by mouth. 10/12/17  Yes [provider]  aspirin EC 81 MG tablet Take 81 mg daily by mouth.    Yes [provider]  Cholecalciferol (VITAMIN D-1000 MAX ST) 1000 units tablet Take 1,000 Units daily by mouth.    Yes [provider]  Esomeprazole Magnesium (NEXIUM PO) Take 1 capsule by mouth daily.   Yes [provider]  fluticasone Asencion Islam) 50 MCG/ACT nasal spray  07/29/14  Yes [provider]  hydroxyurea (HYDREA) 500 MG capsule Take 1 capsule (500 mg total) by mouth daily. May take with food to minimize GI side effects. 04/28/18  Yes Cammie Sickle, MD  indomethacin (INDOCIN) 50 MG capsule Take 1 capsule by mouth as needed. GOUT 04/02/18  Yes [provider]  Multiple Vitamins-Minerals (MULTIVITAMIN ADULT PO) Take 1 tablet by mouth 1 day or 1 dose.   Yes  [provider]  oxyCODONE-acetaminophen (PERCOCET/ROXICET) 5-325 MG tablet Take 1 tablet by mouth every 6 (six) hours as needed for pain. GOUT FLARE UP 04/02/18  Yes [provider]     Family History  Problem Relation Age of Onset  . Throat cancer Unknown        uncle  . Migraines Neg Hx   . Multiple sclerosis Neg Hx   . Neurofibromatosis Neg Hx   . Parkinsonism Neg Hx   . Seizures Neg Hx   . Neuropathy Neg Hx     Social History   Socioeconomic History  . Marital status: Divorced    Spouse name: Not on file  . Number of children: 2  . Years of education: 15  . Highest education level: Not on file  Occupational History  . Occupation: [employer]  Social Needs  . Financial resource strain: Not on file  . Food insecurity:    Worry: Not on file    Inability: Not on file  . Transportation needs:    Medical: Not on file    Non-medical: Not on file  Tobacco Use  . Smoking status: Never Smoker  Substance and Sexual Activity  . Alcohol use: No    Alcohol/week: 0.0 oz    Comment: occasional alcohol use  . Drug use: No  . Sexual activity: Not on file  Lifestyle  . Physical activity:    Days per week: Not on file    Minutes per session:  Not on file  . Stress: Not on file  Relationships  . Social connections:    Talks on phone: Not on file    Gets together: Not on file    Attends religious service: Not on file    Active member of club or organization: Not on file    Attends meetings of clubs or organizations: Not on file    Relationship status: Not on file  Other Topics Concern  . Not on file  Social History Narrative   Lives alone   Caffeine use:  Very little per pt   1 glass tea.day    ECOG Status: 0 - Asymptomatic  Review of Systems: A 12 point ROS discussed and pertinent positives are indicated in the HPI above.  All other systems are negative.  Review of Systems  Constitutional: Negative.   Respiratory: Negative.   Cardiovascular:  Negative.     Vital Signs: BP 138/89   Pulse 86   Temp 98.4 F (36.9 C) (Oral)   Resp 20   Ht '5\' 9"'  (1.753 m)   Wt 189 lb (85.7 kg)   SpO2 96%   BMI 27.91 kg/m   Physical Exam  Constitutional: He appears well-developed and well-nourished.  HENT:  Head: Normocephalic and atraumatic.  Cardiovascular: Normal rate and regular rhythm.  Pulmonary/Chest: Effort normal and breath sounds normal.  Skin: Skin is warm and dry.  Psychiatric: He has a normal mood and affect. His behavior is normal. Thought content normal.  Nursing note and vitals reviewed.   Imaging: US Spleen (abdomen Limited)  Result Date: 04/29/2018 CLINICAL DATA:  Elevated white blood cell count, possible splenomegaly EXAM: ULTRASOUND ABDOMEN LIMITED COMPARISON:  None. FINDINGS: Sonographic evaluation of the spleen demonstrates it to be enlarged measuring 16.7 cm in greatest dimension. The calculated volume of the spleen is 1389 cc. IMPRESSION: Splenomegaly without focal mass lesion. Electronically Signed   By: Inez Catalina M.D.   On: 04/29/2018 13:02    Labs:  CBC: Recent Labs    07/23/17 1233  12/30/17 1424 02/24/18 1420 04/28/18 1351 05/07/18 0816  WBC 12.7*  --   --   --  17.3* 15.9*  HGB 12.8*   < > 13.3 13.4 12.8* 13.6  HCT 42.1   < > 43.6 43.9 41.4 42.9  PLT 334  --   --   --  421 341   < > = values in this interval not displayed.    COAGS: Recent Labs    05/07/18 0816  INR 1.18  APTT 45*    BMP: Recent Labs    04/28/18 1351  NA 139  K 4.2  CL 106  CO2 24  GLUCOSE 114*  BUN 10  CALCIUM 8.7*  CREATININE 0.99  GFRNONAA >60  GFRAA >60    LIVER FUNCTION TESTS: Recent Labs    04/28/18 1351  BILITOT 0.8  AST 26  ALT 18  ALKPHOS 112  PROT 6.7  ALBUMIN 4.0    TUMOR MARKERS: No results for input(s): AFPTM, CEA, CA199, CHROMGRNA in the last 8760 hours.  Assessment and Plan:  Derek [name] is a 62 y.o. male with past medical history of polycythemia vera who presents today for  CT guide bone marrow biopsy and aspiration.  The patient is accompanied by his family though serves as his own historian.  Patient is currently without complaint.  Specifically, no fever or chills.  No unintentional weight loss.  No chest pain or shortness of breath.  Risks and benefits of  CT guided BM Bx was discussed with the patient including, but not limited to bleeding, infection, damage to adjacent structures or low yield requiring additional tests.  All of the patient's questions were answered, patient is agreeable to proceed.  Consent signed and in chart.  Thank you for this interesting consult.  I greatly enjoyed meeting Deforest Maiden and look forward to participating in their care.  A copy of this report was sent to the requesting provider on this date.  Electronically Signed: Sandi Mariscal, MD 05/07/2018, 9:03 AM   I spent a total of 15 Minutes in face to face in clinical consultation, greater than 50% of which was counseling/coordinating care for CT guided BM Bx.

## 2018-05-07 NOTE — Discharge Instructions (Signed)
Moderate Conscious Sedation, Adult, Care After These instructions provide you with information about caring for yourself after your procedure. Your health care provider may also give you more specific instructions. Your treatment has been planned according to current medical practices, but problems sometimes occur. Call your health care provider if you have any problems or questions after your procedure. What can I expect after the procedure? After your procedure, it is common:  To feel sleepy for several hours.  To feel clumsy and have poor balance for several hours.  To have poor judgment for several hours.  To vomit if you eat too soon.  Follow these instructions at home: For at least 24 hours after the procedure:   Do not: ? Participate in activities where you could fall or become injured. ? Drive. ? Use heavy machinery. ? Drink alcohol. ? Take sleeping pills or medicines that cause drowsiness. ? Make important decisions or sign legal documents. ? Take care of children on your own.  Rest. Eating and drinking  Follow the diet recommended by your health care provider.  If you vomit: ? Drink water, juice, or soup when you can drink without vomiting. ? Make sure you have little or no nausea before eating solid foods. General instructions  Have a responsible adult stay with you until you are awake and alert.  Take over-the-counter and prescription medicines only as told by your health care provider.  If you smoke, do not smoke without supervision.  Keep all follow-up visits as told by your health care provider. This is important. Contact a health care provider if:  You keep feeling nauseous or you keep vomiting.  You feel light-headed.  You develop a rash.  You have a fever. Get help right away if:  You have trouble breathing. This information is not intended to replace advice given to you by your health care provider. Make sure you discuss any questions you have  with your health care provider. Document Released: 09/23/2013 Document Revised: 05/07/2016 Document Reviewed: 03/24/2016 Elsevier Interactive Patient Education  2018 [place]. Bone Marrow Aspiration and Bone Marrow Biopsy, Adult, Care After This sheet gives you information about how to care for yourself after your procedure. Your health care provider may also give you more specific instructions. If you have problems or questions, contact your health care provider. What can I expect after the procedure? After the procedure, it is common to have:  Mild pain and tenderness.  Swelling.  Bruising.  Follow these instructions at home:  Take over-the-counter or prescription medicines only as told by your health care provider.  Do not take baths, swim, or use a hot tub until your health care provider approves. Ask if you can take a shower or have a sponge bath.  Follow instructions from your health care provider about how to take care of the puncture site. Make sure you: ? Wash your hands with soap and water before you change your bandage (dressing). If soap and water are not available, use hand sanitizer. ? Change your dressing as told by your health care provider.  Check your puncture siteevery day for signs of infection. Check for: ? More redness, swelling, or pain. ? More fluid or blood. ? Warmth. ? Pus or a bad smell.  Return to your normal activities as told by your health care provider. Ask your health care provider what activities are safe for you.  Do not drive for 24 hours if you were given a medicine to help you relax (sedative).  Keep all follow-up visits as told by your health care provider. This is important. °Contact a health care provider if: °· You have more redness, swelling, or pain around the puncture site. °· You have more fluid or blood coming from the puncture site. °· Your puncture site feels warm to the touch. °· You have pus or a bad smell coming from the  puncture site. °· You have a fever. °· Your pain is not controlled with medicine. °This information is not intended to replace advice given to you by your health care provider. Make sure you discuss any questions you have with your health care provider. °Document Released: 06/22/2005 Document Revised: 06/22/2016 Document Reviewed: 05/16/2016 °Elsevier Interactive Patient Education © 2018 Elsevier Inc. ° °

## 2018-05-07 NOTE — Procedures (Signed)
Pre-procedure Diagnosis: Polycythemia Vera Post-procedure Diagnosis: Same  Technically successful CT guided bone marrow aspiration and biopsy of left iliac crest.   Complications: None Immediate  EBL: None  SignedSandi Mariscal Pager: [phone] 05/07/2018, 9:45 AM

## 2018-05-07 NOTE — Progress Notes (Signed)
Patient clinically stable post bone marrow biopsy per Dr Pascal Lux. Vitals have remained stable. Denies complaints. Family at bedside. bandade dressing intact. Discharge instructions given with questions answered.

## 2018-05-15 ENCOUNTER — Other Ambulatory Visit: Payer: Self-pay

## 2018-05-15 ENCOUNTER — Encounter: Payer: Self-pay | Admitting: Internal Medicine

## 2018-05-15 ENCOUNTER — Inpatient Hospital Stay (HOSPITAL_BASED_OUTPATIENT_CLINIC_OR_DEPARTMENT_OTHER): Payer: Managed Care, Other (non HMO) | Admitting: Internal Medicine

## 2018-05-15 VITALS — BP 157/84 | HR 90 | Temp 98.1°F | Resp 22 | Ht 69.0 in | Wt 189.0 lb

## 2018-05-15 DIAGNOSIS — Z79899 Other long term (current) drug therapy: Secondary | ICD-10-CM | POA: Diagnosis not present

## 2018-05-15 DIAGNOSIS — R740 Nonspecific elevation of levels of transaminase and lactic acid dehydrogenase [LDH]: Secondary | ICD-10-CM

## 2018-05-15 DIAGNOSIS — R161 Splenomegaly, not elsewhere classified: Secondary | ICD-10-CM | POA: Diagnosis not present

## 2018-05-15 DIAGNOSIS — D45 Polycythemia vera: Secondary | ICD-10-CM | POA: Diagnosis not present

## 2018-05-15 NOTE — Progress Notes (Signed)
[place] OFFICE PROGRESS NOTE  Patient Care Team: Dion Body, MD as PCP - General (Family Medicine)  Cancer Staging No matching staging information was found for the patient.   Oncology History   # 2014- POLYCYTHEMIA VERA - JAK-2 Positive; on Hydrea 524m/day; aspirin 81 mg a day; phlebotomy for Hct >43 [headaches];   # MAY 2019- BMBx- pan-hypercellular; no evidence of significant fibrosis; [place] 1389 cc/ splenomegaly  # nonarteritic ischemic optic neuropathy-Left eye [may 2018; Dr.Dingledein/Dr.Sitko at [facility] Previous Right eye ---------------------------------------    DIAGNOSIS: _0  POLYCYTHEMIA VERA  ;GOALS: control  CURRENT/MOST RECENT THERAPY _1  Hydrea          Polycythemia vera (HCC)      INTERVAL HISTORY:  [name]89y.o.  male pleasant patient above history of polycythemia vera is here to review the results of the bone marrow biopsy.  Patient denies any symptoms with no nausea no vomiting no loss of appetite or weight loss.  Review of Systems  Constitutional: Negative for chills, diaphoresis, fever, malaise/fatigue and weight loss.  HENT: Negative for nosebleeds and sore throat.   Eyes: Negative for double vision.  Respiratory: Negative for cough, hemoptysis, sputum production, shortness of breath and wheezing.   Cardiovascular: Negative for chest pain, palpitations, orthopnea and leg swelling.  Gastrointestinal: Negative for abdominal pain, blood in stool, constipation, diarrhea, heartburn, melena, nausea and vomiting.  Genitourinary: Negative for dysuria, frequency and urgency.  Musculoskeletal: Negative for back pain and joint pain.  Skin: Negative.  Negative for itching and rash.  Neurological: Negative for dizziness, tingling, focal weakness, weakness and headaches.  Endo/Heme/Allergies: Does not bruise/bleed easily.  Psychiatric/Behavioral: Negative for depression. The patient is not nervous/anxious and does not have  insomnia.       PAST MEDICAL HISTORY :  Past Medical History:  Diagnosis Date  . Arthritis   . GERD (gastroesophageal reflux disease)   . Gout   . Low serum testosterone level   . Polycythemia vera [place]   . Polycythemia, secondary 06/08/2015  . Pulmonary nodules   . Thoracic spine fracture (HCC)    compression fracture following a fall    PAST SURGICAL HISTORY :   Past Surgical History:  Procedure Laterality Date  . COLONOSCOPY  2013    FAMILY HISTORY :   Family History  Problem Relation Age of Onset  . Throat cancer Unknown        uncle  . Migraines Neg Hx   . Multiple sclerosis Neg Hx   . Neurofibromatosis Neg Hx   . Parkinsonism Neg Hx   . Seizures Neg Hx   . Neuropathy Neg Hx     SOCIAL HISTORY:   Social History   Tobacco Use  . Smoking status: Never Smoker  Substance Use Topics  . Alcohol use: No    Alcohol/week: 0.0 oz    Comment: occasional alcohol use  . Drug use: No    ALLERGIES:  is allergic to penicillin g.  MEDICATIONS:  Current Outpatient Medications  Medication Sig Dispense Refill  . amLODipine (NORVASC) 5 MG tablet Take 5 mg daily by mouth.  11  . aspirin EC 81 MG tablet Take 81 mg daily by mouth.     . Cholecalciferol (VITAMIN D-1000 MAX ST) 1000 units tablet Take 1,000 Units daily by mouth.     . fluticasone (FLONASE) 50 MCG/ACT nasal spray     . hydroxyurea (HYDREA) 500 MG capsule Take 1 capsule (500 mg total) by mouth daily.  May take with food to minimize GI side effects. 30 capsule 6  . Multiple Vitamins-Minerals (MULTIVITAMIN ADULT PO) Take 1 tablet by mouth 1 day or 1 dose.    . Esomeprazole Magnesium (NEXIUM PO) Take 1 capsule by mouth daily.    . indomethacin (INDOCIN) 50 MG capsule Take 1 capsule by mouth as needed. GOUT  0  . oxyCODONE-acetaminophen (PERCOCET/ROXICET) 5-325 MG tablet Take 1 tablet by mouth every 6 (six) hours as needed for pain. GOUT FLARE UP     No current facility-administered medications for this visit.      PHYSICAL EXAMINATION: ECOG PERFORMANCE STATUS: 0 - Asymptomatic  BP (!) 157/84   Pulse 90   Temp 98.1 F (36.7 C) (Tympanic)   Resp (!) 22   Ht _0  (1.753 m)   Wt 189 lb (85.7 kg)   BMI 27.91 kg/m   Filed Weights   05/15/18 0912  Weight: 189 lb (85.7 kg)    GENERAL: Well-nourished well-developed; Alert, no distress and comfortable. Accompanied by family.  EYES: no pallor or icterus OROPHARYNX: no thrush or ulceration; NECK: supple; no lymph nodes felt. LYMPH:  no palpable lymphadenopathy in the axillary or inguinal regions LUNGS: Decreased breath sounds auscultation bilaterally. No wheeze or crackles HEART/CVS: regular rate & rhythm and no murmurs; No lower extremity edema ABDOMEN:abdomen soft, non-tender and normal bowel sounds. No hepatomegaly. Positive for splenomegaly.  Musculoskeletal:no cyanosis of digits and no clubbing  PSYCH: alert & oriented x 3 with fluent speech NEURO: no focal motor/sensory deficits SKIN:  no rashes or significant lesions    LABORATORY DATA:  I have reviewed the data as listed    Component Value Date/Time   NA 139 04/28/2018 1351   NA 140 03/02/2014 0835   K 4.2 04/28/2018 1351   K 3.8 03/02/2014 0835   CL 106 04/28/2018 1351   CL 109 (H) 03/02/2014 0835   CO2 24 04/28/2018 1351   CO2 26 03/02/2014 0835   GLUCOSE 114 (H) 04/28/2018 1351   GLUCOSE 108 (H) 03/02/2014 0835   BUN 10 04/28/2018 1351   BUN 9 03/02/2014 0835   CREATININE 0.99 04/28/2018 1351   CREATININE 0.99 03/02/2014 0835   CALCIUM 8.7 (L) 04/28/2018 1351   CALCIUM 8.6 03/02/2014 0835   PROT 6.7 04/28/2018 1351   PROT 7.0 03/02/2014 0835   ALBUMIN 4.0 04/28/2018 1351   ALBUMIN 3.7 03/02/2014 0835   AST 26 04/28/2018 1351   AST 20 03/02/2014 0835   ALT 18 04/28/2018 1351   ALT 24 03/02/2014 0835   ALKPHOS 112 04/28/2018 1351   ALKPHOS 96 03/02/2014 0835   BILITOT 0.8 04/28/2018 1351   BILITOT 0.6 03/02/2014 0835   GFRNONAA >60 04/28/2018 1351    GFRNONAA >60 03/02/2014 0835   GFRAA >60 04/28/2018 1351   GFRAA >60 03/02/2014 0835    No results found for: SPEP, UPEP  Lab Results  Component Value Date   WBC 15.9 (H) 05/07/2018   NEUTROABS 14.7 (H) 04/28/2018   HGB 13.6 05/07/2018   HCT 42.9 05/07/2018   MCV 71.2 (L) 05/07/2018   PLT 341 05/07/2018      Chemistry      Component Value Date/Time   NA 139 04/28/2018 1351   NA 140 03/02/2014 0835   K 4.2 04/28/2018 1351   K 3.8 03/02/2014 0835   CL 106 04/28/2018 1351   CL 109 (H) 03/02/2014 0835   CO2 24 04/28/2018 1351   CO2 26 03/02/2014 0835   BUN  10 04/28/2018 1351   BUN 9 03/02/2014 0835   CREATININE 0.99 04/28/2018 1351   CREATININE 0.99 03/02/2014 0835      Component Value Date/Time   CALCIUM 8.7 (L) 04/28/2018 1351   CALCIUM 8.6 03/02/2014 0835   ALKPHOS 112 04/28/2018 1351   ALKPHOS 96 03/02/2014 0835   AST 26 04/28/2018 1351   AST 20 03/02/2014 0835   ALT 18 04/28/2018 1351   ALT 24 03/02/2014 0835   BILITOT 0.8 04/28/2018 1351   BILITOT 0.6 03/02/2014 0835       RADIOGRAPHIC STUDIES: I have personally reviewed the radiological images as listed and agreed with the findings in the report. No results found.   ASSESSMENT & PLAN:  Polycythemia vera (HCC) #Polycythemia vera Jak 2+; on Hydrea 500 mg once a day; aspirin once a day.  Stable  #Given the slightly elevated white count 17,000/and also elevated LDH-patient had an ultrasound of the spleen that showed splenomegaly.  Patient bone biopsy done for further work-up-negative for any myelofibrosis; showed pan hypercellular again suggestive of myeloproliferative neoplasm.  #I had a long discussion the patient and his family regarding potential concerns of transformation/acute leukemia as potential complications of long-standing polycythemia vera.  Patient currently does not have any of those concerning complication at this time.  Continue monitor closely.  #Left foot pain-question stress fracture; no  concerns for gout. Continue NSAIDs for now.  # "Stroke"- left eye- nonarteritic ischemic optic neuropathy.  Unrelated to polycythemia vera ; stable.  # H&H/possible phlebotomy in 2 months; cbc/ldh/bmp/phlebotomy/MD; 47m   # 25 minutes face-to-face with the patient discussing the above plan of care; more than 50% of time spent on prognosis/ natural history; counseling and coordination.   Orders Placed This Encounter  Procedures  . CBC with Differential    Standing Status:   Future    Standing Expiration Date:   05/16/2019  . Basic metabolic panel    Standing Status:   Future    Standing Expiration Date:   05/16/2019  . Lactate dehydrogenase    Standing Status:   Future    Standing Expiration Date:   05/16/2019  . CBC with Differential    Standing Status:   Future    Standing Expiration Date:   05/15/2019  . Basic metabolic panel    Standing Status:   Future    Standing Expiration Date:   05/15/2019  . Lactate dehydrogenase    Standing Status:   Future    Standing Expiration Date:   05/15/2019   All questions were answered. The patient knows to call the clinic with any problems, questions or concerns.      GCammie Sickle MD 05/15/2018 6:07 PM

## 2018-05-15 NOTE — Assessment & Plan Note (Addendum)
#  Polycythemia vera Jak 2+; on Hydrea 500 mg once a day; aspirin once a day.  Stable  #Given the slightly elevated white count 17,000/and also elevated LDH-patient had an ultrasound of the spleen that showed splenomegaly.  Patient bone biopsy done for further work-up-negative for any myelofibrosis; showed pan hypercellular again suggestive of myeloproliferative neoplasm.  #I had a long discussion the patient and his family regarding potential concerns of transformation/acute leukemia as potential complications of long-standing polycythemia vera.  Patient currently does not have any of those concerning complication at this time.  Continue monitor closely.  #Left foot pain-question stress fracture; no concerns for gout. Continue NSAIDs for now.  # "Stroke"- left eye- nonarteritic ischemic optic neuropathy.  Unrelated to polycythemia vera ; stable.  # H&H/possible phlebotomy in 2 months; cbc/ldh/bmp/phlebotomy/MD; 73m.   # 25 minutes face-to-face with the patient discussing the above plan of care; more than 50% of time spent on prognosis/ natural history; counseling and coordination.

## 2018-05-22 LAB — CHROMOSOME ANALYSIS, BONE MARROW

## 2018-07-14 ENCOUNTER — Inpatient Hospital Stay: Payer: Managed Care, Other (non HMO)

## 2018-07-14 ENCOUNTER — Inpatient Hospital Stay: Payer: Managed Care, Other (non HMO) | Attending: Internal Medicine

## 2018-07-14 ENCOUNTER — Ambulatory Visit: Payer: Managed Care, Other (non HMO) | Admitting: Internal Medicine

## 2018-07-14 DIAGNOSIS — D45 Polycythemia vera: Secondary | ICD-10-CM

## 2018-07-14 DIAGNOSIS — D751 Secondary polycythemia: Secondary | ICD-10-CM | POA: Insufficient documentation

## 2018-07-14 LAB — CBC WITH DIFFERENTIAL/PLATELET
BASOS ABS: 0.2 10*3/uL — AB (ref 0–0.1)
BASOS PCT: 1 %
Eosinophils Absolute: 0.6 10*3/uL (ref 0–0.7)
Eosinophils Relative: 4 %
HCT: 44.7 % (ref 40.0–52.0)
Hemoglobin: 13.7 g/dL (ref 13.0–18.0)
LYMPHS PCT: 7 %
Lymphs Abs: 1.2 10*3/uL (ref 1.0–3.6)
MCH: 22.1 pg — ABNORMAL LOW (ref 26.0–34.0)
MCHC: 30.6 g/dL — ABNORMAL LOW (ref 32.0–36.0)
MCV: 72 fL — AB (ref 80.0–100.0)
Monocytes Absolute: 0.4 10*3/uL (ref 0.2–1.0)
Monocytes Relative: 2 %
NEUTROS ABS: 14.8 10*3/uL — AB (ref 1.4–6.5)
Neutrophils Relative %: 86 %
PLATELETS: 529 10*3/uL — AB (ref 150–440)
RBC: 6.2 MIL/uL — AB (ref 4.40–5.90)
RDW: 18.4 % — ABNORMAL HIGH (ref 11.5–14.5)
WBC: 17.3 10*3/uL — AB (ref 3.8–10.6)

## 2018-07-14 LAB — BASIC METABOLIC PANEL
ANION GAP: 9 (ref 5–15)
BUN: 11 mg/dL (ref 8–23)
CALCIUM: 8.8 mg/dL — AB (ref 8.9–10.3)
CO2: 24 mmol/L (ref 22–32)
Chloride: 105 mmol/L (ref 98–111)
Creatinine, Ser: 1.07 mg/dL (ref 0.61–1.24)
GFR calc Af Amer: >60 mL/min (ref >60)
Glucose, Bld: 103 mg/dL — ABNORMAL HIGH (ref 70–99)
POTASSIUM: 4.5 mmol/L (ref 3.5–5.1)
SODIUM: 138 mmol/L (ref 135–145)

## 2018-07-14 LAB — LACTATE DEHYDROGENASE: LDH: 218 U/L — AB (ref 98–192)

## 2018-09-08 ENCOUNTER — Inpatient Hospital Stay (HOSPITAL_BASED_OUTPATIENT_CLINIC_OR_DEPARTMENT_OTHER): Payer: Managed Care, Other (non HMO) | Admitting: Internal Medicine

## 2018-09-08 ENCOUNTER — Encounter: Payer: Self-pay | Admitting: Internal Medicine

## 2018-09-08 ENCOUNTER — Inpatient Hospital Stay: Payer: Managed Care, Other (non HMO)

## 2018-09-08 ENCOUNTER — Inpatient Hospital Stay: Payer: Managed Care, Other (non HMO) | Attending: Internal Medicine

## 2018-09-08 VITALS — BP 128/87 | HR 84 | Temp 97.3°F | Resp 16 | Wt 189.0 lb

## 2018-09-08 DIAGNOSIS — Z7982 Long term (current) use of aspirin: Secondary | ICD-10-CM | POA: Diagnosis not present

## 2018-09-08 DIAGNOSIS — D72829 Elevated white blood cell count, unspecified: Secondary | ICD-10-CM | POA: Diagnosis not present

## 2018-09-08 DIAGNOSIS — H47012 Ischemic optic neuropathy, left eye: Secondary | ICD-10-CM

## 2018-09-08 DIAGNOSIS — D45 Polycythemia vera: Secondary | ICD-10-CM | POA: Insufficient documentation

## 2018-09-08 DIAGNOSIS — R161 Splenomegaly, not elsewhere classified: Secondary | ICD-10-CM

## 2018-09-08 DIAGNOSIS — M79672 Pain in left foot: Secondary | ICD-10-CM | POA: Diagnosis not present

## 2018-09-08 DIAGNOSIS — Z79899 Other long term (current) drug therapy: Secondary | ICD-10-CM | POA: Diagnosis not present

## 2018-09-08 LAB — CBC WITH DIFFERENTIAL/PLATELET
BASOS ABS: 0.2 10*3/uL — AB (ref 0–0.1)
BASOS PCT: 1 %
EOS ABS: 0.6 10*3/uL (ref 0–0.7)
EOS PCT: 4 %
HCT: 42.9 % (ref 40.0–52.0)
Hemoglobin: 13.4 g/dL (ref 13.0–18.0)
Lymphocytes Relative: 7 %
Lymphs Abs: 1.1 10*3/uL (ref 1.0–3.6)
MCH: 22.5 pg — AB (ref 26.0–34.0)
MCHC: 31.2 g/dL — ABNORMAL LOW (ref 32.0–36.0)
MCV: 72.1 fL — ABNORMAL LOW (ref 80.0–100.0)
Monocytes Absolute: 0.4 10*3/uL (ref 0.2–1.0)
Monocytes Relative: 2 %
Neutro Abs: 13.9 10*3/uL — ABNORMAL HIGH (ref 1.4–6.5)
Neutrophils Relative %: 86 %
PLATELETS: 379 10*3/uL (ref 150–440)
RBC: 5.94 MIL/uL — AB (ref 4.40–5.90)
RDW: 17.6 % — ABNORMAL HIGH (ref 11.5–14.5)
WBC: 16.2 10*3/uL — AB (ref 3.8–10.6)

## 2018-09-08 LAB — BASIC METABOLIC PANEL
Anion gap: 7 (ref 5–15)
BUN: 15 mg/dL (ref 8–23)
CALCIUM: 8.8 mg/dL — AB (ref 8.9–10.3)
CO2: 23 mmol/L (ref 22–32)
CREATININE: 0.8 mg/dL (ref 0.61–1.24)
Chloride: 110 mmol/L (ref 98–111)
GFR calc Af Amer: >60 mL/min (ref >60)
Glucose, Bld: 98 mg/dL (ref 70–99)
Potassium: 4.1 mmol/L (ref 3.5–5.1)
SODIUM: 140 mmol/L (ref 135–145)

## 2018-09-08 LAB — LACTATE DEHYDROGENASE: LDH: 221 U/L — AB (ref 98–192)

## 2018-09-08 NOTE — Progress Notes (Signed)
[facility] OFFICE PROGRESS NOTE  Patient Care Team: Dion Body, MD as PCP - General (Family Medicine)  Cancer Staging No matching staging information was found for the patient.   Oncology History   # 2014- POLYCYTHEMIA VERA - JAK-2 Positive; on Hydrea 500mg /day; aspirin 81 mg a day; phlebotomy for Hct >43 [headaches];   # MAY 2019- BMBx- pan-hypercellular; no evidence of significant fibrosis; MAY [place]- 1389 cc/ splenomegaly  # nonarteritic ischemic optic neuropathy-Left eye [may 2018; Dr.Dingledein/Dr.Sitko at [facility]; Previous Right eye ---------------------------------------    DIAGNOSIS: [ ]  POLYCYTHEMIA VERA  ;GOALS: control  CURRENT/MOST RECENT THERAPY [ ]  Hydrea          Polycythemia vera (HCC)      INTERVAL HISTORY:  Derek [name] 62 y.o.  male pleasant patient above history of polycythemia vera is here for follow-up.  Denies any weight loss.  Denies any fatigue or early satiety or nausea vomiting abdominal pain.    Review of Systems  Constitutional: Negative for chills, diaphoresis, fever, malaise/fatigue and weight loss.  HENT: Negative for nosebleeds and sore throat.   Eyes: Negative for double vision.  Respiratory: Negative for cough, hemoptysis, sputum production, shortness of breath and wheezing.   Cardiovascular: Negative for chest pain, palpitations, orthopnea and leg swelling.  Gastrointestinal: Negative for abdominal pain, blood in stool, constipation, diarrhea, heartburn, melena, nausea and vomiting.  Genitourinary: Negative for dysuria, frequency and urgency.  Musculoskeletal: Negative for back pain and joint pain.  Skin: Negative.  Negative for itching and rash.  Neurological: Negative for dizziness, tingling, focal weakness, weakness and headaches.  Endo/Heme/Allergies: Does not bruise/bleed easily.  Psychiatric/Behavioral: Negative for depression. The patient is not nervous/anxious and does not have insomnia.       PAST  MEDICAL HISTORY :  Past Medical History:  Diagnosis Date  . Arthritis   . GERD (gastroesophageal reflux disease)   . Gout   . Low serum testosterone level   . Polycythemia vera ([place])   . Polycythemia, secondary 06/08/2015  . Pulmonary nodules   . Thoracic spine fracture (HCC)    compression fracture following a fall    PAST SURGICAL HISTORY :   Past Surgical History:  Procedure Laterality Date  . COLONOSCOPY  2013    FAMILY HISTORY :   Family History  Problem Relation Age of Onset  . Throat cancer Unknown        uncle  . Migraines Neg Hx   . Multiple sclerosis Neg Hx   . Neurofibromatosis Neg Hx   . Parkinsonism Neg Hx   . Seizures Neg Hx   . Neuropathy Neg Hx     SOCIAL HISTORY:   Social History   Tobacco Use  . Smoking status: Never Smoker  Substance Use Topics  . Alcohol use: No    Alcohol/week: 0.0 standard drinks    Comment: occasional alcohol use  . Drug use: No    ALLERGIES:  is allergic to penicillin g.  MEDICATIONS:  Current Outpatient Medications  Medication Sig Dispense Refill  . amLODipine (NORVASC) 5 MG tablet Take 5 mg daily by mouth.  11  . aspirin EC 81 MG tablet Take 81 mg daily by mouth.     . Cholecalciferol (VITAMIN D-1000 MAX ST) 1000 units tablet Take 1,000 Units daily by mouth.     . Esomeprazole Magnesium (NEXIUM PO) Take 1 capsule by mouth daily.    . fluticasone (FLONASE) 50 MCG/ACT nasal spray     . hydroxyurea (HYDREA) 500 MG  capsule Take 1 capsule (500 mg total) by mouth daily. May take with food to minimize GI side effects. 30 capsule 6  . Multiple Vitamins-Minerals (MULTIVITAMIN ADULT PO) Take 1 tablet by mouth 1 day or 1 dose.    . indomethacin (INDOCIN) 50 MG capsule Take 1 capsule by mouth as needed. GOUT  0  . oxyCODONE-acetaminophen (PERCOCET/ROXICET) 5-325 MG tablet Take 1 tablet by mouth every 6 (six) hours as needed for pain. GOUT FLARE UP     No current facility-administered medications for this visit.     PHYSICAL  EXAMINATION: ECOG PERFORMANCE STATUS: 0 - Asymptomatic  BP 128/87 (BP Location: Left Arm, Patient Position: Sitting)   Pulse 84   Temp (!) 97.3 F (36.3 C) (Tympanic)   Resp 16   Wt 189 lb (85.7 kg)   BMI 27.91 kg/m   Filed Weights   09/08/18 0934  Weight: 189 lb (85.7 kg)    Physical Exam  Constitutional: He is oriented to person, place, and time and well-developed, well-nourished, and in no distress.  HENT:  Head: Normocephalic and atraumatic.  Mouth/Throat: Oropharynx is clear and moist. No oropharyngeal exudate.  Eyes: Pupils are equal, round, and reactive to light.  Neck: Normal range of motion. Neck supple.  Cardiovascular: Normal rate and regular rhythm.  Pulmonary/Chest: No respiratory distress. He has no wheezes.  Abdominal: Soft. Bowel sounds are normal. He exhibits no distension and no mass. There is no tenderness. There is no rebound and no guarding.  Positive for mild splenomegaly.  Musculoskeletal: Normal range of motion. He exhibits no edema or tenderness.  Neurological: He is alert and oriented to person, place, and time.  Skin: Skin is warm.  Psychiatric: Affect normal.       LABORATORY DATA:  I have reviewed the data as listed    Component Value Date/Time   NA 140 09/08/2018 0915   NA 140 03/02/2014 0835   K 4.1 09/08/2018 0915   K 3.8 03/02/2014 0835   CL 110 09/08/2018 0915   CL 109 (H) 03/02/2014 0835   CO2 23 09/08/2018 0915   CO2 26 03/02/2014 0835   GLUCOSE 98 09/08/2018 0915   GLUCOSE 108 (H) 03/02/2014 0835   BUN 15 09/08/2018 0915   BUN 9 03/02/2014 0835   CREATININE 0.80 09/08/2018 0915   CREATININE 0.99 03/02/2014 0835   CALCIUM 8.8 (L) 09/08/2018 0915   CALCIUM 8.6 03/02/2014 0835   PROT 6.7 04/28/2018 1351   PROT 7.0 03/02/2014 0835   ALBUMIN 4.0 04/28/2018 1351   ALBUMIN 3.7 03/02/2014 0835   AST 26 04/28/2018 1351   AST 20 03/02/2014 0835   ALT 18 04/28/2018 1351   ALT 24 03/02/2014 0835   ALKPHOS 112 04/28/2018 1351    ALKPHOS 96 03/02/2014 0835   BILITOT 0.8 04/28/2018 1351   BILITOT 0.6 03/02/2014 0835   GFRNONAA >60 09/08/2018 0915   GFRNONAA >60 03/02/2014 0835   GFRAA >60 09/08/2018 0915   GFRAA >60 03/02/2014 0835    No results found for: SPEP, UPEP  Lab Results  Component Value Date   WBC 16.2 (H) 09/08/2018   NEUTROABS 13.9 (H) 09/08/2018   HGB 13.4 09/08/2018   HCT 42.9 09/08/2018   MCV 72.1 (L) 09/08/2018   PLT 379 09/08/2018      Chemistry      Component Value Date/Time   NA 140 09/08/2018 0915   NA 140 03/02/2014 0835   K 4.1 09/08/2018 0915   K 3.8 03/02/2014 0835  CL 110 09/08/2018 0915   CL 109 (H) 03/02/2014 0835   CO2 23 09/08/2018 0915   CO2 26 03/02/2014 0835   BUN 15 09/08/2018 0915   BUN 9 03/02/2014 0835   CREATININE 0.80 09/08/2018 0915   CREATININE 0.99 03/02/2014 0835      Component Value Date/Time   CALCIUM 8.8 (L) 09/08/2018 0915   CALCIUM 8.6 03/02/2014 0835   ALKPHOS 112 04/28/2018 1351   ALKPHOS 96 03/02/2014 0835   AST 26 04/28/2018 1351   AST 20 03/02/2014 0835   ALT 18 04/28/2018 1351   ALT 24 03/02/2014 0835   BILITOT 0.8 04/28/2018 1351   BILITOT 0.6 03/02/2014 0835       RADIOGRAPHIC STUDIES: I have personally reviewed the radiological images as listed and agreed with the findings in the report. No results found.   ASSESSMENT & PLAN:  Polycythemia vera (HCC) #Polycythemia vera Jak 2+; on Hydrea 500 mg once a day; aspirin once a day.  Clinically stable.  #Patient continues to have slightly elevated white count 16,000; HCT 42.9- proceed with phlebotomy today.   #Left foot pain-question stress fracture; on  NSAIDs for now.STABLE.  # "Stroke"- left eye- nonarteritic ischemic optic neuropathy.  Unrelated to polycythemia vera ; STABLE.   # cbc/ldh/bmp/phlebotomy/MD; 3 months; possible phlebotomy. US spleen prior.    Orders Placed This Encounter  Procedures  . US SPLEEN (ABDOMEN LIMITED)    Standing Status:   Future    Standing  Expiration Date:   11/09/2019    Order Specific Question:   Reason for Exam (SYMPTOM  OR DIAGNOSIS REQUIRED)    Answer:   splenomegaly    Order Specific Question:   Preferred imaging location?    Answer:   [facility]  . CBC with Differential/Platelet    Standing Status:   Future    Standing Expiration Date:   09/09/2019  . Comprehensive metabolic panel    Standing Status:   Future    Standing Expiration Date:   09/09/2019  . Lactate dehydrogenase    Standing Status:   Future    Standing Expiration Date:   09/09/2019   All questions were answered. The patient knows to call the clinic with any problems, questions or concerns.      Cammie Sickle, MD 09/08/2018 9:40 PM

## 2018-09-08 NOTE — Assessment & Plan Note (Addendum)
#  Polycythemia vera Jak 2+; on Hydrea 500 mg once a day; aspirin once a day.  Clinically stable.  #Patient continues to have slightly elevated white count 16,000; HCT 42.9- proceed with phlebotomy today.   #Left foot pain-question stress fracture; on  NSAIDs for now.STABLE.  # "Stroke"- left eye- nonarteritic ischemic optic neuropathy.  Unrelated to polycythemia vera ; STABLE.   # cbc/ldh/bmp/phlebotomy/MD; 3 months; possible phlebotomy. US spleen prior.

## 2018-09-22 ENCOUNTER — Telehealth: Payer: Self-pay | Admitting: *Deleted

## 2018-09-22 NOTE — Telephone Encounter (Signed)
Patient called reporting that he is having a bit of pain in his LUQ and states he is scheduled for [place] of spleen in Dec and is wondering if that needs to be moved up or not

## 2018-09-23 NOTE — Telephone Encounter (Signed)
Appointment changed by Kristen Loader who was to notify patient

## 2018-09-23 NOTE — Telephone Encounter (Signed)
Ok to move the [place] sooner; keep follow up as planned.

## 2018-09-26 ENCOUNTER — Ambulatory Visit
Admission: RE | Admit: 2018-09-26 | Discharge: 2018-09-26 | Disposition: A | Discharge status: Home / Self Care | Payer: Managed Care, Other (non HMO) | Source: Ambulatory Visit | Attending: Internal Medicine | Admitting: Internal Medicine

## 2018-09-26 DIAGNOSIS — D45 Polycythemia vera: Secondary | ICD-10-CM | POA: Diagnosis not present

## 2018-09-26 DIAGNOSIS — R161 Splenomegaly, not elsewhere classified: Secondary | ICD-10-CM | POA: Diagnosis present

## 2018-09-29 ENCOUNTER — Telehealth: Payer: Self-pay | Admitting: *Deleted

## 2018-09-29 NOTE — Telephone Encounter (Signed)
Calling for results.  CLINICAL DATA:  Splenomegaly.  EXAM: ULTRASOUND ABDOMEN LIMITED  COMPARISON:  None.  FINDINGS: No focal splenic lesion is measures identified. The spleen measures 17.0 x 16.6 x 10.4 cm for a volume 1529.9 cc. Upper normal is 411.79 cc.  IMPRESSION: Marked splenomegaly without focal lesion.   Electronically Signed   By: Inge Rise M.D.   On: 09/26/2018 13:59  Left message on his voice mail of results slightly larger spleen and that someone will call him tomorrow with specifics

## 2018-09-30 ENCOUNTER — Telehealth: Payer: Self-pay | Admitting: Internal Medicine

## 2018-09-30 DIAGNOSIS — D45 Polycythemia vera: Secondary | ICD-10-CM

## 2018-09-30 MED ORDER — HYDROXYUREA 500 MG PO CAPS
ORAL_CAPSULE | ORAL | 3 refills | Status: DC
Start: 2018-09-30 — End: 2019-02-09

## 2018-09-30 NOTE — Telephone Encounter (Signed)
I spoke with patient who confirms he reiceveed Dr B message and confirmed dose change to 500 mg in AM and 1000 mg in PM

## 2018-09-30 NOTE — Telephone Encounter (Signed)
Spoke to patient regarding my recommendation-Hydrea 500 mg in the morning; thousand milligrams at night.  New prescription sent.  Please have the patient follow-up with me in approximately 1 month/CBC CMP LDH.  Please inform patient of appt.  GB

## 2018-09-30 NOTE — Addendum Note (Signed)
Addended by: Sabino Gasser on: 09/30/2018 02:33 PM   Modules accepted: Orders

## 2018-09-30 NOTE — Telephone Encounter (Signed)
[name]- I tried to reach the patient regarding ultrasound results; left voicemail to call back.  I reviewed the ultrasound/show splenomegaly increasing-recommend increasing the dose of Hydrea to 500mg  in am; and 1000mg  in pm.   I will be happy to see the patient next week to discuss my recommendations/review ultrasound in detail.  Please schedule the patient/if patient interested when he calls back. No labs.   Thanks, GB

## 2018-10-27 ENCOUNTER — Inpatient Hospital Stay (HOSPITAL_BASED_OUTPATIENT_CLINIC_OR_DEPARTMENT_OTHER): Payer: Managed Care, Other (non HMO) | Admitting: Internal Medicine

## 2018-10-27 ENCOUNTER — Inpatient Hospital Stay: Payer: Managed Care, Other (non HMO) | Attending: Internal Medicine

## 2018-10-27 VITALS — BP 134/84 | HR 74 | Temp 98.2°F | Resp 16 | Wt 192.0 lb

## 2018-10-27 DIAGNOSIS — M79672 Pain in left foot: Secondary | ICD-10-CM | POA: Diagnosis not present

## 2018-10-27 DIAGNOSIS — Z79899 Other long term (current) drug therapy: Secondary | ICD-10-CM | POA: Diagnosis not present

## 2018-10-27 DIAGNOSIS — D45 Polycythemia vera: Secondary | ICD-10-CM | POA: Insufficient documentation

## 2018-10-27 DIAGNOSIS — Z7982 Long term (current) use of aspirin: Secondary | ICD-10-CM | POA: Insufficient documentation

## 2018-10-27 DIAGNOSIS — L309 Dermatitis, unspecified: Secondary | ICD-10-CM | POA: Insufficient documentation

## 2018-10-27 LAB — CBC WITH DIFFERENTIAL/PLATELET
Abs Immature Granulocytes: 0.09 10*3/uL — ABNORMAL HIGH (ref 0.00–0.07)
BASOS ABS: 0.1 10*3/uL (ref 0.0–0.1)
Basophils Relative: 1 %
EOS PCT: 1 %
Eosinophils Absolute: 0.1 10*3/uL (ref 0.0–0.5)
HCT: 43.4 % (ref 39.0–52.0)
Hemoglobin: 12.6 g/dL — ABNORMAL LOW (ref 13.0–17.0)
IMMATURE GRANULOCYTES: 1 %
LYMPHS ABS: 0.9 10*3/uL (ref 0.7–4.0)
LYMPHS PCT: 10 %
MCH: 23.4 pg — AB (ref 26.0–34.0)
MCHC: 29 g/dL — ABNORMAL LOW (ref 30.0–36.0)
MCV: 80.7 fL (ref 80.0–100.0)
Monocytes Absolute: 0.2 10*3/uL (ref 0.1–1.0)
Monocytes Relative: 3 %
NEUTROS ABS: 7.9 10*3/uL — AB (ref 1.7–7.7)
NRBC: 0 % (ref 0.0–0.2)
Neutrophils Relative %: 84 %
PLATELETS: 460 10*3/uL — AB (ref 150–400)
RBC: 5.38 MIL/uL (ref 4.22–5.81)
RDW: 24.1 % — AB (ref 11.5–15.5)
SMEAR REVIEW: ADEQUATE
WBC: 9.4 10*3/uL (ref 4.0–10.5)

## 2018-10-27 LAB — COMPREHENSIVE METABOLIC PANEL
ALT: 20 U/L (ref 0–44)
AST: 26 U/L (ref 15–41)
Albumin: 4.1 g/dL (ref 3.5–5.0)
Alkaline Phosphatase: 82 U/L (ref 38–126)
Anion gap: 8 (ref 5–15)
BILIRUBIN TOTAL: 0.7 mg/dL (ref 0.3–1.2)
BUN: 11 mg/dL (ref 8–23)
CHLORIDE: 105 mmol/L (ref 98–111)
CO2: 26 mmol/L (ref 22–32)
Calcium: 8.9 mg/dL (ref 8.9–10.3)
Creatinine, Ser: 0.95 mg/dL (ref 0.61–1.24)
Glucose, Bld: 100 mg/dL — ABNORMAL HIGH (ref 70–99)
POTASSIUM: 4 mmol/L (ref 3.5–5.1)
Sodium: 139 mmol/L (ref 135–145)
TOTAL PROTEIN: 6.9 g/dL (ref 6.5–8.1)

## 2018-10-27 LAB — LACTATE DEHYDROGENASE: LDH: 196 U/L — ABNORMAL HIGH (ref 98–192)

## 2018-10-27 NOTE — Progress Notes (Signed)
[place] OFFICE PROGRESS NOTE  Patient Care Team: Dion Body, MD as PCP - General (Family Medicine)  Cancer Staging No matching staging information was found for the patient.   Oncology History   # 2014- POLYCYTHEMIA VERA - JAK-2 Positive; on Hydrea 500mg /day; aspirin 81 mg a day; phlebotomy for Hct >43 [headaches];   # MAY 2019- BMBx- pan-hypercellular; no evidence of significant fibrosis; MAY [place]- 1389 cc/ splenomegaly; Oct 11th 2019- Increase in splenomegaly; Increased Hydrea 1500mg /day.   # nonarteritic ischemic optic neuropathy-Left eye [may 2018; Dr.Dingledein/Dr.Sitko at [place]; Previous Right eye ---------------------------------------    DIAGNOSIS: [ ]  POLYCYTHEMIA VERA  ;GOALS: control  CURRENT/MOST RECENT THERAPY [ ]  Hydrea          Polycythemia vera (HCC)      INTERVAL HISTORY:  Derek [name] 62 y.o.  male pleasant patient above history of polycythemia vera is here for follow-up.   Since the last visit patient had ultrasound of the spleen-that showed increasing size of the spleen up to 1500 cc of volume.  Patient was recommended to go up on the dose of Hydrea to 1500 mg a day.  Patient tolerating Hydrea new dose fairly well.  No nausea no vomiting.  Denies any worsening abdominal pain loss of appetite or weight loss.  Denies any worsening fatigue.  He continues to work at [employer] without any problems.   Review of Systems  Constitutional: Negative for chills, diaphoresis, fever, malaise/fatigue and weight loss.  HENT: Negative for nosebleeds and sore throat.   Eyes: Negative for double vision.  Respiratory: Negative for cough, hemoptysis, sputum production, shortness of breath and wheezing.   Cardiovascular: Negative for chest pain, palpitations, orthopnea and leg swelling.  Gastrointestinal: Negative for abdominal pain, blood in stool, constipation, diarrhea, heartburn, melena, nausea and vomiting.  Genitourinary: Negative for  dysuria, frequency and urgency.  Musculoskeletal: Negative for back pain and joint pain.  Skin: Negative.  Negative for itching and rash.  Neurological: Negative for dizziness, tingling, focal weakness, weakness and headaches.  Endo/Heme/Allergies: Does not bruise/bleed easily.  Psychiatric/Behavioral: Negative for depression. The patient is not nervous/anxious and does not have insomnia.       PAST MEDICAL HISTORY :  Past Medical History:  Diagnosis Date  . Arthritis   . GERD (gastroesophageal reflux disease)   . Gout   . Low serum testosterone level   . Polycythemia vera ([place])   . Polycythemia, secondary 06/08/2015  . Pulmonary nodules   . Thoracic spine fracture (HCC)    compression fracture following a fall    PAST SURGICAL HISTORY :   Past Surgical History:  Procedure Laterality Date  . COLONOSCOPY  2013    FAMILY HISTORY :   Family History  Problem Relation Age of Onset  . Throat cancer Unknown        uncle  . Migraines Neg Hx   . Multiple sclerosis Neg Hx   . Neurofibromatosis Neg Hx   . Parkinsonism Neg Hx   . Seizures Neg Hx   . Neuropathy Neg Hx     SOCIAL HISTORY:   Social History   Tobacco Use  . Smoking status: Never Smoker  Substance Use Topics  . Alcohol use: No    Alcohol/week: 0.0 standard drinks    Comment: occasional alcohol use  . Drug use: No    ALLERGIES:  is allergic to penicillin g.  MEDICATIONS:  Current Outpatient Medications  Medication Sig Dispense Refill  . amLODipine (NORVASC) 5 MG tablet  Take 5 mg daily by mouth.  11  . aspirin EC 81 MG tablet Take 81 mg daily by mouth.     . Cholecalciferol (VITAMIN D-1000 MAX ST) 1000 units tablet Take 1,000 Units daily by mouth.     . Esomeprazole Magnesium (NEXIUM PO) Take 1 capsule by mouth daily.    . fluticasone (FLONASE) 50 MCG/ACT nasal spray     . hydroxyurea (HYDREA) 500 MG capsule Take 1 pill in the morning and 2 pills in the evening. May take with food to minimize GI side  effects. 90 capsule 3  . indomethacin (INDOCIN) 50 MG capsule Take 1 capsule by mouth as needed. GOUT  0  . Multiple Vitamins-Minerals (MULTIVITAMIN ADULT PO) Take 1 tablet by mouth 1 day or 1 dose.    . oxyCODONE-acetaminophen (PERCOCET/ROXICET) 5-325 MG tablet Take 1 tablet by mouth every 6 (six) hours as needed for pain. GOUT FLARE UP     No current facility-administered medications for this visit.     PHYSICAL EXAMINATION: ECOG PERFORMANCE STATUS: 0 - Asymptomatic  BP 134/84 (BP Location: Left Arm, Patient Position: Sitting)   Pulse 74   Temp 98.2 F (36.8 C) (Tympanic)   Resp 16   Wt 192 lb (87.1 kg)   BMI 28.35 kg/m   Filed Weights   10/27/18 1017  Weight: 192 lb (87.1 kg)    Physical Exam  Constitutional: He is oriented to person, place, and time and well-developed, well-nourished, and in no distress.  HENT:  Head: Normocephalic and atraumatic.  Mouth/Throat: Oropharynx is clear and moist. No oropharyngeal exudate.  Eyes: Pupils are equal, round, and reactive to light.  Neck: Normal range of motion. Neck supple.  Cardiovascular: Normal rate and regular rhythm.  Pulmonary/Chest: No respiratory distress. He has no wheezes.  Abdominal: Soft. Bowel sounds are normal. He exhibits no distension and no mass. There is no tenderness. There is no rebound and no guarding.  Positive for mild splenomegaly.  Musculoskeletal: Normal range of motion. He exhibits no edema or tenderness.  Neurological: He is alert and oriented to person, place, and time.  Skin: Skin is warm.  Psychiatric: Affect normal.       LABORATORY DATA:  I have reviewed the data as listed    Component Value Date/Time   NA 139 10/27/2018 0956   NA 140 03/02/2014 0835   K 4.0 10/27/2018 0956   K 3.8 03/02/2014 0835   CL 105 10/27/2018 0956   CL 109 (H) 03/02/2014 0835   CO2 26 10/27/2018 0956   CO2 26 03/02/2014 0835   GLUCOSE 100 (H) 10/27/2018 0956   GLUCOSE 108 (H) 03/02/2014 0835   BUN 11  10/27/2018 0956   BUN 9 03/02/2014 0835   CREATININE 0.95 10/27/2018 0956   CREATININE 0.99 03/02/2014 0835   CALCIUM 8.9 10/27/2018 0956   CALCIUM 8.6 03/02/2014 0835   PROT 6.9 10/27/2018 0956   PROT 7.0 03/02/2014 0835   ALBUMIN 4.1 10/27/2018 0956   ALBUMIN 3.7 03/02/2014 0835   AST 26 10/27/2018 0956   AST 20 03/02/2014 0835   ALT 20 10/27/2018 0956   ALT 24 03/02/2014 0835   ALKPHOS 82 10/27/2018 0956   ALKPHOS 96 03/02/2014 0835   BILITOT 0.7 10/27/2018 0956   BILITOT 0.6 03/02/2014 0835   GFRNONAA >60 10/27/2018 0956   GFRNONAA >60 03/02/2014 0835   GFRAA >60 10/27/2018 0956   GFRAA >60 03/02/2014 0835    No results found for: SPEP, UPEP  Lab Results  Component Value Date   WBC 9.4 10/27/2018   NEUTROABS 7.9 (H) 10/27/2018   HGB 12.6 (L) 10/27/2018   HCT 43.4 10/27/2018   MCV 80.7 10/27/2018   PLT 460 (H) 10/27/2018      Chemistry      Component Value Date/Time   NA 139 10/27/2018 0956   NA 140 03/02/2014 0835   K 4.0 10/27/2018 0956   K 3.8 03/02/2014 0835   CL 105 10/27/2018 0956   CL 109 (H) 03/02/2014 0835   CO2 26 10/27/2018 0956   CO2 26 03/02/2014 0835   BUN 11 10/27/2018 0956   BUN 9 03/02/2014 0835   CREATININE 0.95 10/27/2018 0956   CREATININE 0.99 03/02/2014 0835      Component Value Date/Time   CALCIUM 8.9 10/27/2018 0956   CALCIUM 8.6 03/02/2014 0835   ALKPHOS 82 10/27/2018 0956   ALKPHOS 96 03/02/2014 0835   AST 26 10/27/2018 0956   AST 20 03/02/2014 0835   ALT 20 10/27/2018 0956   ALT 24 03/02/2014 0835   BILITOT 0.7 10/27/2018 0956   BILITOT 0.6 03/02/2014 0835       RADIOGRAPHIC STUDIES: I have personally reviewed the radiological images as listed and agreed with the findings in the report. No results found.   ASSESSMENT & PLAN:  Polycythemia vera ([place]) # Polycythemia vera Jak 2+; oct 2019- US spleen increase in size/volume. Currently on Hydrea 1500 mg/day; aspirin once a day.  Counts improved; stable.  # continue the  hydrea at 1500 mg/day; patient tolerating well.  Again discussed the role of Jakafi-polycythemia resistant to Hydrea [3 g a day] or intolerance.  # Left foot pain-question stress fracture; on  NSAIDs for now.stable.   # "Stroke"- left eye- nonarteritic ischemic optic neuropathy.  Unrelated to polycythemia vera ; stable.  # DISPOSITION:  # cancel dec appts.  # Follow up with MD/Labs- [cbc/ldh/bmp] 2 months; US spleen prior.   Orders Placed This Encounter  Procedures  . US SPLEEN (ABDOMEN LIMITED)    Standing Status:   Future    Standing Expiration Date:   12/28/2019    Order Specific Question:   Reason for Exam (SYMPTOM  OR DIAGNOSIS REQUIRED)    Answer:   splenomegaly    Order Specific Question:   Preferred imaging location?    Answer:   [facility]   All questions were answered. The patient knows to call the clinic with any problems, questions or concerns.      Cammie Sickle, MD 10/27/2018 1:10 PM

## 2018-10-27 NOTE — Assessment & Plan Note (Addendum)
#   Polycythemia vera Jak 2+; oct 2019- US spleen increase in size/volume. Currently on Hydrea 1500 mg/day; aspirin once a day.  Counts improved; stable.  # continue the hydrea at 1500 mg/day; patient tolerating well.  Again discussed the role of Jakafi-polycythemia resistant to Hydrea [3 g a day] or intolerance.  # Left foot pain-question stress fracture; on  NSAIDs for now.stable.   # "Stroke"- left eye- nonarteritic ischemic optic neuropathy.  Unrelated to polycythemia vera ; stable.  # DISPOSITION:  # cancel dec appts.  # Follow up with MD/Labs- [cbc/ldh/bmp] 2 months; US spleen prior.

## 2018-11-11 ENCOUNTER — Telehealth: Payer: Self-pay | Admitting: *Deleted

## 2018-11-11 ENCOUNTER — Inpatient Hospital Stay (HOSPITAL_BASED_OUTPATIENT_CLINIC_OR_DEPARTMENT_OTHER): Payer: Managed Care, Other (non HMO) | Admitting: Nurse Practitioner

## 2018-11-11 VITALS — BP 145/79 | HR 83 | Temp 96.8°F | Resp 20 | Wt 196.0 lb

## 2018-11-11 DIAGNOSIS — M79672 Pain in left foot: Secondary | ICD-10-CM | POA: Diagnosis not present

## 2018-11-11 DIAGNOSIS — Z7982 Long term (current) use of aspirin: Secondary | ICD-10-CM

## 2018-11-11 DIAGNOSIS — L309 Dermatitis, unspecified: Secondary | ICD-10-CM

## 2018-11-11 DIAGNOSIS — D45 Polycythemia vera: Secondary | ICD-10-CM | POA: Diagnosis not present

## 2018-11-11 DIAGNOSIS — Z79899 Other long term (current) drug therapy: Secondary | ICD-10-CM | POA: Diagnosis not present

## 2018-11-11 MED ORDER — TRIAMCINOLONE ACETONIDE 0.025 % EX CREA: 1.0000 "application " | TOPICAL_CREAM | Freq: Two times a day (BID) | CUTANEOUS | 1 refills | Status: DC

## 2018-11-11 NOTE — Telephone Encounter (Signed)
Patient called and reports that he has a rash behind his right knee that is not going away despite topical treatment with over the counter medications.He is concerned that it may be a sign of something serious as he googled it on the internet and lymphoma was mentions. Appointment accepted for [facility] NP today.

## 2018-11-11 NOTE — Patient Instructions (Addendum)
Derek [name],   Please reduce Hydrea to 1 pill (500 mg) twice a day. I have sent a prescription for kenalog/Triamcinolone cream to your pharmacy for your rash. Dr. Rogue Bussing will see you back in 2 weeks to re-evaluate your symptoms and re-check your labs. It was a pleasure meeting you today and thank you for allowing me to participate in your care. Beckey Rutter, NP  Eczema Eczema is a broad term for a group of skin conditions that cause skin to become rough and inflamed. Each type of eczema has different triggers, symptoms, and treatments. Eczema of any type is usually itchy and symptoms range from mild to severe. Eczema and its symptoms are not spread from person to person (are not contagious). It can appear on different parts of the body at different times. Your eczema may not look the same as someone else's eczema. What are the types of eczema? Atopic dermatitis This is a long-term (chronic) skin disease that keeps coming back (recurring). Usual symptoms are dry skin and small, solid pimples that may swell and leak fluid (weep). Contact dermatitis This happens when something irritates the skin and causes a rash. The irritation can come from substances that you are allergic to (allergens), such as poison ivy, chemicals, or medicines that were applied to your skin. Dyshidrotic eczema This is a form of eczema on the hands and feet. It shows up as very itchy, fluid-filled blisters. It can affect people of any age, but is more common before age 62. Hand eczema This causes very itchy areas of skin on the palms and sides of the hands and fingers. This type of eczema is common in industrial jobs where you may be exposed to many different types of irritants. Lichen simplex chronicus This type of eczema occurs when a person constantly scratches one area of the body. Repeated scratching of the area leads to thickened skin (lichenification). Lichen simplex chronicus can occur along with other types of  eczema. It is more common in adults, but may be seen in children as well. Nummular eczema This is a common type of eczema. It has no known cause. It typically causes a red, circular, crusty lesion (plaque) that may be itchy. Scratching may become a habit and can cause bleeding. Nummular eczema occurs most often in people of middle-age or older. It most often affects the hands. Seborrheic dermatitis This is a common skin disease that mainly affects the scalp. It may also affect any oily areas of the body, such as the face, sides of nose, eyebrows, ears, eyelids, and chest. It is marked by small scaling and redness of the skin (erythema). This can affect people of all ages. In infants, this condition is known as Chart[employer] certified accountant." Stasis dermatitis This is a common skin disease that usually appears on the legs and feet. It most often occurs in people who have a condition that prevents blood from being pumped through the veins in the legs (chronic venous insufficiency). Stasis dermatitis is a chronic condition that needs long-term management. How is eczema diagnosed? Your health care provider will examine your skin and review your medical history. He or she may also give you skin patch tests. These tests involve taking patches that contain possible allergens and placing them on your back. He or she will then check in a few days to see if an allergic reaction occurred. What are the common treatments? Treatment for eczema is based on the type of eczema you have. Hydrocortisone steroid medicine can relieve  itching quickly and help reduce inflammation. This medicine may be prescribed or obtained over-the-counter, depending on the strength of the medicine that is needed. Follow these instructions at home:  Take over-the-counter and prescription medicines only as told by your health care provider.  Use creams or ointments to moisturize your skin. Do not use lotions.  Learn what triggers or irritates your  symptoms. Avoid these things.  Treat symptom flare-ups quickly.  Do not itch your skin. This can make your rash worse.  Keep all follow-up visits as told by your health care provider. This is important. Where to find more information:  The American Academy of Dermatology: http://jones-macias.info/  The National Eczema Association: www.nationaleczema.org Contact a health care provider if:  You have serious itching, even with treatment.  You regularly scratch your skin until it bleeds.  Your rash looks different than usual.  Your skin is painful, swollen, or more red than usual.  You have a fever. Summary  There are eight general types of eczema. Each type has different triggers.  Eczema of any type causes itching that may range from mild to severe.  Treatment varies based on the type of eczema you have. Hydrocortisone steroid medicine can help with itching and inflammation.  Protecting your skin is the best way to prevent eczema. Use moisturizers and lotions. Avoid triggers and irritants, and treat flare-ups quickly. This information is not intended to replace advice given to you by your health care provider. Make sure you discuss any questions you have with your health care provider. Document Released: 04/18/2017 Document Revised: 04/18/2017 Document Reviewed: 04/18/2017 Elsevier Interactive Patient Education  2018 [employer].

## 2018-11-11 NOTE — Progress Notes (Signed)
[facility]pt[facility]  Telephone:([phone] [phone]  Patient Care Team: Dion Body, MD as PCP - General (Family Medicine)   Name of the patient: Derek [name]  [id number]  July 12, 1956   Date of visit: 11/11/18  Diagnosis- PCV  Chief complaint/ Reason for visit- Rash and foot pain  Heme/Onc history:  Oncology History   # 2014- POLYCYTHEMIA VERA - JAK-2 Positive; on Hydrea 500mg /day; aspirin 81 mg a day; phlebotomy for Hct >43 [headaches];   # MAY 2019- BMBx- pan-hypercellular; no evidence of significant fibrosis; MAY [place]- 1389 cc/ splenomegaly; Oct 11th 2019- Increase in splenomegaly; Increased Hydrea 1500mg /day.   # nonarteritic ischemic optic neuropathy-Left eye [may 2018; Dr.Dingledein/Dr.Sitko at [facility]; Previous Right eye ---------------------------------------    DIAGNOSIS: [ ]  POLYCYTHEMIA VERA  ;GOALS: control  CURRENT/MOST RECENT THERAPY [ ]  Hydrea          Polycythemia vera ([place])    Interval history- Derek [name], 62 year old male, with above history of PCV, currently on Hydrea, who presents to symptom management clinic for rash.  Previously, ultrasound had shown spleen increasing in size and Hydrea was increased to 1500 mg/day.  He is tolerating new dose well without nausea or vomiting.  He complains that rash started approximately 2 months ago localized to groin, buttocks.  He thought that symptoms were reflective of "jock itch" and has been applying OTC antifungals without improvement in symptoms.  Excellently 1 month ago he developed rash on back of right knee.  He has been applying OTC topical steroid and antibiotic creams without improvement in symptoms.  Nothing seems to make symptoms better or worse.  He has not sought evaluation for symptoms previously. Additionally, he complains of pain in top of his left midfoot.  He states he has had this pain for several months and intermittently has episodes of  pain and swelling.  He previously sought evaluation for this from PCP who ordered imaging and diagnosed him with gout.  He says "I was here so thought I would mention it".  ECOG FS:1 - Symptomatic but completely ambulatory  Review of systems- Review of Systems  Constitutional: Negative for chills, fever, malaise/fatigue and weight loss.  HENT: Negative for hearing loss, nosebleeds, sore throat and tinnitus.   Eyes: Negative for blurred vision and double vision.  Respiratory: Negative for cough, hemoptysis, shortness of breath and wheezing.   Cardiovascular: Negative for chest pain, palpitations and leg swelling.  Gastrointestinal: Negative for abdominal pain, blood in stool, constipation, diarrhea, melena, nausea and vomiting.  Genitourinary: Negative for dysuria and urgency.  Musculoskeletal: Negative for back pain, falls, joint pain and myalgias.       Left foot pain  Skin: Positive for itching (not significantly itchy) and rash.  Neurological: Negative for dizziness, tingling, sensory change, loss of consciousness, weakness and headaches.  Endo/Heme/Allergies: Negative for environmental allergies. Does not bruise/bleed easily.  Psychiatric/Behavioral: Negative for depression. The patient is nervous/anxious. The patient does not have insomnia.      Current treatment- Hydrea 1500mg  daily (500mg  in morning and 1000mg  nightly)  Allergies  Allergen Reactions  . Penicillin G     Other reaction(s): Localized superficial swelling of skin    Past Medical History:  Diagnosis Date  . Arthritis   . GERD (gastroesophageal reflux disease)   . Gout   . Low serum testosterone level   . Polycythemia vera ([place])   . Polycythemia, secondary 06/08/2015  . Pulmonary nodules   . Thoracic spine fracture ([place])  compression fracture following a fall    Past Surgical History:  Procedure Laterality Date  . COLONOSCOPY  2013    Social History   Socioeconomic History  . Marital status:  Divorced    Spouse name: Not on file  . Number of children: 2  . Years of education: 74  . Highest education level: Not on file  Occupational History  . Occupation: [employer]  Social Needs  . Financial resource strain: Not on file  . Food insecurity:    Worry: Not on file    Inability: Not on file  . Transportation needs:    Medical: Not on file    Non-medical: Not on file  Tobacco Use  . Smoking status: Never Smoker  Substance and Sexual Activity  . Alcohol use: No    Alcohol/week: 0.0 standard drinks    Comment: occasional alcohol use  . Drug use: No  . Sexual activity: Not on file  Lifestyle  . Physical activity:    Days per week: Not on file    Minutes per session: Not on file  . Stress: Not on file  Relationships  . Social connections:    Talks on phone: Not on file    Gets together: Not on file    Attends religious service: Not on file    Active member of club or organization: Not on file    Attends meetings of clubs or organizations: Not on file    Relationship status: Not on file  . Intimate partner violence:    Fear of current or ex partner: Not on file    Emotionally abused: Not on file    Physically abused: Not on file    Forced sexual activity: Not on file  Other Topics Concern  . Not on file  Social History Narrative   Lives alone   Caffeine use:  Very little per pt   1 glass tea.day    Family History  Problem Relation Age of Onset  . Throat cancer Unknown        uncle  . Migraines Neg Hx   . Multiple sclerosis Neg Hx   . Neurofibromatosis Neg Hx   . Parkinsonism Neg Hx   . Seizures Neg Hx   . Neuropathy Neg Hx      Current Outpatient Medications:  .  amLODipine (NORVASC) 5 MG tablet, Take 5 mg daily by mouth., Disp: , Rfl: 11 .  aspirin EC 81 MG tablet, Take 81 mg daily by mouth. , Disp: , Rfl:  .  Cholecalciferol (VITAMIN D-1000 MAX ST) 1000 units tablet, Take 1,000 Units daily by mouth. , Disp: , Rfl:  .  Esomeprazole Magnesium  (NEXIUM PO), Take 1 capsule by mouth daily., Disp: , Rfl:  .  fluticasone (FLONASE) 50 MCG/ACT nasal spray, , Disp: , Rfl:  .  hydroxyurea (HYDREA) 500 MG capsule, Take 1 pill in the morning and 2 pills in the evening. May take with food to minimize GI side effects., Disp: 90 capsule, Rfl: 3 .  indomethacin (INDOCIN) 50 MG capsule, Take 1 capsule by mouth as needed. GOUT, Disp: , Rfl: 0 .  Multiple Vitamins-Minerals (MULTIVITAMIN ADULT PO), Take 1 tablet by mouth 1 day or 1 dose., Disp: , Rfl:  .  oxyCODONE-acetaminophen (PERCOCET/ROXICET) 5-325 MG tablet, Take 1 tablet by mouth every 6 (six) hours as needed for pain. GOUT FLARE UP, Disp: , Rfl:   Physical exam:  Vitals:   11/11/18 1112 11/11/18 1118  BP: (!) 145/79  Pulse: 83   Resp: 20   Temp:  (!) 96.8 F (36 C)  TempSrc: Oral Tympanic  SpO2: 97%   Weight: 196 lb (88.9 kg)    Physical Exam  Constitutional: He is oriented to person, place, and time. He appears well-developed and well-nourished. No distress.  HENT:  Head: Normocephalic and atraumatic.  Nose: Nose normal.  Mouth/Throat: Oropharynx is clear and moist. No oropharyngeal exudate.  Eyes: Pupils are equal, round, and reactive to light. Conjunctivae and EOM are normal. No scleral icterus.  Neck: Normal range of motion. Neck supple.  Cardiovascular: Normal rate, regular rhythm and normal heart sounds.  Pulmonary/Chest: Effort normal and breath sounds normal. He has no wheezes.  Abdominal: Soft. Bowel sounds are normal. He exhibits no distension. There is no tenderness.  Musculoskeletal: He exhibits no edema.  Left dorsal surface- mid-foot- Tenderness, inflammation localized; weight bearing  Neurological: He is alert and oriented to person, place, and time.  Skin: Skin is warm and dry. Rash noted.  See attached images- rash extends to groin and scrotum  Psychiatric: He has a normal mood and affect. His behavior is normal.          CMP Latest Ref Rng & Units  10/27/2018  Glucose 70 - 99 mg/dL 100(H)  BUN 8 - 23 mg/dL 11  Creatinine 0.61 - 1.24 mg/dL 0.95  Sodium 135 - 145 mmol/L 139  Potassium 3.5 - 5.1 mmol/L 4.0  Chloride 98 - 111 mmol/L 105  CO2 22 - 32 mmol/L 26  Calcium 8.9 - 10.3 mg/dL 8.9  Total Protein 6.5 - 8.1 g/dL 6.9  Total Bilirubin 0.3 - 1.2 mg/dL 0.7  Alkaline Phos 38 - 126 U/L 82  AST 15 - 41 U/L 26  ALT 0 - 44 U/L 20   CBC Latest Ref Rng & Units 10/27/2018  WBC 4.0 - 10.5 K/uL 9.4  Hemoglobin 13.0 - 17.0 g/dL 12.6(L)  Hematocrit 39.0 - 52.0 % 43.4  Platelets 150 - 400 K/uL 460(H)   No images are attached to the encounter.  No results found.  Assessment and plan- Patient is a 62 y.o. male diagnosed with polycythemia vera who presents to symptom management clinic for rash and foot pain.  1.  Polycythemia vera-Jak 2+: 09/2008 ultrasound of spleen showed increase in size and volume.  Hydrea increased at that time to 1500 mg/day.  Will dose reduced to 500 mg twice daily (1000 mg total) in setting of skin side effect (see below).    2.  Dermatitis-likely related to Hydrea- questioning eczema-like. Start triamcinolone 0.025% apply topically twice daily. Stop OTC antifungals.   rtc in 2 weeks for labs (cbc, cmp, ldh) and re-evaluation with Dr. Rogue Bussing   Visit Diagnosis 1. Polycythemia vera ([place])   2. Dermatitis     Patient expressed understanding and was in agreement with this plan. He also understands that He can call clinic at any time with any questions, concerns, or complaints.   Thank you for allowing me to participate in the care of this very pleasant patient.   Beckey Rutter, DNP, AGNP-C [place] at [facility] [phone] (work cell) [phone] (office)

## 2018-11-12 ENCOUNTER — Encounter: Payer: Self-pay | Admitting: Nurse Practitioner

## 2018-11-27 ENCOUNTER — Encounter: Payer: Self-pay | Admitting: Internal Medicine

## 2018-11-27 ENCOUNTER — Inpatient Hospital Stay: Payer: Managed Care, Other (non HMO) | Attending: Internal Medicine

## 2018-11-27 ENCOUNTER — Inpatient Hospital Stay (HOSPITAL_BASED_OUTPATIENT_CLINIC_OR_DEPARTMENT_OTHER): Payer: Managed Care, Other (non HMO) | Admitting: Internal Medicine

## 2018-11-27 VITALS — BP 146/78 | HR 77 | Temp 97.1°F | Resp 16 | Wt 195.4 lb

## 2018-11-27 DIAGNOSIS — Z79899 Other long term (current) drug therapy: Secondary | ICD-10-CM | POA: Insufficient documentation

## 2018-11-27 DIAGNOSIS — R21 Rash and other nonspecific skin eruption: Secondary | ICD-10-CM

## 2018-11-27 DIAGNOSIS — Z7982 Long term (current) use of aspirin: Secondary | ICD-10-CM

## 2018-11-27 DIAGNOSIS — D45 Polycythemia vera: Secondary | ICD-10-CM | POA: Diagnosis present

## 2018-11-27 LAB — COMPREHENSIVE METABOLIC PANEL
ALT: 16 U/L (ref 0–44)
AST: 25 U/L (ref 15–41)
Albumin: 4.1 g/dL (ref 3.5–5.0)
Alkaline Phosphatase: 99 U/L (ref 38–126)
Anion gap: 4 — ABNORMAL LOW (ref 5–15)
BUN: 15 mg/dL (ref 8–23)
CO2: 26 mmol/L (ref 22–32)
Calcium: 9.1 mg/dL (ref 8.9–10.3)
Chloride: 108 mmol/L (ref 98–111)
Creatinine, Ser: 0.87 mg/dL (ref 0.61–1.24)
GFR calc Af Amer: >60 mL/min (ref >60)
GFR calc non Af Amer: >60 mL/min (ref >60)
GLUCOSE: 85 mg/dL (ref 70–99)
Potassium: 4 mmol/L (ref 3.5–5.1)
Sodium: 138 mmol/L (ref 135–145)
Total Bilirubin: 0.8 mg/dL (ref 0.3–1.2)
Total Protein: 7 g/dL (ref 6.5–8.1)

## 2018-11-27 LAB — CBC WITH DIFFERENTIAL/PLATELET
Abs Immature Granulocytes: 0.09 10*3/uL — ABNORMAL HIGH (ref 0.00–0.07)
Basophils Absolute: 0.1 10*3/uL (ref 0.0–0.1)
Basophils Relative: 1 %
EOS ABS: 0.2 10*3/uL (ref 0.0–0.5)
Eosinophils Relative: 2 %
HCT: 43.9 % (ref 39.0–52.0)
Hemoglobin: 13.1 g/dL (ref 13.0–17.0)
Immature Granulocytes: 1 %
Lymphocytes Relative: 6 %
Lymphs Abs: 0.8 10*3/uL (ref 0.7–4.0)
MCH: 25.9 pg — ABNORMAL LOW (ref 26.0–34.0)
MCHC: 29.8 g/dL — ABNORMAL LOW (ref 30.0–36.0)
MCV: 86.9 fL (ref 80.0–100.0)
Monocytes Absolute: 0.3 10*3/uL (ref 0.1–1.0)
Monocytes Relative: 2 %
Neutro Abs: 12.4 10*3/uL — ABNORMAL HIGH (ref 1.7–7.7)
Neutrophils Relative %: 88 %
Platelets: 359 10*3/uL (ref 150–400)
RBC: 5.05 MIL/uL (ref 4.22–5.81)
WBC: 14 10*3/uL — ABNORMAL HIGH (ref 4.0–10.5)
nRBC: 0 % (ref 0.0–0.2)

## 2018-11-27 LAB — LACTATE DEHYDROGENASE: LDH: 213 U/L — ABNORMAL HIGH (ref 98–192)

## 2018-11-27 NOTE — Assessment & Plan Note (Addendum)
#   Polycythemia vera Jak 2+; oct 2019- US spleen increase in size/volume.  Currently on Hydrea 500 mg twice a day-given the skin rash [see discussion below]  # Continue the hydrea at 1000 mg/day; patient tolerating well-otherwise [question rash-see below].  If rash is attributed to Hydrea-then question use of Jakafi.  # rash- groin bil/ perineum ~9 m;  right behind knee rash- 3-4 months- worse; on steroids on kenalog oinment-question Hydrea related rash.  We will make a referral to dermatology.  # "Stroke"- left eye- nonarteritic ischemic optic neuropathy.  Unrelated to polycythemia vera stable.   # DISPOSITION:  # referral to Dr.Dasher/or his partners dermatology re: skin rash.  # Follow up as planned in jan 2019.

## 2018-11-27 NOTE — Progress Notes (Signed)
[facility] OFFICE PROGRESS NOTE  Patient Care Team: Dion Body, MD as PCP - General (Family Medicine)  Cancer Staging No matching staging information was found for the patient.   Oncology History   # 2014- POLYCYTHEMIA VERA - JAK-2 Positive; on Hydrea 500mg /day; aspirin 81 mg a day; phlebotomy for Hct >43 [headaches];   # MAY 2019- BMBx- pan-hypercellular; no evidence of significant fibrosis; MAY [place]- 1389 cc/ splenomegaly; Oct 11th 2019- Increase in splenomegaly; Increased Hydrea 1500mg /day.   # nonarteritic ischemic optic neuropathy-Left eye [may 2018; Dr.Dingledein/Dr.Sitko at [place]; Previous Right eye ---------------------------------------    DIAGNOSIS: [ ]  POLYCYTHEMIA VERA  ;GOALS: control  CURRENT/MOST RECENT THERAPY [ ]  Hydrea          Polycythemia vera ([facility])      INTERVAL HISTORY:  Derek [name] 62 y.o.  male pleasant patient above history of polycythemia vera is here for follow-up.   In the interim patient was evaluated in the symptomatic med clinic for his worsening rash in his groin/perineum; and also in the right behind the knee.   He states he has noticed a rash in his groin/perineum about 9 months ago.;  Noted to have a rash around the knee about 3 to 4 months ago.  Non-itchy.  Progressive getting worse.  Patient was given Kenalog ointment.  Which has been using.  Improved.  Not resolved.  No nausea no vomiting.  Weight is stable.  No unusual fatigue.  Review of Systems  Constitutional: Negative for chills, diaphoresis, fever, malaise/fatigue and weight loss.  HENT: Negative for nosebleeds and sore throat.   Eyes: Negative for double vision.  Respiratory: Negative for cough, hemoptysis, sputum production, shortness of breath and wheezing.   Cardiovascular: Negative for chest pain, palpitations, orthopnea and leg swelling.  Gastrointestinal: Negative for abdominal pain, blood in stool, constipation, diarrhea, heartburn, melena,  nausea and vomiting.  Genitourinary: Negative for dysuria, frequency and urgency.  Musculoskeletal: Negative for back pain and joint pain.  Skin: Positive for rash. Negative for itching.  Neurological: Negative for dizziness, tingling, focal weakness, weakness and headaches.  Endo/Heme/Allergies: Does not bruise/bleed easily.  Psychiatric/Behavioral: Negative for depression. The patient is not nervous/anxious and does not have insomnia.       PAST MEDICAL HISTORY :  Past Medical History:  Diagnosis Date  . Arthritis   . GERD (gastroesophageal reflux disease)   . Gout   . Low serum testosterone level   . Polycythemia vera ([place])   . Polycythemia, secondary 06/08/2015  . Pulmonary nodules   . Thoracic spine fracture ([facility])    compression fracture following a fall    PAST SURGICAL HISTORY :   Past Surgical History:  Procedure Laterality Date  . COLONOSCOPY  2013    FAMILY HISTORY :   Family History  Problem Relation Age of Onset  . Throat cancer Unknown        uncle  . Migraines Neg Hx   . Multiple sclerosis Neg Hx   . Neurofibromatosis Neg Hx   . Parkinsonism Neg Hx   . Seizures Neg Hx   . Neuropathy Neg Hx     SOCIAL HISTORY:   Social History   Tobacco Use  . Smoking status: Never Smoker  Substance Use Topics  . Alcohol use: No    Alcohol/week: 0.0 standard drinks    Comment: occasional alcohol use  . Drug use: No    ALLERGIES:  is allergic to penicillin g.  MEDICATIONS:  Current Outpatient Medications  Medication  Sig Dispense Refill  . amLODipine (NORVASC) 5 MG tablet Take 5 mg daily by mouth.  11  . aspirin EC 81 MG tablet Take 81 mg daily by mouth.     . Cholecalciferol (VITAMIN D-1000 MAX ST) 1000 units tablet Take 1,000 Units daily by mouth.     . Esomeprazole Magnesium (NEXIUM PO) Take 1 capsule by mouth daily.    . fluticasone (FLONASE) 50 MCG/ACT nasal spray     . hydroxyurea (HYDREA) 500 MG capsule Take 1 pill in the morning and 2 pills in the  evening. May take with food to minimize GI side effects. 90 capsule 3  . indomethacin (INDOCIN) 50 MG capsule Take 1 capsule by mouth as needed. GOUT  0  . Multiple Vitamins-Minerals (MULTIVITAMIN ADULT PO) Take 1 tablet by mouth 1 day or 1 dose.    . oxyCODONE-acetaminophen (PERCOCET/ROXICET) 5-325 MG tablet Take 1 tablet by mouth every 6 (six) hours as needed for pain. GOUT FLARE UP    . triamcinolone (KENALOG) 0.025 % cream Apply 1 application topically 2 (two) times daily. To affected area 80 g 1   No current facility-administered medications for this visit.     PHYSICAL EXAMINATION: ECOG PERFORMANCE STATUS: 0 - Asymptomatic  BP (!) 146/78 (BP Location: Left Arm, Patient Position: Sitting)   Pulse 77   Temp (!) 97.1 F (36.2 C) (Tympanic)   Resp 16   Wt 195 lb 6.4 oz (88.6 kg)   BMI 28.86 kg/m   Filed Weights   11/27/18 1101  Weight: 195 lb 6.4 oz (88.6 kg)    Physical Exam  Constitutional: He is oriented to person, place, and time and well-developed, well-nourished, and in no distress.  HENT:  Head: Normocephalic and atraumatic.  Mouth/Throat: Oropharynx is clear and moist. No oropharyngeal exudate.  Eyes: Pupils are equal, round, and reactive to light.  Neck: Normal range of motion. Neck supple.  Cardiovascular: Normal rate and regular rhythm.  Pulmonary/Chest: No respiratory distress. He has no wheezes.  Abdominal: Soft. Bowel sounds are normal. He exhibits no distension and no mass. There is no abdominal tenderness. There is no rebound and no guarding.  Positive for mild splenomegaly.  Musculoskeletal: Normal range of motion.        General: No tenderness or edema.  Neurological: He is alert and oriented to person, place, and time.  Skin: Skin is warm.  Maculopapular rash scaly-noted in the groin bilaterally perineum.  Also noted right posterior knee.  Psychiatric: Affect normal.       LABORATORY DATA:  I have reviewed the data as listed    Component Value  Date/Time   NA 138 11/27/2018 1040   NA 140 03/02/2014 0835   K 4.0 11/27/2018 1040   K 3.8 03/02/2014 0835   CL 108 11/27/2018 1040   CL 109 (H) 03/02/2014 0835   CO2 26 11/27/2018 1040   CO2 26 03/02/2014 0835   GLUCOSE 85 11/27/2018 1040   GLUCOSE 108 (H) 03/02/2014 0835   BUN 15 11/27/2018 1040   BUN 9 03/02/2014 0835   CREATININE 0.87 11/27/2018 1040   CREATININE 0.99 03/02/2014 0835   CALCIUM 9.1 11/27/2018 1040   CALCIUM 8.6 03/02/2014 0835   PROT 7.0 11/27/2018 1040   PROT 7.0 03/02/2014 0835   ALBUMIN 4.1 11/27/2018 1040   ALBUMIN 3.7 03/02/2014 0835   AST 25 11/27/2018 1040   AST 20 03/02/2014 0835   ALT 16 11/27/2018 1040   ALT 24 03/02/2014 0835  ALKPHOS 99 11/27/2018 1040   ALKPHOS 96 03/02/2014 0835   BILITOT 0.8 11/27/2018 1040   BILITOT 0.6 03/02/2014 0835   GFRNONAA >60 11/27/2018 1040   GFRNONAA >60 03/02/2014 0835   GFRAA >60 11/27/2018 1040   GFRAA >60 03/02/2014 0835    No results found for: SPEP, UPEP  Lab Results  Component Value Date   WBC 14.0 (H) 11/27/2018   NEUTROABS 12.4 (H) 11/27/2018   HGB 13.1 11/27/2018   HCT 43.9 11/27/2018   MCV 86.9 11/27/2018   PLT 359 11/27/2018      Chemistry      Component Value Date/Time   NA 138 11/27/2018 1040   NA 140 03/02/2014 0835   K 4.0 11/27/2018 1040   K 3.8 03/02/2014 0835   CL 108 11/27/2018 1040   CL 109 (H) 03/02/2014 0835   CO2 26 11/27/2018 1040   CO2 26 03/02/2014 0835   BUN 15 11/27/2018 1040   BUN 9 03/02/2014 0835   CREATININE 0.87 11/27/2018 1040   CREATININE 0.99 03/02/2014 0835      Component Value Date/Time   CALCIUM 9.1 11/27/2018 1040   CALCIUM 8.6 03/02/2014 0835   ALKPHOS 99 11/27/2018 1040   ALKPHOS 96 03/02/2014 0835   AST 25 11/27/2018 1040   AST 20 03/02/2014 0835   ALT 16 11/27/2018 1040   ALT 24 03/02/2014 0835   BILITOT 0.8 11/27/2018 1040   BILITOT 0.6 03/02/2014 0835       RADIOGRAPHIC STUDIES: I have personally reviewed the radiological images  as listed and agreed with the findings in the report. No results found.   ASSESSMENT & PLAN:  Polycythemia vera ([place]) # Polycythemia vera Jak 2+; oct 2019- US spleen increase in size/volume.  Currently on Hydrea 500 mg twice a day-given the skin rash [see discussion below]  # Continue the hydrea at 1000 mg/day; patient tolerating well-otherwise [question rash-see below].  If rash is attributed to Hydrea-then question use of Jakafi.  # rash- groin bil/ perineum ~9 m;  right behind knee rash- 3-4 months- worse; on steroids on kenalog oinment-question Hydrea related rash.  We will make a referral to dermatology.  # "Stroke"- left eye- nonarteritic ischemic optic neuropathy.  Unrelated to polycythemia vera stable.   # DISPOSITION:  # referral to Dr.Dasher/or his partners dermatology re: skin rash.  # Follow up as planned in jan 2019.    No orders of the defined types were placed in this encounter.  All questions were answered. The patient knows to call the clinic with any problems, questions or concerns.      Cammie Sickle, MD 11/27/2018 12:20 PM

## 2018-11-28 ENCOUNTER — Ambulatory Visit: Payer: Managed Care, Other (non HMO)

## 2018-12-01 ENCOUNTER — Ambulatory Visit: Payer: Managed Care, Other (non HMO) | Admitting: Internal Medicine

## 2018-12-01 ENCOUNTER — Other Ambulatory Visit: Payer: Managed Care, Other (non HMO)

## 2018-12-12 ENCOUNTER — Encounter: Payer: Self-pay | Admitting: Emergency Medicine

## 2018-12-12 ENCOUNTER — Other Ambulatory Visit: Payer: Self-pay

## 2018-12-12 ENCOUNTER — Emergency Department
Admission: EM | Admit: 2018-12-12 | Discharge: 2018-12-12 | Disposition: A | Discharge status: Home / Self Care | Payer: Managed Care, Other (non HMO) | Attending: Emergency Medicine | Admitting: Emergency Medicine

## 2018-12-12 ENCOUNTER — Emergency Department: Payer: Managed Care, Other (non HMO)

## 2018-12-12 DIAGNOSIS — Z7982 Long term (current) use of aspirin: Secondary | ICD-10-CM | POA: Insufficient documentation

## 2018-12-12 DIAGNOSIS — Z79899 Other long term (current) drug therapy: Secondary | ICD-10-CM | POA: Insufficient documentation

## 2018-12-12 DIAGNOSIS — M79605 Pain in left leg: Secondary | ICD-10-CM | POA: Insufficient documentation

## 2018-12-12 MED ORDER — DICLOFENAC SODIUM 1 % TD GEL: 2.0000 g | Freq: Four times a day (QID) | TRANSDERMAL | 0 refills | Status: DC

## 2018-12-12 NOTE — ED Provider Notes (Signed)
[facility] Emergency Department Provider Note  ____________________________________________   I have reviewed the triage vital signs and the nursing notes.   HISTORY  Chief Complaint Leg Pain   History limited by: Not Limited   HPI Derek [name] is a 62 y.o. male who presents to the emergency department today from primary care physician because of concerns for left calf pain and swelling.  The patient states that symptoms have been there for roughly 1 week.  He states this started after he was adjusting his gait secondary to left foot pain.  He feels like the swelling is actually a little bit better today than it had been previously.  He has been wearing a knee compression sleeve.  The patient denies any chest pain or shortness of breath.  Denies any fevers.   Per medical record review patient has a history of gout  Past Medical History:  Diagnosis Date  . Arthritis   . GERD (gastroesophageal reflux disease)   . Gout   . Low serum testosterone level   . Polycythemia vera ([place])   . Polycythemia, secondary 06/08/2015  . Pulmonary nodules   . Thoracic spine fracture (HCC)    compression fracture following a fall    Patient Active Problem List   Diagnosis Date Noted  . Arthritis 04/18/2016  . Acid reflux 04/18/2016  . Gout 04/18/2016  . Polycythemia vera ([place]) 04/18/2016  . Polycythemia, secondary 06/08/2015  . Conjunctival hyperemia 09/28/2013  . Cephalalgia 09/28/2013    Past Surgical History:  Procedure Laterality Date  . COLONOSCOPY  2013    Prior to Admission medications   Medication Sig Start Date End Date Taking? Authorizing Provider  amLODipine (NORVASC) 5 MG tablet Take 5 mg daily by mouth. 10/12/17   [provider]  aspirin EC 81 MG tablet Take 81 mg daily by mouth.     [provider]  Cholecalciferol (VITAMIN D-1000 MAX ST) 1000 units tablet Take 1,000 Units daily by mouth.     [provider]   Esomeprazole Magnesium (NEXIUM PO) Take 1 capsule by mouth daily.    [provider]  fluticasone Asencion Islam) 50 MCG/ACT nasal spray  07/29/14   [provider]  hydroxyurea (HYDREA) 500 MG capsule Take 1 pill in the morning and 2 pills in the evening. May take with food to minimize GI side effects. 09/30/18   Cammie Sickle, MD  indomethacin (INDOCIN) 50 MG capsule Take 1 capsule by mouth as needed. GOUT 04/02/18   [provider]  Multiple Vitamins-Minerals (MULTIVITAMIN ADULT PO) Take 1 tablet by mouth 1 day or 1 dose.    [provider]  oxyCODONE-acetaminophen (PERCOCET/ROXICET) 5-325 MG tablet Take 1 tablet by mouth every 6 (six) hours as needed for pain. GOUT FLARE UP 04/02/18   [provider]  triamcinolone (KENALOG) 0.025 % cream Apply 1 application topically 2 (two) times daily. To affected area 11/11/18   Verlon Au, NP    Allergies Penicillin g  Family History  Problem Relation Age of Onset  . Throat cancer Other        uncle  . Migraines Neg Hx   . Multiple sclerosis Neg Hx   . Neurofibromatosis Neg Hx   . Parkinsonism Neg Hx   . Seizures Neg Hx   . Neuropathy Neg Hx     Social History Social History   Tobacco Use  . Smoking status: Never Smoker  Substance Use Topics  . Alcohol use: No  Alcohol/week: 0.0 standard drinks    Comment: occasional alcohol use  . Drug use: No    Review of Systems Constitutional: No fever/chills Eyes: No visual changes. ENT: No sore throat. Cardiovascular: Denies chest pain. Respiratory: Denies shortness of breath. Gastrointestinal: No abdominal pain.  No nausea, no vomiting.  No diarrhea.   Genitourinary: Negative for dysuria. Musculoskeletal: Positive for left calf pain and swelling.  Skin: Negative for rash. Neurological: Negative for headaches, focal weakness or numbness.  ____________________________________________   PHYSICAL EXAM:  VITAL SIGNS: ED Triage Vitals   Enc Vitals Group     BP 12/12/18 1820 (!) 164/94     Pulse Rate 12/12/18 1820 92     Resp 12/12/18 1820 16     Temp 12/12/18 1820 98 F (36.7 C)     Temp Source 12/12/18 1820 Oral     SpO2 12/12/18 1820 98 %     Weight --      Height --      Head Circumference --      Peak Flow --      Pain Score 12/12/18 1816 5    Constitutional: Alert and oriented.  Eyes: Conjunctivae are normal.  ENT      Head: Normocephalic and atraumatic.      Nose: No congestion/rhinnorhea.      Mouth/Throat: Mucous membranes are moist.      Neck: No stridor. Hematological/Lymphatic/Immunilogical: No cervical lymphadenopathy. Cardiovascular: Normal rate, regular rhythm.  No murmurs, rubs, or gallops.  Respiratory: Normal respiratory effort without tachypnea nor retractions. Breath sounds are clear and equal bilaterally. No wheezes/rales/rhonchi. Gastrointestinal: Soft and non tender. No rebound. No guarding.  Genitourinary: Deferred Musculoskeletal: Normal range of motion in all extremities. Minimal tenderness to palpation of the left calf. Compartments soft.  Neurologic:  Normal speech and language. No gross focal neurologic deficits are appreciated.  Skin:  Skin is warm, dry and intact. No rash noted. Psychiatric: Mood and affect are normal. Speech and behavior are normal. Patient exhibits appropriate insight and judgment.  ____________________________________________    LABS (pertinent positives/negatives)  None  ____________________________________________   EKG  None  ____________________________________________    RADIOLOGY  [place] LLE No DVT  ____________________________________________   PROCEDURES  Procedures  ____________________________________________   INITIAL IMPRESSION / ASSESSMENT AND PLAN / ED COURSE  Pertinent labs & imaging results that were available during my care of the patient were reviewed by me and considered in my medical decision making (see chart for  details).   Patient sent to the emergency department from primary care physician's office because of concerns for possible DVT in the left leg.  Ultrasound was negative.  Patient does have some mild tenderness to the muscle of the left calf.  Do wonder if this could be secondary to change in use of the left leg secondary to gout flare of his left foot.  Discussed this with the patient.  Discussed rice with patient.   ____________________________________________   FINAL CLINICAL IMPRESSION(S) / ED DIAGNOSES  Final diagnoses:  Left leg pain     Note: This dictation was prepared with Dragon dictation. Any transcriptional errors that result from this process are unintentional     Nance Pear, MD 12/12/18 2103

## 2018-12-12 NOTE — Discharge Instructions (Signed)
Please seek medical attention for any high fevers, chest pain, shortness of breath, change in behavior, persistent vomiting, bloody stool or any other new or concerning symptoms.  

## 2018-12-12 NOTE — ED Triage Notes (Signed)
Pt to ED via POV from [facility] in for left leg pain and swelling. Pt is in NAD at this time.

## 2018-12-26 ENCOUNTER — Ambulatory Visit
Admission: RE | Admit: 2018-12-26 | Discharge: 2018-12-26 | Disposition: A | Discharge status: Home / Self Care | Payer: 59 | Source: Ambulatory Visit | Attending: Internal Medicine | Admitting: Internal Medicine

## 2018-12-26 DIAGNOSIS — D45 Polycythemia vera: Secondary | ICD-10-CM | POA: Diagnosis not present

## 2018-12-29 ENCOUNTER — Inpatient Hospital Stay (HOSPITAL_BASED_OUTPATIENT_CLINIC_OR_DEPARTMENT_OTHER): Payer: 59 | Admitting: Internal Medicine

## 2018-12-29 ENCOUNTER — Encounter: Payer: Self-pay | Admitting: Internal Medicine

## 2018-12-29 ENCOUNTER — Encounter (INDEPENDENT_AMBULATORY_CARE_PROVIDER_SITE_OTHER): Payer: Self-pay

## 2018-12-29 ENCOUNTER — Inpatient Hospital Stay: Payer: 59 | Attending: Internal Medicine | Admitting: *Deleted

## 2018-12-29 ENCOUNTER — Inpatient Hospital Stay: Payer: 59

## 2018-12-29 VITALS — BP 153/81 | HR 99 | Temp 97.9°F | Resp 16 | Wt 191.0 lb

## 2018-12-29 VITALS — BP 132/81 | HR 82 | Resp 18

## 2018-12-29 DIAGNOSIS — R21 Rash and other nonspecific skin eruption: Secondary | ICD-10-CM | POA: Insufficient documentation

## 2018-12-29 DIAGNOSIS — D45 Polycythemia vera: Secondary | ICD-10-CM | POA: Diagnosis not present

## 2018-12-29 DIAGNOSIS — M25561 Pain in right knee: Secondary | ICD-10-CM | POA: Insufficient documentation

## 2018-12-29 DIAGNOSIS — H47012 Ischemic optic neuropathy, left eye: Secondary | ICD-10-CM

## 2018-12-29 DIAGNOSIS — Z79899 Other long term (current) drug therapy: Secondary | ICD-10-CM | POA: Insufficient documentation

## 2018-12-29 DIAGNOSIS — M25562 Pain in left knee: Secondary | ICD-10-CM

## 2018-12-29 DIAGNOSIS — D751 Secondary polycythemia: Secondary | ICD-10-CM

## 2018-12-29 LAB — COMPREHENSIVE METABOLIC PANEL
ALT: 21 U/L (ref 0–44)
AST: 21 U/L (ref 15–41)
Albumin: 4 g/dL (ref 3.5–5.0)
Alkaline Phosphatase: 112 U/L (ref 38–126)
Anion gap: 8 (ref 5–15)
BUN: 15 mg/dL (ref 8–23)
CO2: 26 mmol/L (ref 22–32)
Calcium: 8.7 mg/dL — ABNORMAL LOW (ref 8.9–10.3)
Chloride: 106 mmol/L (ref 98–111)
Creatinine, Ser: 0.79 mg/dL (ref 0.61–1.24)
GFR calc non Af Amer: >60 mL/min (ref >60)
Glucose, Bld: 114 mg/dL — ABNORMAL HIGH (ref 70–99)
Potassium: 4.1 mmol/L (ref 3.5–5.1)
Sodium: 140 mmol/L (ref 135–145)
Total Bilirubin: 0.7 mg/dL (ref 0.3–1.2)
Total Protein: 7 g/dL (ref 6.5–8.1)

## 2018-12-29 LAB — CBC WITH DIFFERENTIAL/PLATELET
Abs Immature Granulocytes: 0.26 10*3/uL — ABNORMAL HIGH (ref 0.00–0.07)
BASOS ABS: 0.2 10*3/uL — AB (ref 0.0–0.1)
Basophils Relative: 1 %
Eosinophils Absolute: 0.3 10*3/uL (ref 0.0–0.5)
Eosinophils Relative: 2 %
HEMATOCRIT: 47.1 % (ref 39.0–52.0)
Hemoglobin: 14.2 g/dL (ref 13.0–17.0)
Immature Granulocytes: 1 %
LYMPHS ABS: 1 10*3/uL (ref 0.7–4.0)
Lymphocytes Relative: 6 %
MCH: 26.9 pg (ref 26.0–34.0)
MCHC: 30.1 g/dL (ref 30.0–36.0)
MCV: 89.2 fL (ref 80.0–100.0)
Monocytes Absolute: 0.5 10*3/uL (ref 0.1–1.0)
Monocytes Relative: 3 %
Neutro Abs: 16.2 10*3/uL — ABNORMAL HIGH (ref 1.7–7.7)
Neutrophils Relative %: 87 %
Platelets: 450 10*3/uL — ABNORMAL HIGH (ref 150–400)
RBC: 5.28 MIL/uL (ref 4.22–5.81)
RDW: 19.5 % — ABNORMAL HIGH (ref 11.5–15.5)
WBC: 18.4 10*3/uL — ABNORMAL HIGH (ref 4.0–10.5)
nRBC: 0 % (ref 0.0–0.2)

## 2018-12-29 LAB — URIC ACID: Uric Acid, Serum: 6.9 mg/dL (ref 3.7–8.6)

## 2018-12-29 LAB — LACTATE DEHYDROGENASE: LDH: 191 U/L (ref 98–192)

## 2018-12-29 NOTE — Assessment & Plan Note (Addendum)
#   Polycythemia vera Jak 2+; on Hydrea 500 mg twice a day; ultrasound January 2020-improvement of the volume to 1100 cc [previously 1500 cc].  LDH improving; mildly elevated white count at 18,000.  Overall stable.   #Continue Hydrea-500 mg twice a day; tolerating well except for skin rash [see discussion below].  Hematocrit is 47 proceed with phlebotomy today.  #Skin rash groin perineum/right behind the knee-9 to 10 months non-itchy; question Hydrea versus others.  We will make a referral to Dr. Evorn Gong again.  I left a message myself.  # "Stroke"- left eye- nonarteritic ischemic optic neuropathy.  Unrelated to polycythemia vera -stable.  # Bil Knee pain s/p steroid injection [Dr.Miller]; awaiting uric acid levels.   # DISPOSITION:  # Phlebotomy today # Referral to Dr.Dasher/or his partners dermatology re: skin rash.  # Follow up in 3 months-MD/labs- cbc/cmp/ldh- possible phlebotomy-Dr.B

## 2018-12-29 NOTE — Progress Notes (Signed)
[facility] OFFICE PROGRESS NOTE  Patient Care Team: Dion Body, MD as PCP - General (Family Medicine)  Cancer Staging No matching staging information was found for the patient.   Oncology History   # 2014- POLYCYTHEMIA VERA - JAK-2 Positive; on Hydrea 500mg /day; aspirin 81 mg a day; phlebotomy for Hct >43 [headaches];   # MAY 2019- BMBx- pan-hypercellular; no evidence of significant fibrosis; MAY [place]- 1389 cc/ splenomegaly; Oct 11th 2019- Increase in splenomegaly; Increased Hydrea 1500mg /day.   # nonarteritic ischemic optic neuropathy-Left eye [may 2018; Dr.Dingledein/Dr.Sitko at [place]; Previous Right eye ---------------------------------------    DIAGNOSIS: [ ]  POLYCYTHEMIA VERA  ;GOALS: control  CURRENT/MOST RECENT THERAPY [ ]  Hydrea          Polycythemia vera (HCC)      INTERVAL HISTORY:  Derek [name] 63 y.o.  male pleasant patient above history of polycythemia vera is here for follow-up.   Patient continues to have skin rash in his groin perineum and also behind right knee.  Is not any itchy.  Is not getting any worse.  Slight improvement on Kenalog ointment.  He has not heard back from dermatology yet.  Patient was recently evaluated by orthopedics for his bilateral knee pain.  He had injections done bilaterally.  He is concerned about gout.  Review of Systems  Constitutional: Negative for chills, diaphoresis, fever, malaise/fatigue and weight loss.  HENT: Negative for nosebleeds and sore throat.   Eyes: Negative for double vision.  Respiratory: Negative for cough, hemoptysis, sputum production, shortness of breath and wheezing.   Cardiovascular: Negative for chest pain, palpitations, orthopnea and leg swelling.  Gastrointestinal: Negative for abdominal pain, blood in stool, constipation, diarrhea, heartburn, melena, nausea and vomiting.  Genitourinary: Negative for dysuria, frequency and urgency.  Musculoskeletal: Negative for back pain and  joint pain.  Skin: Positive for rash. Negative for itching.  Neurological: Negative for dizziness, tingling, focal weakness, weakness and headaches.  Endo/Heme/Allergies: Does not bruise/bleed easily.  Psychiatric/Behavioral: Negative for depression. The patient is not nervous/anxious and does not have insomnia.       PAST MEDICAL HISTORY :  Past Medical History:  Diagnosis Date  . Arthritis   . GERD (gastroesophageal reflux disease)   . Gout   . Low serum testosterone level   . Polycythemia vera ([place])   . Polycythemia, secondary 06/08/2015  . Pulmonary nodules   . Thoracic spine fracture (HCC)    compression fracture following a fall    PAST SURGICAL HISTORY :   Past Surgical History:  Procedure Laterality Date  . COLONOSCOPY  2013    FAMILY HISTORY :   Family History  Problem Relation Age of Onset  . Throat cancer Other        uncle  . Migraines Neg Hx   . Multiple sclerosis Neg Hx   . Neurofibromatosis Neg Hx   . Parkinsonism Neg Hx   . Seizures Neg Hx   . Neuropathy Neg Hx     SOCIAL HISTORY:   Social History   Tobacco Use  . Smoking status: Never Smoker  . Smokeless tobacco: Never Used  Substance Use Topics  . Alcohol use: No    Alcohol/week: 0.0 standard drinks    Comment: occasional alcohol use  . Drug use: No    ALLERGIES:  is allergic to penicillin g.  MEDICATIONS:  Current Outpatient Medications  Medication Sig Dispense Refill  . amLODipine (NORVASC) 5 MG tablet Take 5 mg daily by mouth.  11  . aspirin  EC 81 MG tablet Take 81 mg daily by mouth.     . Cholecalciferol (VITAMIN D-1000 MAX ST) 1000 units tablet Take 1,000 Units daily by mouth.     . diclofenac sodium (VOLTAREN) 1 % GEL Apply 2 g topically 4 (four) times daily. 100 g 0  . Esomeprazole Magnesium (NEXIUM PO) Take 1 capsule by mouth daily.    . fluticasone (FLONASE) 50 MCG/ACT nasal spray     . hydroxyurea (HYDREA) 500 MG capsule Take 1 pill in the morning and 2 pills in the evening.  May take with food to minimize GI side effects. 90 capsule 3  . indomethacin (INDOCIN) 50 MG capsule Take 1 capsule by mouth as needed. GOUT  0  . Multiple Vitamins-Minerals (MULTIVITAMIN ADULT PO) Take 1 tablet by mouth 1 day or 1 dose.    . oxyCODONE-acetaminophen (PERCOCET/ROXICET) 5-325 MG tablet Take 1 tablet by mouth every 6 (six) hours as needed for pain. GOUT FLARE UP    . triamcinolone (KENALOG) 0.025 % cream Apply 1 application topically 2 (two) times daily. To affected area 80 g 1   No current facility-administered medications for this visit.     PHYSICAL EXAMINATION: ECOG PERFORMANCE STATUS: 0 - Asymptomatic  BP (!) 153/81 (BP Location: Left Arm, Patient Position: Sitting, Cuff Size: Normal)   Pulse 99   Temp 97.9 F (36.6 C) (Tympanic)   Resp 16   Wt 191 lb (86.6 kg)   BMI 28.21 kg/m   Filed Weights   12/29/18 1431  Weight: 191 lb (86.6 kg)    Physical Exam  Constitutional: He is oriented to person, place, and time and well-developed, well-nourished, and in no distress.  HENT:  Head: Normocephalic and atraumatic.  Mouth/Throat: Oropharynx is clear and moist. No oropharyngeal exudate.  Eyes: Pupils are equal, round, and reactive to light.  Neck: Normal range of motion. Neck supple.  Cardiovascular: Normal rate and regular rhythm.  Pulmonary/Chest: No respiratory distress. He has no wheezes.  Abdominal: Soft. Bowel sounds are normal. He exhibits no distension and no mass. There is no abdominal tenderness. There is no rebound and no guarding.  Positive for mild splenomegaly.  Musculoskeletal: Normal range of motion.        General: No tenderness or edema.  Neurological: He is alert and oriented to person, place, and time.  Skin: Skin is warm.  Maculopapular rash scaly-noted in the groin bilaterally perineum.  Also noted right posterior knee.  Psychiatric: Affect normal.       LABORATORY DATA:  I have reviewed the data as listed    Component Value Date/Time    NA 140 12/29/2018 1351   NA 140 03/02/2014 0835   K 4.1 12/29/2018 1351   K 3.8 03/02/2014 0835   CL 106 12/29/2018 1351   CL 109 (H) 03/02/2014 0835   CO2 26 12/29/2018 1351   CO2 26 03/02/2014 0835   GLUCOSE 114 (H) 12/29/2018 1351   GLUCOSE 108 (H) 03/02/2014 0835   BUN 15 12/29/2018 1351   BUN 9 03/02/2014 0835   CREATININE 0.79 12/29/2018 1351   CREATININE 0.99 03/02/2014 0835   CALCIUM 8.7 (L) 12/29/2018 1351   CALCIUM 8.6 03/02/2014 0835   PROT 7.0 12/29/2018 1351   PROT 7.0 03/02/2014 0835   ALBUMIN 4.0 12/29/2018 1351   ALBUMIN 3.7 03/02/2014 0835   AST 21 12/29/2018 1351   AST 20 03/02/2014 0835   ALT 21 12/29/2018 1351   ALT 24 03/02/2014 0835   ALKPHOS 112  12/29/2018 1351   ALKPHOS 96 03/02/2014 0835   BILITOT 0.7 12/29/2018 1351   BILITOT 0.6 03/02/2014 0835   GFRNONAA >60 12/29/2018 1351   GFRNONAA >60 03/02/2014 0835   GFRAA >60 12/29/2018 1351   GFRAA >60 03/02/2014 0835    No results found for: SPEP, UPEP  Lab Results  Component Value Date   WBC 18.4 (H) 12/29/2018   NEUTROABS 16.2 (H) 12/29/2018   HGB 14.2 12/29/2018   HCT 47.1 12/29/2018   MCV 89.2 12/29/2018   PLT 450 (H) 12/29/2018      Chemistry      Component Value Date/Time   NA 140 12/29/2018 1351   NA 140 03/02/2014 0835   K 4.1 12/29/2018 1351   K 3.8 03/02/2014 0835   CL 106 12/29/2018 1351   CL 109 (H) 03/02/2014 0835   CO2 26 12/29/2018 1351   CO2 26 03/02/2014 0835   BUN 15 12/29/2018 1351   BUN 9 03/02/2014 0835   CREATININE 0.79 12/29/2018 1351   CREATININE 0.99 03/02/2014 0835      Component Value Date/Time   CALCIUM 8.7 (L) 12/29/2018 1351   CALCIUM 8.6 03/02/2014 0835   ALKPHOS 112 12/29/2018 1351   ALKPHOS 96 03/02/2014 0835   AST 21 12/29/2018 1351   AST 20 03/02/2014 0835   ALT 21 12/29/2018 1351   ALT 24 03/02/2014 0835   BILITOT 0.7 12/29/2018 1351   BILITOT 0.6 03/02/2014 0835       RADIOGRAPHIC STUDIES: I have personally reviewed the  radiological images as listed and agreed with the findings in the report. No results found.   ASSESSMENT & PLAN:  Polycythemia vera ([place]) # Polycythemia vera Jak 2+; on Hydrea 500 mg twice a day; ultrasound January 2020-improvement of the volume to 1100 cc [previously 1500 cc].  LDH improving; mildly elevated white count at 18,000.  Overall stable.   #Continue Hydrea-500 mg twice a day; tolerating well except for skin rash [see discussion below].  Hematocrit is 47 proceed with phlebotomy today.  #Skin rash groin perineum/right behind the knee-9 to 10 months non-itchy; question Hydrea versus others.  We will make a referral to Dr. Evorn Gong again.  I left a message myself.  # "Stroke"- left eye- nonarteritic ischemic optic neuropathy.  Unrelated to polycythemia vera -stable.  # Bil Knee pain s/p steroid injection [Dr.Miller]; awaiting uric acid levels.   # DISPOSITION:  # Phlebotomy today # Referral to Dr.Dasher/or his partners dermatology re: skin rash.  # Follow up in 3 months-MD/labs- cbc/cmp/ldh- possible phlebotomy-Dr.B    Orders Placed This Encounter  Procedures  . Uric acid    Standing Status:   Future    Standing Expiration Date:   12/30/2019  . CBC with Differential/Platelet    Standing Status:   Future    Standing Expiration Date:   12/30/2019  . Comprehensive metabolic panel    Standing Status:   Future    Standing Expiration Date:   12/30/2019  . Lactate dehydrogenase    Standing Status:   Future    Standing Expiration Date:   12/30/2019  . Ambulatory referral to Dermatology    Referral Priority:   Routine    Referral Type:   Consultation    Referral Reason:   Specialty Services Required    Referred to Provider:   Dasher, Rayvon Char, MD    Requested Specialty:   Dermatology    Number of Visits Requested:   1   All questions were answered. The  patient knows to call the clinic with any problems, questions or concerns.      Cammie Sickle, MD 12/29/2018 3:03 PM

## 2018-12-29 NOTE — Progress Notes (Signed)
Orders clarified with Dr. Rogue Bussing. Therapeutic phlebotomy performed per MD order starting at 1544 and ending at 1552 using a 20 gauge PIV in rt AC removing 350cc. Snack and drink provided. Pt tolerated procedure well. Pt and VS stable at discharge.

## 2018-12-30 ENCOUNTER — Telehealth: Payer: Self-pay | Admitting: Internal Medicine

## 2018-12-30 NOTE — Telephone Encounter (Signed)
Heather/Brooke - please inform patient that uric acid is normal; otherwise follow-up as planned no new recommendations at this time.

## 2018-12-30 NOTE — Telephone Encounter (Signed)
Patient notified of these lab results and recommendations via voicemail. I advised patient to contact office with any questions.

## 2019-02-06 ENCOUNTER — Other Ambulatory Visit: Payer: Self-pay | Admitting: Internal Medicine

## 2019-02-06 DIAGNOSIS — D45 Polycythemia vera: Secondary | ICD-10-CM

## 2019-02-06 NOTE — Telephone Encounter (Signed)
)   Ref Range & Units 4mo ago (12/29/18) 3mo ago (11/27/18) 43mo ago (10/27/18) 28mo ago (09/08/18) 62mo ago (07/14/18) 22mo ago (05/07/18) 63mo ago (04/28/18)  WBC 4.0 - 10.5 K/uL 18.4High   14.0High   9.4  16.2High  R 17.3High  R 15.9High  R 17.3High  R  RBC 4.22 - 5.81 MIL/uL 5.28  5.05  5.38  5.94High  R 6.20High  R 6.02High  R 5.76 R  Hemoglobin 13.0 - 17.0 g/dL 14.2  13.1  12.6Low   13.4 R 13.7 R, CM 13.6 R 12.8Low  R  HCT 39.0 - 52.0 % 47.1  43.9  43.4  42.9 R 44.7 R, CM 42.9 R 41.4 R  MCV 80.0 - 100.0 fL 89.2  86.9  80.7  72.1Low   72.0Low   71.2Low   71.9Low    MCH 26.0 - 34.0 pg 26.9  25.9Low   23.4Low   22.5Low   22.1Low   22.6Low   22.2Low    MCHC 30.0 - 36.0 g/dL 30.1  29.8Low   29.0Low   31.2Low  R 30.6Low  R 31.7Low  R 30.9Low  R  RDW 11.5 - 15.5 % 19.5High   Not Measured  24.1High   17.6High  R 18.4High  R 17.2High  R 17.5High  R  Platelets 150 - 400 K/uL 450High   359  460High   379 R 529High  R 341 R, CM 421 R  nRBC 0.0 - 0.2 % 0.0  0.0  0.0       Neutrophils Relative % % 87  88  84  86  86   86   Neutro Abs 1.7 - 7.7 K/uL 16.2High   12.4High   7.9High   13.9High  R 14.8High  R  14.7High  R  Lymphocytes Relative % 6  6  10  7  7   8    Lymphs Abs 0.7 - 4.0 K/uL 1.0  0.8  0.9  1.1 R 1.2 R  1.3 R  Monocytes Relative % 3  2  3  2  2   2    Monocytes Absolute 0.1 - 1.0 K/uL 0.5  0.3  0.2  0.4 R 0.4 R  0.4 R  Eosinophils Relative % 2  2  1  4  4   3    Eosinophils Absolute 0.0 - 0.5 K/uL 0.3  0.2  0.1  0.6 R 0.6 R  0.6 R  Basophils Relative % 1  1  1  1  1   1    Basophils Absolute 0.0 - 0.1 K/uL 0.2High   0.1  0.1  0.2High  R, CM 0.2High  R, CM  0.2High  R, CM  Immature Granulocytes % 1  1  1        Abs Immature Granulocytes 0.00 - 0.07 K/uL 0.26High   0.09High  CM 0.09High        Comment: Performed at [facility], [place], [place] [address]  RBC Morphology   MIXED RBC POPULATION  Mixed RBC population VC      WBC Morphology    DIFF CONFIRMED BY MANUAL COUNT        Smear Review    PLATELETS APPEAR ADEQUATE       Hypersegmented Neutrophils    PRESENT       Polychromasia    PRESENT       Platelet Morphology    PLATELETS VARY IN SIZE WITH LARGE BODY PLATELETS NOTED CM  Resulting Agency  [facility] [place] [facility] [place] [facility] [place] [facility] [place] [facility] [place] [facility] [place] [facility]      Specimen Collected: 12/29/18 13:51  Last Resulted: 12/29/18 14:02     Lab Flowsheet    Order Details    View Encounter    Lab and Collection Details    Routing    Result History      VC=Value has a corrected status CM=Additional commentsR=Reference range differs from displayed range      Other Results from 12/29/2018   Uric acid  Order: [id number]   Status:  Final result  Visible to patient:  No (Not Released)  Next appt:  03/30/2019 at 01:00 PM in Oncology ([facility])  Dx:  Polycythemia vera ([place])   Ref Range & Units 26mo ago  Uric Acid, Serum 3.7 - 8.6 mg/dL 6.9   Comment: Performed at [facility], [address] [place], [place] [address]  Resulting Agency  [facility]      Specimen Collected: 12/29/18 13:58  Last Resulted: 12/29/18 15:34     Lab Flowsheet    Order Details    View Encounter    Lab and Collection Details    Routing    Result History            Lactate dehydrogenase  Order: [id number]   Status:  Final result  Visible to patient:  No (Not Released)  Next appt:  03/30/2019 at 01:00 PM in Oncology ([facility])  Dx:  Polycythemia vera ([place])   Ref Range & Units 1mo ago 5mo ago 43mo ago 74mo ago 68mo ago 29mo ago 89yr ago  LDH 98 - 192 U/L 191  213High  CM 196High  CM 221High  CM 218High  CM 215High  CM 191   Comment: Performed at [facility], [address] [place], [place] [address]  Resulting Agency  [facility] [place] [facility] [place] [facility] [place] [facility] [place] [facility] [place] [facility] [place] [facility]      Specimen Collected: 12/29/18 13:51  Last Resulted: 12/29/18 14:14     Lab Flowsheet    Order Details     View Encounter    Lab and Collection Details    Routing    Result History      CM=Additional comments        Contains abnormal data Comprehensive metabolic panel  Order: [id number]   Status:  Final result  Visible to patient:  No (Not Released)  Next appt:  03/30/2019 at 01:00 PM in Oncology ([facility])  Dx:  Polycythemia vera ([place])   Ref Range & Units 40mo ago 41mo ago 32mo ago 76mo ago 72mo ago 33mo ago 37yr ago  Sodium 135 - 145 mmol/L 140  138  139  140  138  139  138   Potassium 3.5 - 5.1 mmol/L 4.1  4.0  4.0  4.1  4.5  4.2  4.2   Chloride 98 - 111 mmol/L 106  108  105  110  105  106 R 106 R  CO2 22 - 32 mmol/L 26  26  26  23  24  24  27    Glucose, Bld 70 - 99 mg/dL 114High   85  100High   98  103High   114High  R 99 R  BUN 8 - 23 mg/dL 15  15  11  15  11  10  R 10 R  Creatinine, Ser 0.61 - 1.24 mg/dL 0.79  0.87  0.95  0.80  1.07  0.99  0.91   Calcium 8.9 - 10.3 mg/dL 8.7Low   9.1  8.9  8.8Low   8.8Low   8.7Low   9.1   Total Protein 6.5 - 8.1 g/dL 7.0  7.0  6.9    6.7  7.4   Albumin 3.5 - 5.0 g/dL 4.0  4.1  4.1    4.0  4.1   AST 15 - 41 U/L 21  25  26    26  29    ALT 0 - 44 U/L 21  16  20    18  R 19 R  Alkaline Phosphatase 38 - 126 U/L 112  99  82    112  98   Total Bilirubin 0.3 - 1.2 mg/dL 0.7  0.8  0.7    0.8  1.0   GFR calc non Af Amer >60 mL/min >60  >60  >60  >60  >60  >60  >60   GFR calc Af Amer >60 mL/min >60  >60  >60 CM >60 CM >60 CM >60 CM >60 CM  Anion gap 5 - 15 8  4Low  CM 8 CM 7 CM 9 CM 9 CM 5   Comment: Performed at [facility], [address] [place], [place] [address]  Resulting Agency  [facility] [place] [facility] [place] [facility] [place] [facility] [place] [facility] [place] [facility] [place] [facility]      Specimen Collected: 12/29/18 13:51  Last Resulted: 12/29/18 14:14

## 2019-03-06 ENCOUNTER — Telehealth: Payer: Self-pay | Admitting: *Deleted

## 2019-03-06 ENCOUNTER — Other Ambulatory Visit: Payer: Self-pay

## 2019-03-06 DIAGNOSIS — D751 Secondary polycythemia: Secondary | ICD-10-CM

## 2019-03-06 NOTE — Telephone Encounter (Signed)
Patient called scheduling team to arrange for a lab only apt on Monday. He is experiencing some headaches and would like to have a lab draw to see if he needs a phlebotomy.  I spoke with Dr. Rogue Bussing. V/o to have a cbc drawn to see if a phlebotomy is needed. Pt will need to wait on results.  I returned the patient's phone call. Left a detailed msg regarding the above plan of care.  If patient needs a phlebotomy, I can personally perform the phlebotomy in the clinic.  Left apt info for 1 pm on Monday 3/23 for patient.

## 2019-03-08 ENCOUNTER — Other Ambulatory Visit: Payer: Self-pay

## 2019-03-09 ENCOUNTER — Inpatient Hospital Stay: Payer: 59

## 2019-03-09 ENCOUNTER — Inpatient Hospital Stay: Payer: 59 | Attending: Internal Medicine

## 2019-03-09 ENCOUNTER — Other Ambulatory Visit: Payer: Self-pay

## 2019-03-09 DIAGNOSIS — D751 Secondary polycythemia: Secondary | ICD-10-CM

## 2019-03-09 DIAGNOSIS — D45 Polycythemia vera: Secondary | ICD-10-CM | POA: Insufficient documentation

## 2019-03-09 LAB — CBC WITH DIFFERENTIAL/PLATELET
Abs Immature Granulocytes: 0.12 10*3/uL — ABNORMAL HIGH (ref 0.00–0.07)
BASOS PCT: 1 %
Basophils Absolute: 0.2 10*3/uL — ABNORMAL HIGH (ref 0.0–0.1)
Eosinophils Absolute: 0.2 10*3/uL (ref 0.0–0.5)
Eosinophils Relative: 2 %
HCT: 46.7 % (ref 39.0–52.0)
Hemoglobin: 14 g/dL (ref 13.0–17.0)
Immature Granulocytes: 1 %
Lymphocytes Relative: 7 %
Lymphs Abs: 1 10*3/uL (ref 0.7–4.0)
MCH: 26.7 pg (ref 26.0–34.0)
MCHC: 30 g/dL (ref 30.0–36.0)
MCV: 89.1 fL (ref 80.0–100.0)
MONOS PCT: 2 %
Monocytes Absolute: 0.3 10*3/uL (ref 0.1–1.0)
Neutro Abs: 12.8 10*3/uL — ABNORMAL HIGH (ref 1.7–7.7)
Neutrophils Relative %: 87 %
Platelets: 316 10*3/uL (ref 150–400)
RBC: 5.24 MIL/uL (ref 4.22–5.81)
RDW: 17.1 % — ABNORMAL HIGH (ref 11.5–15.5)
WBC: 14.6 10*3/uL — ABNORMAL HIGH (ref 4.0–10.5)
nRBC: 0 % (ref 0.0–0.2)

## 2019-03-09 NOTE — Progress Notes (Signed)
Per v/o dr. Rogue Bussing moved apts in April to the end of May.  I also assisted the patient to set up mychart

## 2019-03-30 ENCOUNTER — Other Ambulatory Visit: Payer: Self-pay | Admitting: Internal Medicine

## 2019-03-30 ENCOUNTER — Inpatient Hospital Stay: Payer: 59 | Attending: Internal Medicine

## 2019-03-30 ENCOUNTER — Inpatient Hospital Stay: Payer: 59

## 2019-03-30 ENCOUNTER — Other Ambulatory Visit: Payer: Self-pay

## 2019-03-30 ENCOUNTER — Ambulatory Visit: Payer: 59 | Admitting: Internal Medicine

## 2019-03-30 ENCOUNTER — Telehealth: Payer: Self-pay | Admitting: *Deleted

## 2019-03-30 ENCOUNTER — Other Ambulatory Visit: Payer: 59

## 2019-03-30 DIAGNOSIS — D751 Secondary polycythemia: Secondary | ICD-10-CM

## 2019-03-30 DIAGNOSIS — D45 Polycythemia vera: Secondary | ICD-10-CM | POA: Insufficient documentation

## 2019-03-30 LAB — CBC WITH DIFFERENTIAL/PLATELET
Abs Immature Granulocytes: 0.24 10*3/uL — ABNORMAL HIGH (ref 0.00–0.07)
Basophils Absolute: 0.2 10*3/uL — ABNORMAL HIGH (ref 0.0–0.1)
Basophils Relative: 1 %
Eosinophils Absolute: 0.2 10*3/uL (ref 0.0–0.5)
Eosinophils Relative: 1 %
HCT: 44.5 % (ref 39.0–52.0)
Hemoglobin: 13.4 g/dL (ref 13.0–17.0)
Immature Granulocytes: 2 %
Lymphocytes Relative: 8 %
Lymphs Abs: 1.2 10*3/uL (ref 0.7–4.0)
MCH: 26.8 pg (ref 26.0–34.0)
MCHC: 30.1 g/dL (ref 30.0–36.0)
MCV: 89 fL (ref 80.0–100.0)
Monocytes Absolute: 0.3 10*3/uL (ref 0.1–1.0)
Monocytes Relative: 2 %
Neutro Abs: 12.6 10*3/uL — ABNORMAL HIGH (ref 1.7–7.7)
Neutrophils Relative %: 86 %
Platelets: 398 10*3/uL (ref 150–400)
RBC: 5 MIL/uL (ref 4.22–5.81)
RDW: 17.1 % — ABNORMAL HIGH (ref 11.5–15.5)
WBC: 14.6 10*3/uL — ABNORMAL HIGH (ref 4.0–10.5)
nRBC: 0.1 % (ref 0.0–0.2)

## 2019-03-30 LAB — COMPREHENSIVE METABOLIC PANEL
ALT: 19 U/L (ref 0–44)
AST: 23 U/L (ref 15–41)
Albumin: 4.1 g/dL (ref 3.5–5.0)
Alkaline Phosphatase: 97 U/L (ref 38–126)
Anion gap: 8 (ref 5–15)
BUN: 13 mg/dL (ref 8–23)
CO2: 23 mmol/L (ref 22–32)
Calcium: 8.8 mg/dL — ABNORMAL LOW (ref 8.9–10.3)
Chloride: 108 mmol/L (ref 98–111)
Creatinine, Ser: 1.04 mg/dL (ref 0.61–1.24)
GFR calc Af Amer: >60 mL/min (ref >60)
GFR calc non Af Amer: >60 mL/min (ref >60)
Glucose, Bld: 127 mg/dL — ABNORMAL HIGH (ref 70–99)
Potassium: 3.6 mmol/L (ref 3.5–5.1)
Sodium: 139 mmol/L (ref 135–145)
Total Bilirubin: 0.8 mg/dL (ref 0.3–1.2)
Total Protein: 7 g/dL (ref 6.5–8.1)

## 2019-03-30 LAB — LACTATE DEHYDROGENASE: LDH: 219 U/L — ABNORMAL HIGH (ref 98–192)

## 2019-03-30 LAB — URIC ACID: Uric Acid, Serum: 8.7 mg/dL — ABNORMAL HIGH (ref 3.7–8.6)

## 2019-03-30 NOTE — Progress Notes (Signed)
Increase hydrea.  Continue Hydrea 500 mg BID; Take additional 500 mg M/W/F.   Recheck labs- H&H in 3 weeks.   GB

## 2019-03-30 NOTE — Patient Instructions (Signed)
Increase hydrea.  Continue Hydrea 500 mg twice daily; Take additional 500 mg tablet on M/W/F.   Recheck labs- H&H in 3 weeks.

## 2019-03-30 NOTE — Telephone Encounter (Signed)
Per Dr. Kavin Leech. To come in today for lab only. Please have patient wait on results as he may require a phlebotomy. If infusion is not able to accommodate the patient on their schedule today, I will be willing to take this assignment if phlebotomy is needed.

## 2019-03-30 NOTE — Telephone Encounter (Signed)
Patient called asking to come in and have labs checked. Please advise

## 2019-03-30 NOTE — Addendum Note (Signed)
Addended by: Sabino Gasser on: 03/30/2019 02:43 PM   Modules accepted: Orders

## 2019-03-30 NOTE — Telephone Encounter (Signed)
Patient agrees to come in at 130PM today and K Hinson, RN in inf has agreed to have patient come to infusion for phlebotomy

## 2019-03-30 NOTE — Progress Notes (Signed)
Patient provided with these instructions -written on AVS and new appointments provided for 04/20/2019

## 2019-04-14 ENCOUNTER — Telehealth: Payer: Self-pay | Admitting: *Deleted

## 2019-04-14 NOTE — Telephone Encounter (Signed)
Patient left vm on triage line.  Patient request appt to be changed from 04/20/19 to 04/17/19.  Patient stated this is second time calling.

## 2019-04-17 ENCOUNTER — Inpatient Hospital Stay: Payer: 59 | Attending: Internal Medicine

## 2019-04-17 ENCOUNTER — Inpatient Hospital Stay: Payer: 59

## 2019-04-17 ENCOUNTER — Telehealth: Payer: Self-pay | Admitting: Internal Medicine

## 2019-04-17 ENCOUNTER — Other Ambulatory Visit: Payer: Self-pay

## 2019-04-17 DIAGNOSIS — M25562 Pain in left knee: Secondary | ICD-10-CM | POA: Diagnosis not present

## 2019-04-17 DIAGNOSIS — H47012 Ischemic optic neuropathy, left eye: Secondary | ICD-10-CM | POA: Insufficient documentation

## 2019-04-17 DIAGNOSIS — R21 Rash and other nonspecific skin eruption: Secondary | ICD-10-CM | POA: Insufficient documentation

## 2019-04-17 DIAGNOSIS — D45 Polycythemia vera: Secondary | ICD-10-CM | POA: Diagnosis not present

## 2019-04-17 DIAGNOSIS — M25561 Pain in right knee: Secondary | ICD-10-CM | POA: Insufficient documentation

## 2019-04-17 LAB — CBC WITH DIFFERENTIAL/PLATELET
Abs Immature Granulocytes: 0.1 10*3/uL — ABNORMAL HIGH (ref 0.00–0.07)
Basophils Absolute: 0.1 10*3/uL (ref 0.0–0.1)
Basophils Relative: 1 %
Eosinophils Absolute: 0.1 10*3/uL (ref 0.0–0.5)
Eosinophils Relative: 1 %
HCT: 41.1 % (ref 39.0–52.0)
Hemoglobin: 12.5 g/dL — ABNORMAL LOW (ref 13.0–17.0)
Immature Granulocytes: 1 %
Lymphocytes Relative: 8 %
Lymphs Abs: 1 10*3/uL (ref 0.7–4.0)
MCH: 28.3 pg (ref 26.0–34.0)
MCHC: 30.4 g/dL (ref 30.0–36.0)
MCV: 93.2 fL (ref 80.0–100.0)
Monocytes Absolute: 0.2 10*3/uL (ref 0.1–1.0)
Monocytes Relative: 2 %
Neutro Abs: 10.2 10*3/uL — ABNORMAL HIGH (ref 1.7–7.7)
Neutrophils Relative %: 87 %
Platelets: 87 10*3/uL — ABNORMAL LOW (ref 150–400)
RBC: 4.41 MIL/uL (ref 4.22–5.81)
RDW: 18.9 % — ABNORMAL HIGH (ref 11.5–15.5)
WBC: 11.8 10*3/uL — ABNORMAL HIGH (ref 4.0–10.5)
nRBC: 0 % (ref 0.0–0.2)

## 2019-04-17 NOTE — Progress Notes (Signed)
MD made aware of pt.'s hct-41.1 and pt does not have any headaches at this time. Verbal order that pt does not need phlebotomy at this time. Pt updated and all questions answered.   Domonique Brouillard [employer]

## 2019-04-17 NOTE — Telephone Encounter (Signed)
Platelets -87; hct 41; asymptomatic. Recommend hydrea 500 mg BID; Fri/dat- on extra 500 mg.    Colette- please change to Virtual MD; keep labs/phlebotomy- on 5/29 as planned.

## 2019-04-20 ENCOUNTER — Other Ambulatory Visit: Payer: 59

## 2019-04-20 ENCOUNTER — Telehealth: Payer: Self-pay | Admitting: *Deleted

## 2019-04-20 NOTE — Telephone Encounter (Signed)
[name]- please inform pt that its okay to work form hematology stand point with his current blood counts.  GB

## 2019-04-20 NOTE — Telephone Encounter (Signed)
Patient informed of doctor response 

## 2019-04-20 NOTE — Telephone Encounter (Signed)
Patient called asking if Dr B thinks he would be ok to work with his platelet count of 80, His boss told him he has 2 weeks of PTO if he needs to be off, but wants to work if [place] with doctor . Please advise

## 2019-05-08 ENCOUNTER — Other Ambulatory Visit: Payer: Self-pay | Admitting: Internal Medicine

## 2019-05-08 DIAGNOSIS — D45 Polycythemia vera: Secondary | ICD-10-CM

## 2019-05-08 MED ORDER — HYDROXYUREA 500 MG PO CAPS: ORAL_CAPSULE | ORAL | 3 refills | Status: DC

## 2019-05-14 ENCOUNTER — Telehealth: Payer: Self-pay | Admitting: Internal Medicine

## 2019-05-15 ENCOUNTER — Inpatient Hospital Stay: Payer: 59

## 2019-05-15 ENCOUNTER — Inpatient Hospital Stay (HOSPITAL_BASED_OUTPATIENT_CLINIC_OR_DEPARTMENT_OTHER): Payer: 59 | Admitting: Internal Medicine

## 2019-05-15 ENCOUNTER — Other Ambulatory Visit: Payer: Self-pay

## 2019-05-15 ENCOUNTER — Encounter: Payer: Self-pay | Admitting: Internal Medicine

## 2019-05-15 DIAGNOSIS — D751 Secondary polycythemia: Secondary | ICD-10-CM

## 2019-05-15 DIAGNOSIS — D45 Polycythemia vera: Secondary | ICD-10-CM

## 2019-05-15 DIAGNOSIS — Z79899 Other long term (current) drug therapy: Secondary | ICD-10-CM | POA: Diagnosis not present

## 2019-05-15 LAB — HEMOGLOBIN: Hemoglobin: 11.8 g/dL — ABNORMAL LOW (ref 13.0–17.0)

## 2019-05-15 LAB — HEMATOCRIT: HCT: 38.6 % — ABNORMAL LOW (ref 39.0–52.0)

## 2019-05-15 LAB — PLATELET COUNT: Platelets: 302 10*3/uL (ref 150–400)

## 2019-05-15 NOTE — Progress Notes (Signed)
I connected with [name] on 05/15/2019 at  2:30 PM EDT by telephone visit and verified that I am speaking with the correct person using two identifiers.  I discussed the limitations, risks, security and privacy concerns of performing an evaluation and management service by telemedicine and the availability of in-person appointments. I also discussed with the patient that there may be a patient responsible charge related to this service. The patient expressed understanding and agreed to proceed.    Other persons participating in the visit and their role in the encounter: RN/medical reconciliation Patient's location: Home Provider's location: Home  Oncology History   # 2014- POLYCYTHEMIA VERA - JAK-2 Positive; on Hydrea 500mg /day; aspirin 81 mg a day; phlebotomy for Hct >43 [headaches];   # MAY 2019- BMBx- pan-hypercellular; no evidence of significant fibrosis; MAY [place]- 1389 cc/ splenomegaly; Oct 11th 2019- Increase in splenomegaly; Increased Hydrea 1500mg /day.   # nonarteritic ischemic optic neuropathy-Left eye [may 2018; Dr.Dingledein/Dr.Sitko at [name]; Previous Right eye ---------------------------------------    DIAGNOSIS: [ ]  POLYCYTHEMIA VERA  ;GOALS: control  CURRENT/MOST RECENT THERAPY [ ]  Hydrea          Polycythemia vera ([name])     Chief Complaint: Polycythemia vera   History of present illness:De[name] 63 y.o.  male with history of polycythemia vera is here for follow-up.  Patient denies any headaches denies any nausea vomiting.  Denies any loss of appetite.  Patient is  Observation/objective:  Assessment and plan: Polycythemia vera ([name]) # Polycythemia vera Jak 2+; on Hydrea 500 mg twice a day; except Saturday Sunday-extra pill hemoglobin is 11.8 hematocrit 38; platelet-302.  #Skin rash groin perineum- dermatophytoses- cleared.   # "Stroke"- left eye- nonarteritic ischemic optic neuropathy.  Unrelated to polycythemia vera -stable  # Bil Knee pain s/p  steroid injection [Dr.Miller]; improved.   #Disposition #In 1 month CBC/BMP LDH/possible phlebotomy #Follow-up-2 months/MD-CBC/CMP LDH; possible phlebotomy- [name]  Follow-up instructions:  I discussed the assessment and treatment plan with the patient.  The patient was provided an opportunity to ask questions and all were answered.  The patient agreed with the plan and demonstrated understanding of instructions.  The patient was advised to call back or seek an in person evaluation if the symptoms worsen or if the condition fails to improve as anticipated.  I provided 12 minutes of non face-to-face telephone visit time during this encounter, and > 50% was spent counseling as documented under my assessment & plan.   Dr. Charlaine Dalton [name] at [facility] 05/27/2019 9:58 AM

## 2019-05-15 NOTE — Assessment & Plan Note (Addendum)
#   Polycythemia vera Jak 2+; on Hydrea 500 mg twice a day; except Saturday Sunday-extra pill hemoglobin is 11.8 hematocrit 38; platelet-302.  #Skin rash groin perineum- dermatophytoses- cleared.   # "Stroke"- left eye- nonarteritic ischemic optic neuropathy.  Unrelated to polycythemia vera -stable  # Bil Knee pain s/p steroid injection [Dr.Miller]; improved.   #Disposition #In 1 month CBC/BMP LDH/possible phlebotomy #Follow-up-2 months/MD-CBC/CMP LDH; possible phlebotomy- Dr.B

## 2019-06-11 ENCOUNTER — Other Ambulatory Visit: Payer: Self-pay

## 2019-06-12 ENCOUNTER — Other Ambulatory Visit: Payer: Self-pay

## 2019-06-12 ENCOUNTER — Inpatient Hospital Stay: Payer: 59

## 2019-06-12 ENCOUNTER — Inpatient Hospital Stay: Payer: 59 | Attending: Internal Medicine

## 2019-06-12 DIAGNOSIS — D45 Polycythemia vera: Secondary | ICD-10-CM

## 2019-06-12 LAB — CBC WITH DIFFERENTIAL/PLATELET
Abs Immature Granulocytes: 0.16 10*3/uL — ABNORMAL HIGH (ref 0.00–0.07)
Basophils Absolute: 0.1 10*3/uL (ref 0.0–0.1)
Basophils Relative: 1 %
Eosinophils Absolute: 0.1 10*3/uL (ref 0.0–0.5)
Eosinophils Relative: 1 %
HCT: 40.5 % (ref 39.0–52.0)
Hemoglobin: 12.4 g/dL — ABNORMAL LOW (ref 13.0–17.0)
Immature Granulocytes: 1 %
Lymphocytes Relative: 7 %
Lymphs Abs: 0.9 10*3/uL (ref 0.7–4.0)
MCH: 29 pg (ref 26.0–34.0)
MCHC: 30.6 g/dL (ref 30.0–36.0)
MCV: 94.8 fL (ref 80.0–100.0)
Monocytes Absolute: 0.3 10*3/uL (ref 0.1–1.0)
Monocytes Relative: 2 %
Neutro Abs: 11.9 10*3/uL — ABNORMAL HIGH (ref 1.7–7.7)
Neutrophils Relative %: 88 %
Platelets: 290 10*3/uL (ref 150–400)
RBC: 4.27 MIL/uL (ref 4.22–5.81)
RDW: 17 % — ABNORMAL HIGH (ref 11.5–15.5)
WBC: 13.4 10*3/uL — ABNORMAL HIGH (ref 4.0–10.5)
nRBC: 0 % (ref 0.0–0.2)

## 2019-06-12 LAB — COMPREHENSIVE METABOLIC PANEL
ALT: 17 U/L (ref 0–44)
AST: 23 U/L (ref 15–41)
Albumin: 4.2 g/dL (ref 3.5–5.0)
Alkaline Phosphatase: 99 U/L (ref 38–126)
Anion gap: 8 (ref 5–15)
BUN: 10 mg/dL (ref 8–23)
CO2: 25 mmol/L (ref 22–32)
Calcium: 8.7 mg/dL — ABNORMAL LOW (ref 8.9–10.3)
Chloride: 107 mmol/L (ref 98–111)
Creatinine, Ser: 0.98 mg/dL (ref 0.61–1.24)
GFR calc Af Amer: >60 mL/min (ref >60)
GFR calc non Af Amer: >60 mL/min (ref >60)
Glucose, Bld: 131 mg/dL — ABNORMAL HIGH (ref 70–99)
Potassium: 4.2 mmol/L (ref 3.5–5.1)
Sodium: 140 mmol/L (ref 135–145)
Total Bilirubin: 0.9 mg/dL (ref 0.3–1.2)
Total Protein: 6.7 g/dL (ref 6.5–8.1)

## 2019-06-12 LAB — LACTATE DEHYDROGENASE: LDH: 226 U/L — ABNORMAL HIGH (ref 98–192)

## 2019-06-12 NOTE — Progress Notes (Signed)
HCT 40.5. pt denies any concerns or headaches. Per Dr. Rogue Bussing no phlebotomy needed at this time. Pt aware and verbalizes understanding. Pt stable at discharge.

## 2019-07-14 ENCOUNTER — Other Ambulatory Visit: Payer: Self-pay

## 2019-07-15 ENCOUNTER — Inpatient Hospital Stay (HOSPITAL_BASED_OUTPATIENT_CLINIC_OR_DEPARTMENT_OTHER): Payer: 59 | Admitting: Internal Medicine

## 2019-07-15 ENCOUNTER — Other Ambulatory Visit: Payer: Self-pay

## 2019-07-15 ENCOUNTER — Inpatient Hospital Stay: Payer: 59

## 2019-07-15 ENCOUNTER — Inpatient Hospital Stay: Payer: 59 | Attending: Internal Medicine

## 2019-07-15 DIAGNOSIS — D45 Polycythemia vera: Secondary | ICD-10-CM

## 2019-07-15 DIAGNOSIS — H47012 Ischemic optic neuropathy, left eye: Secondary | ICD-10-CM | POA: Insufficient documentation

## 2019-07-15 DIAGNOSIS — Z79899 Other long term (current) drug therapy: Secondary | ICD-10-CM

## 2019-07-15 LAB — COMPREHENSIVE METABOLIC PANEL
ALT: 16 U/L (ref 0–44)
AST: 20 U/L (ref 15–41)
Albumin: 4.2 g/dL (ref 3.5–5.0)
Alkaline Phosphatase: 84 U/L (ref 38–126)
Anion gap: 2 — ABNORMAL LOW (ref 5–15)
BUN: 11 mg/dL (ref 8–23)
CO2: 25 mmol/L (ref 22–32)
Calcium: 8.4 mg/dL — ABNORMAL LOW (ref 8.9–10.3)
Chloride: 110 mmol/L (ref 98–111)
Creatinine, Ser: 0.9 mg/dL (ref 0.61–1.24)
GFR calc Af Amer: >60 mL/min (ref >60)
GFR calc non Af Amer: >60 mL/min (ref >60)
Glucose, Bld: 101 mg/dL — ABNORMAL HIGH (ref 70–99)
Potassium: 4.2 mmol/L (ref 3.5–5.1)
Sodium: 137 mmol/L (ref 135–145)
Total Bilirubin: 1 mg/dL (ref 0.3–1.2)
Total Protein: 7.1 g/dL (ref 6.5–8.1)

## 2019-07-15 LAB — CBC WITH DIFFERENTIAL/PLATELET
Abs Immature Granulocytes: 0.14 10*3/uL — ABNORMAL HIGH (ref 0.00–0.07)
Basophils Absolute: 0.1 10*3/uL (ref 0.0–0.1)
Basophils Relative: 1 %
Eosinophils Absolute: 0.1 10*3/uL (ref 0.0–0.5)
Eosinophils Relative: 1 %
HCT: 40.1 % (ref 39.0–52.0)
Hemoglobin: 12.3 g/dL — ABNORMAL LOW (ref 13.0–17.0)
Immature Granulocytes: 2 %
Lymphocytes Relative: 10 %
Lymphs Abs: 0.8 10*3/uL (ref 0.7–4.0)
MCH: 28.6 pg (ref 26.0–34.0)
MCHC: 30.7 g/dL (ref 30.0–36.0)
MCV: 93.3 fL (ref 80.0–100.0)
Monocytes Absolute: 0.3 10*3/uL (ref 0.1–1.0)
Monocytes Relative: 3 %
Neutro Abs: 7.2 10*3/uL (ref 1.7–7.7)
Neutrophils Relative %: 83 %
Platelets: 237 10*3/uL (ref 150–400)
RBC: 4.3 MIL/uL (ref 4.22–5.81)
RDW: 14.6 % (ref 11.5–15.5)
WBC: 8.6 10*3/uL (ref 4.0–10.5)
nRBC: 0 % (ref 0.0–0.2)

## 2019-07-15 LAB — LACTATE DEHYDROGENASE: LDH: 172 U/L (ref 98–192)

## 2019-07-15 NOTE — Assessment & Plan Note (Addendum)
#   Polycythemia vera Jak 2+; on Hydrea 500 mg twice a day; except Saturday Sunday-extra pill hemoglobin is  12.3 hematocrit 40.6 platelet-232.  Stable.  #No concerns for transformation/progression.  Continue current dose of Hydrea.  No phlebotomy today.  # "Stroke"- left eye- nonarteritic ischemic optic neuropathy.  Unrelated to polycythemia vera - stable.  Continue aspirin.  # Bil Knee pain s/p steroid injection [Dr.Miller]; improved.    #Disposition # NO phlebotomy today #In 2 month H&H//possible phlebotomy #Follow-up-4 months/MD-CBC/CMP LDH; possible phlebotomy- Dr.B

## 2019-07-15 NOTE — Progress Notes (Signed)
[place] OFFICE PROGRESS NOTE  Patient Care Team: Dion Body, MD as PCP - General (Family Medicine)  Cancer Staging No matching staging information was found for the patient.   Oncology History Overview Note  # 2014- POLYCYTHEMIA VERA - JAK-2 Positive; on Hydrea 500mg /day; aspirin 81 mg a day; phlebotomy for Hct >43 [headaches];   # MAY 2019- BMBx- pan-hypercellular; no evidence of significant fibrosis; MAY [place]- 1389 cc/ splenomegaly; Oct 11th 2019- Increase in splenomegaly; Increased Hydrea 1500mg /day.   # nonarteritic ischemic optic neuropathy-Left eye [may 2018; Dr.Dingledein/Dr.Sitko at [place]; Previous Right eye ---------------------------------------    DIAGNOSIS: [ ]  POLYCYTHEMIA VERA  ;GOALS: control  CURRENT/MOST RECENT THERAPY [ ]  Hydrea        Polycythemia vera (HCC)      INTERVAL HISTORY:  Derek [name] 63 y.o.  male pleasant patient above history of polycythemia vera is here for follow-up.   Patient denies any unusual fatigue.  Denies any weight loss.  Denies any nausea vomiting abdominal pain.  Denies any early satiety.  Denies any night sweats.   Review of Systems  Constitutional: Negative for chills, diaphoresis, fever, malaise/fatigue and weight loss.  HENT: Negative for nosebleeds and sore throat.   Eyes: Negative for double vision.  Respiratory: Negative for cough, hemoptysis, sputum production, shortness of breath and wheezing.   Cardiovascular: Negative for chest pain, palpitations, orthopnea and leg swelling.  Gastrointestinal: Negative for abdominal pain, blood in stool, constipation, diarrhea, heartburn, melena, nausea and vomiting.  Genitourinary: Negative for dysuria, frequency and urgency.  Musculoskeletal: Negative for back pain and joint pain.  Skin: Negative for itching.  Neurological: Negative for dizziness, tingling, focal weakness, weakness and headaches.  Endo/Heme/Allergies: Does not bruise/bleed easily.   Psychiatric/Behavioral: Negative for depression. The patient is not nervous/anxious and does not have insomnia.       PAST MEDICAL HISTORY :  Past Medical History:  Diagnosis Date  . Arthritis   . GERD (gastroesophageal reflux disease)   . Gout   . Low serum testosterone level   . Polycythemia vera ([place])   . Polycythemia, secondary 06/08/2015  . Pulmonary nodules   . Thoracic spine fracture (HCC)    compression fracture following a fall    PAST SURGICAL HISTORY :   Past Surgical History:  Procedure Laterality Date  . COLONOSCOPY  2013    FAMILY HISTORY :   Family History  Problem Relation Age of Onset  . Throat cancer Other        uncle  . Migraines Neg Hx   . Multiple sclerosis Neg Hx   . Neurofibromatosis Neg Hx   . Parkinsonism Neg Hx   . Seizures Neg Hx   . Neuropathy Neg Hx     SOCIAL HISTORY:   Social History   Tobacco Use  . Smoking status: Never Smoker  . Smokeless tobacco: Never Used  Substance Use Topics  . Alcohol use: No    Alcohol/week: 0.0 standard drinks    Comment: occasional alcohol use  . Drug use: No    ALLERGIES:  is allergic to penicillin g.  MEDICATIONS:  Current Outpatient Medications  Medication Sig Dispense Refill  . amLODipine (NORVASC) 5 MG tablet Take 5 mg daily by mouth.  11  . aspirin EC 81 MG tablet Take 81 mg daily by mouth.     . Cholecalciferol (VITAMIN D-1000 MAX ST) 1000 units tablet Take 1,000 Units daily by mouth.     . Esomeprazole Magnesium (NEXIUM PO) Take 1 capsule  by mouth daily.    . fluticasone (FLONASE) 50 MCG/ACT nasal spray     . hydroxyurea (HYDREA) 500 MG capsule Take one pill twice a da; Friday & Saturday- Take extra 500 mg pill. May take with food to minimize GI side effects. (Patient taking differently: Takes 1 tablet in the am and 1 tablet in the pm - Monday through Friday Takes 1 tablet in the am and tablet twice daily tablets on Saturday & Sunday-) 90 capsule 3  . indomethacin (INDOCIN) 50 MG  capsule Take 1 capsule by mouth as needed. GOUT  0  . Multiple Vitamins-Minerals (MULTIVITAMIN ADULT PO) Take 1 tablet by mouth 1 day or 1 dose.    . oxyCODONE-acetaminophen (PERCOCET/ROXICET) 5-325 MG tablet Take 1 tablet by mouth every 6 (six) hours as needed for pain. GOUT FLARE UP    . triamcinolone (KENALOG) 0.025 % cream Apply 1 application topically 2 (two) times daily. To affected area 80 g 1   No current facility-administered medications for this visit.     PHYSICAL EXAMINATION: ECOG PERFORMANCE STATUS: 0 - Asymptomatic  BP 132/78   Pulse 80   Temp (!) 97.4 F (36.3 C)   Resp 18   Wt 198 lb (89.8 kg)   BMI 29.24 kg/m   Filed Weights   07/15/19 1306  Weight: 198 lb (89.8 kg)    Physical Exam  Constitutional: He is oriented to person, place, and time and well-developed, well-nourished, and in no distress.  HENT:  Head: Normocephalic and atraumatic.  Mouth/Throat: Oropharynx is clear and moist. No oropharyngeal exudate.  Eyes: Pupils are equal, round, and reactive to light.  Neck: Normal range of motion. Neck supple.  Cardiovascular: Normal rate and regular rhythm.  Pulmonary/Chest: No respiratory distress. He has no wheezes.  Abdominal: Soft. Bowel sounds are normal. He exhibits no distension and no mass. There is no abdominal tenderness. There is no rebound and no guarding.  Positive for mild splenomegaly.  Musculoskeletal: Normal range of motion.        General: No tenderness or edema.  Neurological: He is alert and oriented to person, place, and time.  Skin: Skin is warm.  Psychiatric: Affect normal.       LABORATORY DATA:  I have reviewed the data as listed    Component Value Date/Time   NA 137 07/15/2019 1246   NA 140 03/02/2014 0835   K 4.2 07/15/2019 1246   K 3.8 03/02/2014 0835   CL 110 07/15/2019 1246   CL 109 (H) 03/02/2014 0835   CO2 25 07/15/2019 1246   CO2 26 03/02/2014 0835   GLUCOSE 101 (H) 07/15/2019 1246   GLUCOSE 108 (H) 03/02/2014  0835   BUN 11 07/15/2019 1246   BUN 9 03/02/2014 0835   CREATININE 0.90 07/15/2019 1246   CREATININE 0.99 03/02/2014 0835   CALCIUM 8.4 (L) 07/15/2019 1246   CALCIUM 8.6 03/02/2014 0835   PROT 7.1 07/15/2019 1246   PROT 7.0 03/02/2014 0835   ALBUMIN 4.2 07/15/2019 1246   ALBUMIN 3.7 03/02/2014 0835   AST 20 07/15/2019 1246   AST 20 03/02/2014 0835   ALT 16 07/15/2019 1246   ALT 24 03/02/2014 0835   ALKPHOS 84 07/15/2019 1246   ALKPHOS 96 03/02/2014 0835   BILITOT 1.0 07/15/2019 1246   BILITOT 0.6 03/02/2014 0835   GFRNONAA >60 07/15/2019 1246   GFRNONAA >60 03/02/2014 0835   GFRAA >60 07/15/2019 1246   GFRAA >60 03/02/2014 0835    No results found  for: SPEP, UPEP  Lab Results  Component Value Date   WBC 8.6 07/15/2019   NEUTROABS 7.2 07/15/2019   HGB 12.3 (L) 07/15/2019   HCT 40.1 07/15/2019   MCV 93.3 07/15/2019   PLT 237 07/15/2019      Chemistry      Component Value Date/Time   NA 137 07/15/2019 1246   NA 140 03/02/2014 0835   K 4.2 07/15/2019 1246   K 3.8 03/02/2014 0835   CL 110 07/15/2019 1246   CL 109 (H) 03/02/2014 0835   CO2 25 07/15/2019 1246   CO2 26 03/02/2014 0835   BUN 11 07/15/2019 1246   BUN 9 03/02/2014 0835   CREATININE 0.90 07/15/2019 1246   CREATININE 0.99 03/02/2014 0835      Component Value Date/Time   CALCIUM 8.4 (L) 07/15/2019 1246   CALCIUM 8.6 03/02/2014 0835   ALKPHOS 84 07/15/2019 1246   ALKPHOS 96 03/02/2014 0835   AST 20 07/15/2019 1246   AST 20 03/02/2014 0835   ALT 16 07/15/2019 1246   ALT 24 03/02/2014 0835   BILITOT 1.0 07/15/2019 1246   BILITOT 0.6 03/02/2014 0835       RADIOGRAPHIC STUDIES: I have personally reviewed the radiological images as listed and agreed with the findings in the report. No results found.   ASSESSMENT & PLAN:  Polycythemia vera ([place]) # Polycythemia vera Jak 2+; on Hydrea 500 mg twice a day; except Saturday Sunday-extra pill hemoglobin is  12.3 hematocrit 40.6 platelet-232.   Stable.  #No concerns for transformation/progression.  Continue current dose of Hydrea.  No phlebotomy today.  # "Stroke"- left eye- nonarteritic ischemic optic neuropathy.  Unrelated to polycythemia vera - stable.  Continue aspirin.  # Bil Knee pain s/p steroid injection [Dr.Miller]; improved.    #Disposition # NO phlebotomy today #In 2 month H&H//possible phlebotomy #Follow-up-4 months/MD-CBC/CMP LDH; possible phlebotomy- Dr.B   Orders Placed This Encounter  Procedures  . Hematocrit ([facility])    Standing Status:   Future    Standing Expiration Date:   07/14/2020  . Hemoglobin [facility])    Standing Status:   Future    Standing Expiration Date:   07/14/2020  . CBC with Differential    Standing Status:   Future    Standing Expiration Date:   07/14/2020  . Comprehensive metabolic panel    Standing Status:   Future    Standing Expiration Date:   07/14/2020  . Lactate dehydrogenase    Standing Status:   Future    Standing Expiration Date:   07/14/2020   All questions were answered. The patient knows to call the clinic with any problems, questions or concerns.      Cammie Sickle, MD 07/15/2019 2:42 PM

## 2019-07-15 NOTE — Progress Notes (Signed)
Patient does not offer any problems today.  

## 2019-08-04 ENCOUNTER — Other Ambulatory Visit: Payer: Self-pay | Admitting: Internal Medicine

## 2019-08-04 DIAGNOSIS — D45 Polycythemia vera: Secondary | ICD-10-CM

## 2019-08-05 MED ORDER — HYDROXYUREA 500 MG PO CAPS: ORAL_CAPSULE | ORAL | 1 refills | Status: DC

## 2019-08-05 NOTE — Telephone Encounter (Signed)
Script faxed to pharmacy

## 2019-08-05 NOTE — Addendum Note (Signed)
Addended by: Sabino Gasser on: 08/05/2019 08:51 AM   Modules accepted: Orders

## 2019-08-05 NOTE — Telephone Encounter (Signed)
CBC with Differential Order: [id number] Status:  Final result Visible to patient:  Yes (MyChart) Next appt:  09/16/2019 at 01:00 PM in Oncology ([facility]) Dx:  Polycythemia vera ([place])  Ref Range & Units 3wk ago 14mo ago 41mo ago  WBC 4.0 - 10.5 K/uL 8.6  13.4High     RBC 4.22 - 5.81 MIL/uL 4.30  4.27    Hemoglobin 13.0 - 17.0 g/dL 12.3Low   12.4Low     HCT 39.0 - 52.0 % 40.1  40.5    MCV 80.0 - 100.0 fL 93.3  94.8    MCH 26.0 - 34.0 pg 28.6  29.0    MCHC 30.0 - 36.0 g/dL 30.7  30.6    RDW 11.5 - 15.5 % 14.6  17.0High     Platelets 150 - 400 K/uL 237  290  302 CM   nRBC 0.0 - 0.2 % 0.0  0.0    Neutrophils Relative % % 83  88    Neutro Abs 1.7 - 7.7 K/uL 7.2  11.9High     Lymphocytes Relative % 10  7    Lymphs Abs 0.7 - 4.0 K/uL 0.8  0.9    Monocytes Relative % 3  2    Monocytes Absolute 0.1 - 1.0 K/uL 0.3  0.3    Eosinophils Relative % 1  1    Eosinophils Absolute 0.0 - 0.5 K/uL 0.1  0.1    Basophils Relative % 1  1    Basophils Absolute 0.0 - 0.1 K/uL 0.1  0.1    Immature Granulocytes % 2  1    Abs Immature Granulocytes 0.00 - 0.07 K/uL 0.14High   0.16High  CM    Comment: Performed at [facility], [address] [place], [place] [address]  Smear Review   MORPHOLOGY UNREMARKABLE CM    Resulting Agency  [facility] [place] [facility] LAB      Specimen Collected: 07/15/19 12:46 Last Resulted: 07/15/19 12:54

## 2019-09-03 ENCOUNTER — Inpatient Hospital Stay: Payer: 59 | Attending: Internal Medicine

## 2019-09-03 ENCOUNTER — Inpatient Hospital Stay: Payer: 59

## 2019-09-03 ENCOUNTER — Other Ambulatory Visit: Payer: Self-pay

## 2019-09-03 DIAGNOSIS — D45 Polycythemia vera: Secondary | ICD-10-CM

## 2019-09-03 LAB — HEMATOCRIT: HCT: 42.2 % (ref 39.0–52.0)

## 2019-09-03 LAB — HEMOGLOBIN: Hemoglobin: 13 g/dL (ref 13.0–17.0)

## 2019-09-06 ENCOUNTER — Other Ambulatory Visit: Payer: Self-pay | Admitting: Internal Medicine

## 2019-09-06 DIAGNOSIS — D45 Polycythemia vera: Secondary | ICD-10-CM

## 2019-09-16 ENCOUNTER — Other Ambulatory Visit: Payer: 59

## 2019-09-22 ENCOUNTER — Other Ambulatory Visit: Payer: Self-pay | Admitting: Internal Medicine

## 2019-09-22 DIAGNOSIS — D45 Polycythemia vera: Secondary | ICD-10-CM

## 2019-09-22 NOTE — Telephone Encounter (Signed)
Hematocrit [facility]) Order: [id number] Status:  Final result  Visible to patient:  Yes (MyChart)  Next appt:  11/03/2019 at 12:45 PM in Oncology ([facility])  Dx:  Polycythemia vera ([place])  Ref Range & Units 2wk ago  HCT 39.0 - 52.0 % 42.2   Comment: Performed at [facility], [address] [place], [place] [address]  Resulting Agency  [facility] CLIN LAB      Specimen Collected: 09/03/19 13:40 Last Resulted: 09/03/19 13:53     Lab Flowsheet   Order Details   View Encounter   Lab and Collection Details   Routing   Result History         Other Results from 09/03/2019  Hemoglobin [facility]) Order: [id number]  Status:  Final result  Visible to patient:  Yes (MyChart)  Next appt:  11/03/2019 at 12:45 PM in Oncology ([facility])  Dx:  Polycythemia vera ([place])  Ref Range & Units 2wk ago  Hemoglobin 13.0 - 17.0 g/dL 13.0   Comment: Performed at [facility], [address] [place], [place] [address]  Resulting Agency  [facility] CLIN LAB      Specimen Collected: 09/03/19 13:40 Last Resulted: 09/03/19 13:53

## 2019-11-03 ENCOUNTER — Ambulatory Visit: Payer: 59 | Admitting: Internal Medicine

## 2019-11-03 ENCOUNTER — Other Ambulatory Visit: Payer: 59

## 2019-11-04 ENCOUNTER — Inpatient Hospital Stay (HOSPITAL_BASED_OUTPATIENT_CLINIC_OR_DEPARTMENT_OTHER): Payer: 59 | Admitting: Internal Medicine

## 2019-11-04 ENCOUNTER — Inpatient Hospital Stay: Payer: 59 | Attending: Internal Medicine

## 2019-11-04 ENCOUNTER — Other Ambulatory Visit: Payer: Self-pay

## 2019-11-04 ENCOUNTER — Inpatient Hospital Stay: Payer: 59

## 2019-11-04 VITALS — BP 126/89 | HR 82 | Temp 97.5°F | Resp 20

## 2019-11-04 VITALS — BP 108/73 | HR 81 | Resp 18

## 2019-11-04 DIAGNOSIS — D45 Polycythemia vera: Secondary | ICD-10-CM

## 2019-11-04 DIAGNOSIS — D751 Secondary polycythemia: Secondary | ICD-10-CM

## 2019-11-04 LAB — CBC WITH DIFFERENTIAL/PLATELET
Abs Immature Granulocytes: 0.09 10*3/uL — ABNORMAL HIGH (ref 0.00–0.07)
Basophils Absolute: 0.1 10*3/uL (ref 0.0–0.1)
Basophils Relative: 1 %
Eosinophils Absolute: 0.1 10*3/uL (ref 0.0–0.5)
Eosinophils Relative: 1 %
HCT: 46.6 % (ref 39.0–52.0)
Hemoglobin: 14.1 g/dL (ref 13.0–17.0)
Immature Granulocytes: 1 %
Lymphocytes Relative: 10 %
Lymphs Abs: 1 10*3/uL (ref 0.7–4.0)
MCH: 28.4 pg (ref 26.0–34.0)
MCHC: 30.3 g/dL (ref 30.0–36.0)
MCV: 94 fL (ref 80.0–100.0)
Monocytes Absolute: 0.3 10*3/uL (ref 0.1–1.0)
Monocytes Relative: 3 %
Neutro Abs: 8.7 10*3/uL — ABNORMAL HIGH (ref 1.7–7.7)
Neutrophils Relative %: 84 %
Platelets: 225 10*3/uL (ref 150–400)
RBC: 4.96 MIL/uL (ref 4.22–5.81)
RDW: 14.4 % (ref 11.5–15.5)
WBC: 10.3 10*3/uL (ref 4.0–10.5)
nRBC: 0 % (ref 0.0–0.2)

## 2019-11-04 LAB — COMPREHENSIVE METABOLIC PANEL
ALT: 20 U/L (ref 0–44)
AST: 26 U/L (ref 15–41)
Albumin: 4.3 g/dL (ref 3.5–5.0)
Alkaline Phosphatase: 97 U/L (ref 38–126)
Anion gap: 8 (ref 5–15)
BUN: 8 mg/dL (ref 8–23)
CO2: 26 mmol/L (ref 22–32)
Calcium: 8.7 mg/dL — ABNORMAL LOW (ref 8.9–10.3)
Chloride: 104 mmol/L (ref 98–111)
Creatinine, Ser: 0.92 mg/dL (ref 0.61–1.24)
GFR calc Af Amer: >60 mL/min (ref >60)
GFR calc non Af Amer: >60 mL/min (ref >60)
Glucose, Bld: 121 mg/dL — ABNORMAL HIGH (ref 70–99)
Potassium: 4.2 mmol/L (ref 3.5–5.1)
Sodium: 138 mmol/L (ref 135–145)
Total Bilirubin: 1 mg/dL (ref 0.3–1.2)
Total Protein: 7.2 g/dL (ref 6.5–8.1)

## 2019-11-04 LAB — LACTATE DEHYDROGENASE: LDH: 188 U/L (ref 98–192)

## 2019-11-04 NOTE — Assessment & Plan Note (Signed)
#   Polycythemia vera Jak 2+; on Hydrea 500 mg twice a day; except Saturday Sunday-extra pill hemoglobin is  12.3 hematocrit 46 platelet-232.  Proceed with phleboltomy today.   # No concerns for transformation/progression.  Continue current dose of Hydrea.  No phlebotomy today.  # "Stroke"- left eye- nonarteritic ischemic optic neuropathy.  Unrelated to polycythemia vera - stable.   #Disposition # proceed with phlebotomy today # In 2 month H&H//possible phlebotomy #Follow-up-4 months/MD-CBC/CMP LDH; possible phlebotomy- Dr.B

## 2019-11-06 NOTE — Progress Notes (Signed)
[place] OFFICE PROGRESS NOTE  Patient Care Team: Dion Body, MD as PCP - General (Family Medicine)  Cancer Staging No matching staging information was found for the patient.   Oncology History Overview Note  # 2014- POLYCYTHEMIA VERA - JAK-2 Positive; on Hydrea 500mg /day; aspirin 81 mg a day; phlebotomy for Hct >43 [headaches];   # MAY 2019- BMBx- pan-hypercellular; no evidence of significant fibrosis; MAY [place]- 1389 cc/ splenomegaly; Oct 11th 2019- Increase in splenomegaly; Increased Hydrea 1500mg /day.   # nonarteritic ischemic optic neuropathy-Left eye [may 2018; Dr.Dingledein/Dr.Sitko at [place]; Previous Right eye ---------------------------------------    DIAGNOSIS: [ ]  POLYCYTHEMIA VERA  ;GOALS: control  CURRENT/MOST RECENT THERAPY [ ]  Hydrea        Polycythemia vera (HCC)      INTERVAL HISTORY:  Derek [name] 63 y.o.  male pleasant patient above history of polycythemia vera is here for follow-up.   Patient appetite is good but no weight loss but no nausea no vomiting.  No headaches.  No strokes.  No night sweats or diarrhea.  Mild fatigue.  Review of Systems  Constitutional: Positive for malaise/fatigue. Negative for chills, diaphoresis, fever and weight loss.  HENT: Negative for nosebleeds and sore throat.   Eyes: Negative for double vision.  Respiratory: Negative for cough, hemoptysis, sputum production, shortness of breath and wheezing.   Cardiovascular: Negative for chest pain, palpitations, orthopnea and leg swelling.  Gastrointestinal: Negative for abdominal pain, blood in stool, constipation, diarrhea, heartburn, melena, nausea and vomiting.  Genitourinary: Negative for dysuria, frequency and urgency.  Musculoskeletal: Negative for back pain and joint pain.  Skin: Negative.  Negative for itching and rash.  Neurological: Negative for dizziness, tingling, focal weakness, weakness and headaches.  Endo/Heme/Allergies: Does not  bruise/bleed easily.  Psychiatric/Behavioral: Negative for depression. The patient is not nervous/anxious and does not have insomnia.       PAST MEDICAL HISTORY :  Past Medical History:  Diagnosis Date  . Arthritis   . GERD (gastroesophageal reflux disease)   . Gout   . Low serum testosterone level   . Polycythemia vera ([place])   . Polycythemia, secondary 06/08/2015  . Pulmonary nodules   . Thoracic spine fracture (HCC)    compression fracture following a fall    PAST SURGICAL HISTORY :   Past Surgical History:  Procedure Laterality Date  . COLONOSCOPY  2013    FAMILY HISTORY :   Family History  Problem Relation Age of Onset  . Throat cancer Other        uncle  . Migraines Neg Hx   . Multiple sclerosis Neg Hx   . Neurofibromatosis Neg Hx   . Parkinsonism Neg Hx   . Seizures Neg Hx   . Neuropathy Neg Hx     SOCIAL HISTORY:   Social History   Tobacco Use  . Smoking status: Never Smoker  . Smokeless tobacco: Never Used  Substance Use Topics  . Alcohol use: No    Alcohol/week: 0.0 standard drinks    Comment: occasional alcohol use  . Drug use: No    ALLERGIES:  is allergic to penicillin g.  MEDICATIONS:  Current Outpatient Medications  Medication Sig Dispense Refill  . amLODipine (NORVASC) 5 MG tablet Take 5 mg daily by mouth.  11  . aspirin EC 81 MG tablet Take 81 mg daily by mouth.     . Cholecalciferol (VITAMIN D-1000 MAX ST) 1000 units tablet Take 1,000 Units daily by mouth.     . Esomeprazole  Magnesium (NEXIUM PO) Take 1 capsule by mouth daily.    . fluticasone (FLONASE) 50 MCG/ACT nasal spray     . hydroxyurea (HYDREA) 500 MG capsule TAKE 1 TABLET BY MOUTH IN THE AM AND PM MON-FRI AND TAKE 1 TABLET IN THE AM AND 2 TAB IN THE PM SAT-SUN 810 capsule 1  . Multiple Vitamins-Minerals (MULTIVITAMIN ADULT PO) Take 1 tablet by mouth 1 day or 1 dose.     No current facility-administered medications for this visit.     PHYSICAL EXAMINATION: ECOG PERFORMANCE  STATUS: 0 - Asymptomatic  BP 126/89   Pulse 82   Temp (!) 97.5 F (36.4 C) (Tympanic)   Resp 20   There were no vitals filed for this visit.  Physical Exam  Constitutional: He is oriented to person, place, and time and well-developed, well-nourished, and in no distress.  HENT:  Head: Normocephalic and atraumatic.  Mouth/Throat: Oropharynx is clear and moist. No oropharyngeal exudate.  Eyes: Pupils are equal, round, and reactive to light.  Neck: Normal range of motion. Neck supple.  Cardiovascular: Normal rate and regular rhythm.  Pulmonary/Chest: No respiratory distress. He has no wheezes.  Abdominal: Soft. Bowel sounds are normal. He exhibits no distension and no mass. There is no abdominal tenderness. There is no rebound and no guarding.  Positive for mild splenomegaly.  Musculoskeletal: Normal range of motion.        General: No tenderness or edema.  Neurological: He is alert and oriented to person, place, and time.  Skin: Skin is warm.  Psychiatric: Affect normal.       LABORATORY DATA:  I have reviewed the data as listed    Component Value Date/Time   NA 138 11/04/2019 1253   NA 140 03/02/2014 0835   K 4.2 11/04/2019 1253   K 3.8 03/02/2014 0835   CL 104 11/04/2019 1253   CL 109 (H) 03/02/2014 0835   CO2 26 11/04/2019 1253   CO2 26 03/02/2014 0835   GLUCOSE 121 (H) 11/04/2019 1253   GLUCOSE 108 (H) 03/02/2014 0835   BUN 8 11/04/2019 1253   BUN 9 03/02/2014 0835   CREATININE 0.92 11/04/2019 1253   CREATININE 0.99 03/02/2014 0835   CALCIUM 8.7 (L) 11/04/2019 1253   CALCIUM 8.6 03/02/2014 0835   PROT 7.2 11/04/2019 1253   PROT 7.0 03/02/2014 0835   ALBUMIN 4.3 11/04/2019 1253   ALBUMIN 3.7 03/02/2014 0835   AST 26 11/04/2019 1253   AST 20 03/02/2014 0835   ALT 20 11/04/2019 1253   ALT 24 03/02/2014 0835   ALKPHOS 97 11/04/2019 1253   ALKPHOS 96 03/02/2014 0835   BILITOT 1.0 11/04/2019 1253   BILITOT 0.6 03/02/2014 0835   GFRNONAA >60 11/04/2019 1253    GFRNONAA >60 03/02/2014 0835   GFRAA >60 11/04/2019 1253   GFRAA >60 03/02/2014 0835    No results found for: SPEP, UPEP  Lab Results  Component Value Date   WBC 10.3 11/04/2019   NEUTROABS 8.7 (H) 11/04/2019   HGB 14.1 11/04/2019   HCT 46.6 11/04/2019   MCV 94.0 11/04/2019   PLT 225 11/04/2019      Chemistry      Component Value Date/Time   NA 138 11/04/2019 1253   NA 140 03/02/2014 0835   K 4.2 11/04/2019 1253   K 3.8 03/02/2014 0835   CL 104 11/04/2019 1253   CL 109 (H) 03/02/2014 0835   CO2 26 11/04/2019 1253   CO2 26 03/02/2014 0835  BUN 8 11/04/2019 1253   BUN 9 03/02/2014 0835   CREATININE 0.92 11/04/2019 1253   CREATININE 0.99 03/02/2014 0835      Component Value Date/Time   CALCIUM 8.7 (L) 11/04/2019 1253   CALCIUM 8.6 03/02/2014 0835   ALKPHOS 97 11/04/2019 1253   ALKPHOS 96 03/02/2014 0835   AST 26 11/04/2019 1253   AST 20 03/02/2014 0835   ALT 20 11/04/2019 1253   ALT 24 03/02/2014 0835   BILITOT 1.0 11/04/2019 1253   BILITOT 0.6 03/02/2014 0835       RADIOGRAPHIC STUDIES: I have personally reviewed the radiological images as listed and agreed with the findings in the report. No results found.   ASSESSMENT & PLAN:  Polycythemia vera ([place]) # Polycythemia vera Jak 2+; on Hydrea 500 mg twice a day; except Saturday Sunday-extra pill hemoglobin is  12.3 hematocrit 46 platelet-232.  Proceed with phleboltomy today.   # No concerns for transformation/progression.  Continue current dose of Hydrea.  No phlebotomy today.  # "Stroke"- left eye- nonarteritic ischemic optic neuropathy.  Unrelated to polycythemia vera - stable.   #Disposition # proceed with phlebotomy today # In 2 month H&H//possible phlebotomy #Follow-up-4 months/MD-CBC/CMP LDH; possible phlebotomy- Dr.B   Orders Placed This Encounter  Procedures  . Hematocrit ([facility])    Standing Status:   Future    Standing Expiration Date:   11/03/2020  . Hemoglobin [facility])    Standing Status:    Future    Standing Expiration Date:   11/03/2020  . CBC with Differential    Standing Status:   Future    Standing Expiration Date:   11/03/2020  . Comprehensive metabolic panel    Standing Status:   Future    Standing Expiration Date:   11/03/2020  . Lactate dehydrogenase    Standing Status:   Future    Standing Expiration Date:   11/03/2020   All questions were answered. The patient knows to call the clinic with any problems, questions or concerns.      Cammie Sickle, MD 11/06/2019 3:15 PM

## 2019-11-19 ENCOUNTER — Other Ambulatory Visit: Payer: Self-pay | Admitting: Internal Medicine

## 2019-11-19 ENCOUNTER — Telehealth: Payer: Self-pay | Admitting: *Deleted

## 2019-11-19 DIAGNOSIS — D45 Polycythemia vera: Secondary | ICD-10-CM

## 2019-11-19 NOTE — Telephone Encounter (Signed)
Call returned to patient and informed that he has [place] appointment at [facility] 12/9 @ 1045 and Dr B will call him with results

## 2019-11-19 NOTE — Telephone Encounter (Signed)
Patient called reporting that he knows his spleen is enlarged, but he is now having soreness at his left side and is wondering if he should ave an US done. Please advise

## 2019-11-19 NOTE — Progress Notes (Signed)
Call returned to patient informed of [place] appointment and place 12/9 @ 1045 [facility] and that Dr B will call him with results

## 2019-11-19 NOTE — Progress Notes (Signed)
Derek [name]- please inform pt that I am ordering US spleen; will call with results

## 2019-11-19 NOTE — Telephone Encounter (Signed)
Ordered US spleen.

## 2019-11-25 ENCOUNTER — Ambulatory Visit
Admission: RE | Admit: 2019-11-25 | Discharge: 2019-11-25 | Disposition: A | Discharge status: Home / Self Care | Payer: 59 | Source: Ambulatory Visit | Attending: Internal Medicine | Admitting: Internal Medicine

## 2019-11-25 ENCOUNTER — Other Ambulatory Visit: Payer: Self-pay

## 2019-11-25 DIAGNOSIS — D45 Polycythemia vera: Secondary | ICD-10-CM

## 2019-11-26 ENCOUNTER — Telehealth: Payer: Self-pay | Admitting: Internal Medicine

## 2019-11-26 ENCOUNTER — Other Ambulatory Visit: Payer: Self-pay | Admitting: Licensed Clinical Social Worker

## 2019-11-26 DIAGNOSIS — D45 Polycythemia vera: Secondary | ICD-10-CM

## 2019-11-26 NOTE — Telephone Encounter (Signed)
On 12/09- Spoke to patient regarding ultrasound results-spleen mildly enlarged/overall stable volume.  Patient interested in a CBC checked sooner.  C/T-please schedule lab-H&H/possible phlebotomy in the next 1 to 2 weeks.

## 2019-12-02 ENCOUNTER — Other Ambulatory Visit: Payer: Self-pay

## 2019-12-03 ENCOUNTER — Inpatient Hospital Stay: Payer: 59

## 2019-12-07 ENCOUNTER — Telehealth: Payer: Self-pay | Admitting: *Deleted

## 2019-12-07 DIAGNOSIS — Z20822 Contact with and (suspected) exposure to covid-19: Secondary | ICD-10-CM

## 2019-12-07 NOTE — Telephone Encounter (Signed)
Patient called reporting that he thinks he has COVID and he cannot find a place to get tested. He is asking what to do. Please advise

## 2019-12-07 NOTE — Telephone Encounter (Signed)
Patient returned my phone call. He stated that last Tuesday that he had cold like symptoms which progressed to "flu like symptoms and decreased appetite/decrease in taste." denies any fever. He went to urgent care for testing. He is waiting on his test results.

## 2019-12-07 NOTE — Telephone Encounter (Signed)
Please talk to pt re: his sypmtoms; and order the COVID testing.  Thanks GB

## 2019-12-07 NOTE — Telephone Encounter (Signed)
Contacted the patient. Left vm to request to a return phone call. covid testing orders entered.  When pt calls back - will need to inquire about further details on pt's symptoms. Pt will need to call [phone] or the patient can text "COVID" AT [phone] OR go to [email or web address] to make an apt.

## 2019-12-08 ENCOUNTER — Emergency Department
Admission: EM | Admit: 2019-12-08 | Discharge: 2019-12-08 | Disposition: A | Discharge status: Home / Self Care | Payer: Managed Care, Other (non HMO) | Attending: Emergency Medicine | Admitting: Emergency Medicine

## 2019-12-08 ENCOUNTER — Other Ambulatory Visit: Payer: Self-pay | Admitting: *Deleted

## 2019-12-08 ENCOUNTER — Emergency Department: Payer: Managed Care, Other (non HMO)

## 2019-12-08 ENCOUNTER — Telehealth: Payer: Self-pay | Admitting: *Deleted

## 2019-12-08 ENCOUNTER — Other Ambulatory Visit: Payer: Self-pay

## 2019-12-08 ENCOUNTER — Encounter: Payer: Self-pay | Admitting: *Deleted

## 2019-12-08 DIAGNOSIS — E860 Dehydration: Secondary | ICD-10-CM | POA: Diagnosis not present

## 2019-12-08 DIAGNOSIS — Z7982 Long term (current) use of aspirin: Secondary | ICD-10-CM | POA: Diagnosis not present

## 2019-12-08 DIAGNOSIS — R070 Pain in throat: Secondary | ICD-10-CM | POA: Diagnosis not present

## 2019-12-08 DIAGNOSIS — R05 Cough: Secondary | ICD-10-CM | POA: Diagnosis present

## 2019-12-08 DIAGNOSIS — Z79899 Other long term (current) drug therapy: Secondary | ICD-10-CM | POA: Insufficient documentation

## 2019-12-08 DIAGNOSIS — U071 COVID-19: Secondary | ICD-10-CM | POA: Diagnosis not present

## 2019-12-08 LAB — COMPREHENSIVE METABOLIC PANEL
ALT: 21 U/L (ref 0–44)
AST: 35 U/L (ref 15–41)
Albumin: 3.8 g/dL (ref 3.5–5.0)
Alkaline Phosphatase: 77 U/L (ref 38–126)
Anion gap: 12 (ref 5–15)
BUN: 10 mg/dL (ref 8–23)
CO2: 25 mmol/L (ref 22–32)
Calcium: 8.4 mg/dL — ABNORMAL LOW (ref 8.9–10.3)
Chloride: 99 mmol/L (ref 98–111)
Creatinine, Ser: 0.98 mg/dL (ref 0.61–1.24)
GFR calc Af Amer: >60 mL/min (ref >60)
GFR calc non Af Amer: >60 mL/min (ref >60)
Glucose, Bld: 130 mg/dL — ABNORMAL HIGH (ref 70–99)
Potassium: 3.7 mmol/L (ref 3.5–5.1)
Sodium: 136 mmol/L (ref 135–145)
Total Bilirubin: 1.8 mg/dL — ABNORMAL HIGH (ref 0.3–1.2)
Total Protein: 7.1 g/dL (ref 6.5–8.1)

## 2019-12-08 LAB — CBC WITH DIFFERENTIAL/PLATELET
Abs Immature Granulocytes: 0.02 10*3/uL (ref 0.00–0.07)
Basophils Absolute: 0 10*3/uL (ref 0.0–0.1)
Basophils Relative: 0 %
Eosinophils Absolute: 0 10*3/uL (ref 0.0–0.5)
Eosinophils Relative: 0 %
HCT: 40.8 % (ref 39.0–52.0)
Hemoglobin: 13.1 g/dL (ref 13.0–17.0)
Immature Granulocytes: 1 %
Lymphocytes Relative: 6 %
Lymphs Abs: 0.3 10*3/uL — ABNORMAL LOW (ref 0.7–4.0)
MCH: 28.9 pg (ref 26.0–34.0)
MCHC: 32.1 g/dL (ref 30.0–36.0)
MCV: 90.1 fL (ref 80.0–100.0)
Monocytes Absolute: 0.1 10*3/uL (ref 0.1–1.0)
Monocytes Relative: 3 %
Neutro Abs: 3.8 10*3/uL (ref 1.7–7.7)
Neutrophils Relative %: 90 %
Platelets: 146 10*3/uL — ABNORMAL LOW (ref 150–400)
RBC: 4.53 MIL/uL (ref 4.22–5.81)
RDW: 16.3 % — ABNORMAL HIGH (ref 11.5–15.5)
WBC: 4.3 10*3/uL (ref 4.0–10.5)
nRBC: 0 % (ref 0.0–0.2)

## 2019-12-08 LAB — LACTIC ACID, PLASMA: Lactic Acid, Venous: 1.5 mmol/L (ref 0.5–1.9)

## 2019-12-08 LAB — POC SARS CORONAVIRUS 2 AG: SARS Coronavirus 2 Ag: POSITIVE — AB

## 2019-12-08 MED ORDER — DEXAMETHASONE SODIUM PHOSPHATE 10 MG/ML IJ SOLN
10.0000 mg | Freq: Once | INTRAMUSCULAR | Status: AC
  Administered 2019-12-08: 10 mg via INTRAVENOUS
  Filled 2019-12-08: qty 1

## 2019-12-08 MED ORDER — AZITHROMYCIN 250 MG PO TABS: ORAL_TABLET | ORAL | 0 refills | Status: DC

## 2019-12-08 MED ORDER — ONDANSETRON HCL 4 MG/2ML IJ SOLN
4.0000 mg | Freq: Once | INTRAMUSCULAR | Status: AC
  Administered 2019-12-08: 4 mg via INTRAVENOUS
  Filled 2019-12-08: qty 2

## 2019-12-08 MED ORDER — SODIUM CHLORIDE 0.9 % IV SOLN: 500.0000 mg | Freq: Once | INTRAVENOUS | Status: DC

## 2019-12-08 MED ORDER — ACETAMINOPHEN 325 MG PO TABS
650.0000 mg | ORAL_TABLET | Freq: Once | ORAL | Status: AC
  Administered 2019-12-08: 650 mg via ORAL
  Filled 2019-12-08: qty 2

## 2019-12-08 MED ORDER — LACTATED RINGERS IV BOLUS
1000.0000 mL | Freq: Once | INTRAVENOUS | Status: AC
  Administered 2019-12-08: 1000 mL via INTRAVENOUS

## 2019-12-08 MED ORDER — SODIUM CHLORIDE 0.9 % IV SOLN
2.0000 g | Freq: Once | INTRAVENOUS | Status: AC
  Administered 2019-12-08: 2 g via INTRAVENOUS
  Filled 2019-12-08: qty 20

## 2019-12-08 MED ORDER — ACETAMINOPHEN 500 MG PO TABS
1000.0000 mg | ORAL_TABLET | Freq: Once | ORAL | Status: AC
  Administered 2019-12-08: 17:00:00 1000 mg via ORAL
  Filled 2019-12-08: qty 2

## 2019-12-08 MED ORDER — AZITHROMYCIN 500 MG PO TABS
500.0000 mg | ORAL_TABLET | Freq: Once | ORAL | Status: AC
  Administered 2019-12-08: 500 mg via ORAL
  Filled 2019-12-08: qty 1

## 2019-12-08 MED ORDER — ONDANSETRON 4 MG PO TBDP: 4.0000 mg | ORAL_TABLET | Freq: Three times a day (TID) | ORAL | 0 refills | Status: DC | PRN

## 2019-12-08 MED ORDER — SODIUM CHLORIDE 0.9 % IV SOLN: 2.0000 g | Freq: Once | INTRAVENOUS | Status: DC

## 2019-12-08 NOTE — ED Provider Notes (Signed)
[facility] Emergency Department Provider Note  ____________________________________________  Time seen: Approximately 4:54 PM  The following is a medical screening exam note. It is intended that the patient await an ER room assignment for detailed exam, diagnosis, and disposition.  I have reviewed the triage vital signs.    HISTORY  Chief Complaint Cough   HPI Derek [name] is a 63 y.o. male presents to the emergency department for treatment of cough and throat irritation x 1 week. No chest pain. Covid-19 test yesterday is not resulted. No known fever at home. No alleviating measures prior to arrival.  Past Medical History:  Diagnosis Date  . Arthritis   . GERD (gastroesophageal reflux disease)   . Gout   . Low serum testosterone level   . Polycythemia vera ([place])   . Polycythemia, secondary 06/08/2015  . Pulmonary nodules   . Thoracic spine fracture (HCC)    compression fracture following a fall    Patient Active Problem List   Diagnosis Date Noted  . Arthritis 04/18/2016  . Acid reflux 04/18/2016  . Gout 04/18/2016  . Polycythemia vera ([place]) 04/18/2016  . Polycythemia, secondary 06/08/2015  . Conjunctival hyperemia 09/28/2013  . Cephalalgia 09/28/2013    Past Surgical History:  Procedure Laterality Date  . COLONOSCOPY  2013    Prior to Admission medications   Medication Sig Start Date End Date Taking? Authorizing Provider  amLODipine (NORVASC) 5 MG tablet Take 5 mg daily by mouth. 10/12/17   [provider]  aspirin EC 81 MG tablet Take 81 mg daily by mouth.     [provider]  Cholecalciferol (VITAMIN D-1000 MAX ST) 1000 units tablet Take 1,000 Units daily by mouth.     [provider]  Esomeprazole Magnesium (NEXIUM PO) Take 1 capsule by mouth daily.    [provider]  fluticasone Asencion Islam) 50 MCG/ACT nasal spray  07/29/14   [provider]  hydroxyurea (HYDREA) 500 MG capsule TAKE 1 TABLET  BY MOUTH IN THE AM AND PM MON-FRI AND TAKE 1 TABLET IN THE AM AND 2 TAB IN THE PM SAT-SUN 09/22/19   Cammie Sickle, MD  Multiple Vitamins-Minerals (MULTIVITAMIN ADULT PO) Take 1 tablet by mouth 1 day or 1 dose.    [provider]    Allergies Penicillin g  Family History  Problem Relation Age of Onset  . Throat cancer Other        uncle  . Migraines Neg Hx   . Multiple sclerosis Neg Hx   . Neurofibromatosis Neg Hx   . Parkinsonism Neg Hx   . Seizures Neg Hx   . Neuropathy Neg Hx     Social History Social History   Tobacco Use  . Smoking status: Never Smoker  . Smokeless tobacco: Never Used  Substance Use Topics  . Alcohol use: No    Alcohol/week: 0.0 standard drinks    Comment: occasional alcohol use  . Drug use: No    Review of Systems Constitutional: Negataive for fever. ENT: Positive for sore throat. Respiratory: Positive for cough Gastrointestinal: No abdominal pain.  No nausea, no vomiting.  No diarrhea.  Musculoskeletal: Positive for generalized body aches. Skin: Negative for rash/lesion/wound. Neurological: Negative for headaches, focal weakness or numbness.  ____________________________________________   PHYSICAL EXAM:  VITAL SIGNS:  Today's Vitals   12/08/19 1654 12/08/19 1656  Pulse: (!) 110   Resp: (!) 22   Temp: (!) 102 F (38.9 C)   TempSrc: Oral   SpO2: 96%  Weight:  85.7 kg  Height:  5\' 9"  (1.753 m)  PainSc:  0-No pain   Body mass index is 27.91 kg/m.   Constitutional: Alert and oriented. No acute distress. Head: Atraumatic. Nose: No congestion/rhinnorhea. Mouth/Throat: Mucous membranes are moist. Neck: No stridor.  Cardiovascular: Good peripheral circulation. Respiratory: Normal respiratory effort. Musculoskeletal: No restriction Neurologic:  Normal speech and language. No gross focal neurologic deficits are appreciated. Speech is normal. No gait instability. Skin:  Skin is warm, dry and intact. No rash  noted. Psychiatric: Mood and affect are normal. Speech and behavior are normal.  ____________________________________________   LABS (all labs ordered are listed, but only abnormal results are displayed)  Labs Reviewed - No data to display ____________________________________________  EKG  Signed by MD ____________________________________________   INITIAL CLINICAL IMPRESSION(S)   Likely COVID-19.  Patient instructed to await ER room assignment. Labs, ECG, and chest xray ordered as well at [facility].    Victorino Dike, FNP 12/08/19 1708    Nance Pear, MD 12/08/19 [phone]

## 2019-12-08 NOTE — Telephone Encounter (Signed)
Patient contacted the cancer center center at 345 pm to request for iv fluids. States that he feels weaker and wants to come into the clinic. I advised that he is not able to be treated in the cancer center. He was just tested for covid yesterday at urgent care - results not yet available. Encouraged pt to increase po fluid intake. Give pt symptomatic for covid, it may be in his best interest to seek emergency care if symptoms persistently worsen. He is not able to be treated for covid symptoms in the cancer center.  Dr. Rogue Bussing made aware of patient's concerns. Pt states that he "may wait until tomorrow to get emergency services."

## 2019-12-08 NOTE — ED Triage Notes (Signed)
First Nurse Note:  Arrives with c/o cough and nausea x 3 days.  Patient is AAOx3.  Skin warm and dry. No SOB/ DOE.  NAD

## 2019-12-08 NOTE — ED Notes (Signed)
Patient's O2 sat ranged from 93-97% with ambulation.

## 2019-12-08 NOTE — ED Notes (Signed)
Pt Is ambulatory from triage.  He states he has not had an appetite for 3-4 days and diarrhea started today.  He reports feeling weak.  He states he has a tickle in his throat but no cough.  Fever of 158f at 1654 and received Tylenol 1000 mg.  Current temp is 99.31f.  Pt appears to be in no acute distress and is expressing no needs at this time.

## 2019-12-08 NOTE — ED Provider Notes (Signed)
[facility] Emergency Department Provider Note  ____________________________________________   First MD Initiated Contact with Patient 12/08/19 1930     (approximate)  I have reviewed the triage vital signs and the nursing notes.   HISTORY  Chief Complaint Cough    HPI Derek [name] is a 63 y.o. male  Here with cough, fever, nausea, poor appetite. Sx began 7-8 days ago as mild cough, nausea. Since then, cough has improved but he's had some persistent coughing intermittently, along with poor appetite. Has had little to eat/drink. He's felt some mild intermittent abd cramping but no persistent pain. Has had loose stools today as well. Denies any CP, SOB. No known COVID exposures. Has not taken any meds. No known alleviating factors. No h/o lung disease.        Past Medical History:  Diagnosis Date  . Arthritis   . GERD (gastroesophageal reflux disease)   . Gout   . Low serum testosterone level   . Polycythemia vera ([place])   . Polycythemia, secondary 06/08/2015  . Pulmonary nodules   . Thoracic spine fracture (HCC)    compression fracture following a fall    Patient Active Problem List   Diagnosis Date Noted  . Arthritis 04/18/2016  . Acid reflux 04/18/2016  . Gout 04/18/2016  . Polycythemia vera ([place]) 04/18/2016  . Polycythemia, secondary 06/08/2015  . Conjunctival hyperemia 09/28/2013  . Cephalalgia 09/28/2013    Past Surgical History:  Procedure Laterality Date  . COLONOSCOPY  2013    Prior to Admission medications   Medication Sig Start Date End Date Taking? Authorizing Provider  amLODipine (NORVASC) 5 MG tablet Take 5 mg daily by mouth. 10/12/17   [provider]  aspirin EC 81 MG tablet Take 81 mg daily by mouth.     [provider]  azithromycin (ZITHROMAX Z-PAK) 250 MG tablet Take 2 tablets (500 mg) on  Day 1,  followed by 1 tablet (250 mg) once daily on Days 2 through 5. 12/08/19   Duffy Bruce, MD   Cholecalciferol (VITAMIN D-1000 MAX ST) 1000 units tablet Take 1,000 Units daily by mouth.     [provider]  Esomeprazole Magnesium (NEXIUM PO) Take 1 capsule by mouth daily.    [provider]  fluticasone Asencion Islam) 50 MCG/ACT nasal spray  07/29/14   [provider]  hydroxyurea (HYDREA) 500 MG capsule TAKE 1 TABLET BY MOUTH IN THE AM AND PM MON-FRI AND TAKE 1 TABLET IN THE AM AND 2 TAB IN THE PM SAT-SUN 09/22/19   Cammie Sickle, MD  Multiple Vitamins-Minerals (MULTIVITAMIN ADULT PO) Take 1 tablet by mouth 1 day or 1 dose.    [provider]  ondansetron (ZOFRAN ODT) 4 MG disintegrating tablet Take 1 tablet (4 mg total) by mouth every 8 (eight) hours as needed for nausea or vomiting. 12/08/19   Duffy Bruce, MD    Allergies Penicillin g  Family History  Problem Relation Age of Onset  . Throat cancer Other        uncle  . Migraines Neg Hx   . Multiple sclerosis Neg Hx   . Neurofibromatosis Neg Hx   . Parkinsonism Neg Hx   . Seizures Neg Hx   . Neuropathy Neg Hx     Social History Social History   Tobacco Use  . Smoking status: Never Smoker  . Smokeless tobacco: Never Used  Substance Use Topics  . Alcohol use: Yes    Alcohol/week: 0.0 standard drinks  Comment: occasional alcohol use  . Drug use: No    Review of Systems  Review of Systems  Constitutional: Positive for chills and fatigue. Negative for fever.  HENT: Negative for sore throat.   Respiratory: Positive for cough. Negative for shortness of breath.   Cardiovascular: Negative for chest pain.  Gastrointestinal: Positive for nausea. Negative for abdominal pain.  Genitourinary: Negative for flank pain.  Musculoskeletal: Negative for neck pain.  Skin: Negative for rash and wound.  Allergic/Immunologic: Negative for immunocompromised state.  Neurological: Positive for weakness. Negative for numbness.  Hematological: Does not bruise/bleed easily.  All other systems  reviewed and are negative.    ____________________________________________  PHYSICAL EXAM:      VITAL SIGNS: ED Triage Vitals  Enc Vitals Group     BP 12/08/19 1939 124/74     Pulse Rate 12/08/19 1654 (!) 110     Resp 12/08/19 1654 (!) 22     Temp 12/08/19 1654 (!) 102 F (38.9 C)     Temp Source 12/08/19 1654 Oral     SpO2 12/08/19 1654 96 %     Weight 12/08/19 1656 189 lb (85.7 kg)     Height 12/08/19 1656 5\' 9"  (1.753 m)     Head Circumference --      Peak Flow --      Pain Score 12/08/19 1656 0     Pain Loc --      Pain Edu? --      Excl. in [place]? --      Physical Exam Vitals and nursing note reviewed.  Constitutional:      General: He is not in acute distress.    Appearance: He is well-developed.  HENT:     Head: Normocephalic and atraumatic.     Mouth/Throat:     Mouth: Mucous membranes are dry.  Eyes:     Conjunctiva/sclera: Conjunctivae normal.  Cardiovascular:     Rate and Rhythm: Normal rate and regular rhythm.     Heart sounds: Normal heart sounds. No murmur. No friction rub.  Pulmonary:     Effort: Pulmonary effort is normal. No respiratory distress.     Breath sounds: Normal breath sounds. No wheezing or rales.  Abdominal:     General: Abdomen is flat. There is no distension.     Palpations: Abdomen is soft.     Tenderness: There is no abdominal tenderness.     Comments: Increased bowel sounds  Musculoskeletal:     Cervical back: Neck supple.  Skin:    General: Skin is warm.     Capillary Refill: Capillary refill takes less than 2 seconds.  Neurological:     Mental Status: He is alert and oriented to person, place, and time.     Motor: No abnormal muscle tone.       ____________________________________________   LABS (all labs ordered are listed, but only abnormal results are displayed)  Labs Reviewed  COMPREHENSIVE METABOLIC PANEL - Abnormal; Notable for the following components:      Result Value   Glucose, Bld 130 (*)    Calcium 8.4  (*)    Total Bilirubin 1.8 (*)    All other components within normal limits  CBC WITH DIFFERENTIAL/PLATELET - Abnormal; Notable for the following components:   RDW 16.3 (*)    Platelets 146 (*)    Lymphs Abs 0.3 (*)    All other components within normal limits  POC SARS CORONAVIRUS 2 AG - Abnormal; Notable for the following  components:   SARS Coronavirus 2 Ag POSITIVE (*)    All other components within normal limits  LACTIC ACID, PLASMA  LACTIC ACID, PLASMA  POC SARS CORONAVIRUS 2 AG -  ED    ____________________________________________  EKG: Sinus tachycardia, VR 105. PR 160, QRS 82, QTc 438. No ST elevations or depressions. No ischemia or infarct. ________________________________________  RADIOLOGY All imaging, including plain films, CT scans, and ultrasounds, independently reviewed by me, and interpretations confirmed via formal radiology reads.  ED MD interpretation:   CXR: Subtle retrocardiac opacity, increased interstitial marking likely atypical infection  Official radiology report(s): DG Chest 2 View  Addendum Date: 12/08/2019   ADDENDUM REPORT: 12/08/2019 18:10 ADDENDUM: Subtle retrocardiac opacity could also represent developing infection or atelectasis. Electronically Signed   By: Zetta Bills M.D.   On: 12/08/2019 18:10   Result Date: 12/08/2019 CLINICAL DATA:  Cough cough, fever EXAM: CHEST - 2 VIEW COMPARISON:  CT chest of 03/18/2015 FINDINGS: Cardiomediastinal contours are normal. Subtle increased interstitial markings with slight asymmetry. No signs of dense consolidation or effusion. Visualized skeletal structures are unremarkable. IMPRESSION: 1. Subtle increased interstitial markings with slight asymmetry. Findings could represent early viral or atypical pneumonia. 2. No signs of dense consolidation or effusion. Electronically Signed: By: Zetta Bills M.D. On: 12/08/2019 18:07    ____________________________________________  PROCEDURES   Procedure(s)  performed (including Critical Care):  Procedures  ____________________________________________  INITIAL IMPRESSION / MDM / [place] / ED COURSE  As part of my medical decision making, I reviewed the following data within the [facility] notes reviewed and incorporated, Old chart reviewed, Notes from prior ED visits, and [place] Controlled Substance Database       *Derek [name] was evaluated in Emergency Department on 12/08/2019 for the symptoms described in the history of present illness. He was evaluated in the context of the global COVID-19 pandemic, which necessitated consideration that the patient might be at risk for infection with the SARS-CoV-2 virus that causes COVID-19. Institutional protocols and algorithms that pertain to the evaluation of patients at risk for COVID-19 are in a state of rapid change based on information released by regulatory bodies including the CDC and federal and state organizations. These policies and algorithms were followed during the patient's care in the ED.  Some ED evaluations and interventions may be delayed as a result of limited staffing during the pandemic.*     Medical Decision Making:  63 yo M here with poor appetite, general fatigue, and fever. Pt febrile on arrival but nontoxic, satting well on RA. CXR shows possible PNA. Pt is COVID-19 positive which likely explains his sx. Lab work overall veyr reassuring otherwise. ABdomen soft w/o focal TTP or signs of surgical abnormality. Ambulatory in ED without issue. Given focal findings on CXR, will tx with azithro and pt given dose of decadron. Antiemetics rx'ed. Will discharge home with good return precautions.  ____________________________________________  FINAL CLINICAL IMPRESSION(S) / ED DIAGNOSES  Final diagnoses:  COVID-19  Dehydration     MEDICATIONS GIVEN DURING THIS VISIT:  Medications  acetaminophen (TYLENOL) tablet 1,000 mg (1,000 mg Oral Given 12/08/19  1708)  lactated ringers bolus 1,000 mL (1,000 mLs Intravenous New Bag/Given 12/08/19 2112)  ondansetron (ZOFRAN) injection 4 mg (4 mg Intravenous Given 12/08/19 2110)  acetaminophen (TYLENOL) tablet 650 mg (650 mg Oral Given 12/08/19 2144)  cefTRIAXone (ROCEPHIN) 2 g in sodium chloride 0.9 % 100 mL IVPB (0 g Intravenous Stopped 12/08/19 2222)  azithromycin (ZITHROMAX) tablet  500 mg (500 mg Oral Given 12/08/19 2144)  dexamethasone (DECADRON) injection 10 mg (10 mg Intravenous Given 12/08/19 2144)     ED Discharge Orders         Ordered    azithromycin (ZITHROMAX Z-PAK) 250 MG tablet     12/08/19 2228    ondansetron (ZOFRAN ODT) 4 MG disintegrating tablet  Every 8 hours PRN     12/08/19 2228           Note:  This document was prepared using Dragon voice recognition software and may include unintentional dictation errors.   Duffy Bruce, MD 12/08/19 2242

## 2019-12-08 NOTE — ED Triage Notes (Signed)
Pt ambulatory to triage.  Pt has a cough and tickle in throat for 1 week.  Pt had a covid test yesterday.   No chest pain or sob.  Nonsmoker.  Pt alert  Speech clear.

## 2019-12-11 ENCOUNTER — Inpatient Hospital Stay
Admission: EM | Admit: 2019-12-11 | Discharge: 2019-12-14 | DRG: 177 | Disposition: A | Discharge status: Other Acute Inpatient Hospital | Payer: No Typology Code available for payment source | Attending: Family Medicine | Admitting: Family Medicine

## 2019-12-11 ENCOUNTER — Emergency Department: Payer: No Typology Code available for payment source

## 2019-12-11 ENCOUNTER — Other Ambulatory Visit: Payer: Self-pay

## 2019-12-11 DIAGNOSIS — J9601 Acute respiratory failure with hypoxia: Secondary | ICD-10-CM | POA: Diagnosis present

## 2019-12-11 DIAGNOSIS — E876 Hypokalemia: Secondary | ICD-10-CM | POA: Diagnosis present

## 2019-12-11 DIAGNOSIS — Z79899 Other long term (current) drug therapy: Secondary | ICD-10-CM

## 2019-12-11 DIAGNOSIS — I10 Essential (primary) hypertension: Secondary | ICD-10-CM | POA: Diagnosis present

## 2019-12-11 DIAGNOSIS — D45 Polycythemia vera: Secondary | ICD-10-CM | POA: Diagnosis present

## 2019-12-11 DIAGNOSIS — J1289 Other viral pneumonia: Secondary | ICD-10-CM | POA: Diagnosis present

## 2019-12-11 DIAGNOSIS — U071 COVID-19: Secondary | ICD-10-CM | POA: Diagnosis not present

## 2019-12-11 DIAGNOSIS — Z7982 Long term (current) use of aspirin: Secondary | ICD-10-CM

## 2019-12-11 LAB — CBC WITH DIFFERENTIAL/PLATELET
Abs Immature Granulocytes: 0.22 10*3/uL — ABNORMAL HIGH (ref 0.00–0.07)
Basophils Absolute: 0 10*3/uL (ref 0.0–0.1)
Basophils Relative: 0 %
Eosinophils Absolute: 0 10*3/uL (ref 0.0–0.5)
Eosinophils Relative: 0 %
HCT: 38 % — ABNORMAL LOW (ref 39.0–52.0)
Hemoglobin: 12.2 g/dL — ABNORMAL LOW (ref 13.0–17.0)
Immature Granulocytes: 4 %
Lymphocytes Relative: 3 %
Lymphs Abs: 0.2 10*3/uL — ABNORMAL LOW (ref 0.7–4.0)
MCH: 28.7 pg (ref 26.0–34.0)
MCHC: 32.1 g/dL (ref 30.0–36.0)
MCV: 89.4 fL (ref 80.0–100.0)
Monocytes Absolute: 0.1 10*3/uL (ref 0.1–1.0)
Monocytes Relative: 2 %
Neutro Abs: 5.3 10*3/uL (ref 1.7–7.7)
Neutrophils Relative %: 91 %
Platelets: 250 10*3/uL (ref 150–400)
RBC: 4.25 MIL/uL (ref 4.22–5.81)
RDW: 16.8 % — ABNORMAL HIGH (ref 11.5–15.5)
WBC: 5.9 10*3/uL (ref 4.0–10.5)
nRBC: 0 % (ref 0.0–0.2)

## 2019-12-11 LAB — BASIC METABOLIC PANEL
Anion gap: 13 (ref 5–15)
BUN: 20 mg/dL (ref 8–23)
CO2: 23 mmol/L (ref 22–32)
Calcium: 8 mg/dL — ABNORMAL LOW (ref 8.9–10.3)
Chloride: 102 mmol/L (ref 98–111)
Creatinine, Ser: 1.14 mg/dL (ref 0.61–1.24)
GFR calc Af Amer: >60 mL/min (ref >60)
GFR calc non Af Amer: >60 mL/min (ref >60)
Glucose, Bld: 130 mg/dL — ABNORMAL HIGH (ref 70–99)
Potassium: 3.5 mmol/L (ref 3.5–5.1)
Sodium: 138 mmol/L (ref 135–145)

## 2019-12-11 MED ORDER — SODIUM CHLORIDE 0.9 % IV SOLN: Freq: Once | INTRAVENOUS | Status: AC

## 2019-12-11 NOTE — ED Notes (Signed)
Pt placed on 2L nasal cannula at this time.

## 2019-12-11 NOTE — ED Triage Notes (Signed)
Pt to the er for no improvement with covid. Pt is currently taking chemo pills for cancer tx. Pt actively coughing in triage. Pt taking IBU and tylenol at home and pt continues to have a fever.

## 2019-12-11 NOTE — ED Notes (Signed)
Pt reports fluid diet for "about a week," not having "eaten that much"

## 2019-12-11 NOTE — ED Provider Notes (Signed)
[facility] Emergency Department Provider Note    First MD Initiated Contact with Patient 12/11/19 2320     (approximate)  I have reviewed the triage vital signs and the nursing notes.   HISTORY  Chief Complaint Cough and SOB   HPI Derek [name] is a 63 y.o. male the below listed past medical history on hydroxyurea presents the ER for worsening shortness of breath cough generalized fatigue and malaise.  Patient was just recently diagnosed with Covid.  Does not wear home oxygen.  No history of COPD.  Patient found in triage to be hypoxic to 88% borderline hypotensive.  Has not been on any antibiotics.    Past Medical History:  Diagnosis Date  . Arthritis   . GERD (gastroesophageal reflux disease)   . Gout   . Low serum testosterone level   . Polycythemia vera ([place])   . Polycythemia, secondary 06/08/2015  . Pulmonary nodules   . Thoracic spine fracture (HCC)    compression fracture following a fall   Family History  Problem Relation Age of Onset  . Throat cancer Other        uncle  . Migraines Neg Hx   . Multiple sclerosis Neg Hx   . Neurofibromatosis Neg Hx   . Parkinsonism Neg Hx   . Seizures Neg Hx   . Neuropathy Neg Hx    Past Surgical History:  Procedure Laterality Date  . COLONOSCOPY  2013   Patient Active Problem List   Diagnosis Date Noted  . Arthritis 04/18/2016  . Acid reflux 04/18/2016  . Gout 04/18/2016  . Polycythemia vera ([place]) 04/18/2016  . Polycythemia, secondary 06/08/2015  . Conjunctival hyperemia 09/28/2013  . Cephalalgia 09/28/2013      Prior to Admission medications   Medication Sig Start Date End Date Taking? Authorizing Provider  amLODipine (NORVASC) 5 MG tablet Take 5 mg daily by mouth. 10/12/17   [provider]  aspirin EC 81 MG tablet Take 81 mg daily by mouth.     [provider]  azithromycin (ZITHROMAX Z-PAK) 250 MG tablet Take 2 tablets (500 mg) on  Day 1,  followed by 1 tablet (250  mg) once daily on Days 2 through 5. 12/08/19   Duffy Bruce, MD  Cholecalciferol (VITAMIN D-1000 MAX ST) 1000 units tablet Take 1,000 Units daily by mouth.     [provider]  Esomeprazole Magnesium (NEXIUM PO) Take 1 capsule by mouth daily.    [provider]  fluticasone Asencion Islam) 50 MCG/ACT nasal spray  07/29/14   [provider]  hydroxyurea (HYDREA) 500 MG capsule TAKE 1 TABLET BY MOUTH IN THE AM AND PM MON-FRI AND TAKE 1 TABLET IN THE AM AND 2 TAB IN THE PM SAT-SUN 09/22/19   Cammie Sickle, MD  Multiple Vitamins-Minerals (MULTIVITAMIN ADULT PO) Take 1 tablet by mouth 1 day or 1 dose.    [provider]  ondansetron (ZOFRAN ODT) 4 MG disintegrating tablet Take 1 tablet (4 mg total) by mouth every 8 (eight) hours as needed for nausea or vomiting. 12/08/19   Duffy Bruce, MD    Allergies Penicillin g    Social History Social History   Tobacco Use  . Smoking status: Never Smoker  . Smokeless tobacco: Never Used  Substance Use Topics  . Alcohol use: Yes    Alcohol/week: 0.0 standard drinks    Comment: occasional alcohol use  . Drug use: No    Review of Systems Patient denies headaches, rhinorrhea,  blurry vision, numbness, shortness of breath, chest pain, edema, cough, abdominal pain, nausea, vomiting, diarrhea, dysuria, fevers, rashes or hallucinations unless otherwise stated above in HPI. ____________________________________________   PHYSICAL EXAM:  VITAL SIGNS: Vitals:   12/12/19 0000 12/12/19 0030  BP: 108/70 97/68  Pulse: 87 87  Resp: (!) 21 (!) 24  Temp:    SpO2: 93% 96%    Constitutional: Alert and oriented.  Eyes: Conjunctivae are normal.  Head: Atraumatic. Nose: No congestion/rhinnorhea. Mouth/Throat: Mucous membranes are moist.   Neck: No stridor. Painless ROM.  Cardiovascular: Normal rate, regular rhythm. Grossly normal heart sounds.  Good peripheral circulation. Respiratory: Normal respiratory effort.  No  retractions. Lungs with coarse bibasilar breathsounds. Gastrointestinal: Soft and nontender. No distention. No abdominal bruits. No CVA tenderness. Genitourinary:  Musculoskeletal: No lower extremity tenderness nor edema.  No joint effusions. Neurologic:  Normal speech and language. No gross focal neurologic deficits are appreciated. No facial droop Skin:  Skin is warm, dry and intact. No rash noted. Psychiatric: Mood and affect are normal. Speech and behavior are normal.  ____________________________________________   LABS (all labs ordered are listed, but only abnormal results are displayed)  Results for orders placed or performed during the hospital encounter of 12/11/19 (from the past 24 hour(s))  Basic metabolic panel     Status: Abnormal   Collection Time: 12/11/19 10:16 PM  Result Value Ref Range   Sodium 138 135 - 145 mmol/L   Potassium 3.5 3.5 - 5.1 mmol/L   Chloride 102 98 - 111 mmol/L   CO2 23 22 - 32 mmol/L   Glucose, Bld 130 (H) 70 - 99 mg/dL   BUN 20 8 - 23 mg/dL   Creatinine, Ser 1.14 0.61 - 1.24 mg/dL   Calcium 8.0 (L) 8.9 - 10.3 mg/dL   GFR calc non Af Amer >60 >60 mL/min   GFR calc Af Amer >60 >60 mL/min   Anion gap 13 5 - 15  CBC with Differential     Status: Abnormal   Collection Time: 12/11/19 10:16 PM  Result Value Ref Range   WBC 5.9 4.0 - 10.5 K/uL   RBC 4.25 4.22 - 5.81 MIL/uL   Hemoglobin 12.2 (L) 13.0 - 17.0 g/dL   HCT 38.0 (L) 39.0 - 52.0 %   MCV 89.4 80.0 - 100.0 fL   MCH 28.7 26.0 - 34.0 pg   MCHC 32.1 30.0 - 36.0 g/dL   RDW 16.8 (H) 11.5 - 15.5 %   Platelets 250 150 - 400 K/uL   nRBC 0.0 0.0 - 0.2 %   Neutrophils Relative % 91 %   Neutro Abs 5.3 1.7 - 7.7 K/uL   Lymphocytes Relative 3 %   Lymphs Abs 0.2 (L) 0.7 - 4.0 K/uL   Monocytes Relative 2 %   Monocytes Absolute 0.1 0.1 - 1.0 K/uL   Eosinophils Relative 0 %   Eosinophils Absolute 0.0 0.0 - 0.5 K/uL   Basophils Relative 0 %   Basophils Absolute 0.0 0.0 - 0.1 K/uL   Immature  Granulocytes 4 %   Abs Immature Granulocytes 0.22 (H) 0.00 - 0.07 K/uL  Lactic acid, plasma     Status: None   Collection Time: 12/11/19 11:48 PM  Result Value Ref Range   Lactic Acid, Venous 1.2 0.5 - 1.9 mmol/L  Comprehensive metabolic panel     Status: Abnormal   Collection Time: 12/11/19 11:48 PM  Result Value Ref Range   Sodium 137 135 - 145 mmol/L   Potassium 3.4 (  L) 3.5 - 5.1 mmol/L   Chloride 101 98 - 111 mmol/L   CO2 23 22 - 32 mmol/L   Glucose, Bld 130 (H) 70 - 99 mg/dL   BUN 22 8 - 23 mg/dL   Creatinine, Ser 1.18 0.61 - 1.24 mg/dL   Calcium 7.8 (L) 8.9 - 10.3 mg/dL   Total Protein 6.3 (L) 6.5 - 8.1 g/dL   Albumin 3.0 (L) 3.5 - 5.0 g/dL   AST 27 15 - 41 U/L   ALT 22 0 - 44 U/L   Alkaline Phosphatase 60 38 - 126 U/L   Total Bilirubin 1.1 0.3 - 1.2 mg/dL   GFR calc non Af Amer >60 >60 mL/min   GFR calc Af Amer >60 >60 mL/min   Anion gap 13 5 - 15  Fibrin derivatives D-Dimer     Status: Abnormal   Collection Time: 12/11/19 11:48 PM  Result Value Ref Range   Fibrin derivatives D-dimer ([facility]) 708.60 (H) 0.00 - 499.00 ng/mL (FEU)  Procalcitonin     Status: None   Collection Time: 12/11/19 11:48 PM  Result Value Ref Range   Procalcitonin 2.46 ng/mL  Lactate dehydrogenase     Status: Abnormal   Collection Time: 12/11/19 11:48 PM  Result Value Ref Range   LDH 300 (H) 98 - 192 U/L  Ferritin     Status: None   Collection Time: 12/11/19 11:48 PM  Result Value Ref Range   Ferritin 209 24 - 336 ng/mL  Triglycerides     Status: None   Collection Time: 12/11/19 11:48 PM  Result Value Ref Range   Triglycerides 68 <150 mg/dL  Fibrinogen     Status: Abnormal   Collection Time: 12/11/19 11:48 PM  Result Value Ref Range   Fibrinogen 668 (H) 210 - 475 mg/dL  Brain natriuretic peptide     Status: None   Collection Time: 12/11/19 11:48 PM  Result Value Ref Range   B Natriuretic Peptide 75.0 0.0 - 100.0 pg/mL  Troponin I (High Sensitivity)     Status: None   Collection Time:  12/11/19 11:48 PM  Result Value Ref Range   Troponin I (High Sensitivity) 9 <18 ng/L   ____________________________________________  EKG My review and personal interpretation at Time: 1:14   Indication: sob  Rate: 90  Rhythm: sinus Axis: normal Other: normal intervals, no stemi ____________________________________________  RADIOLOGY  I personally reviewed all radiographic images ordered to evaluate for the above acute complaints and reviewed radiology reports and findings.  These findings were personally discussed with the patient.  Please see medical record for radiology report.  ____________________________________________   PROCEDURES  Procedure(s) performed:  .Critical Care Performed by: Merlyn Lot, MD Authorized by: Merlyn Lot, MD   Critical care provider statement:    Critical care time (minutes):  30   Critical care time was exclusive of:  Separately billable procedures and treating other patients   Critical care was necessary to treat or prevent imminent or life-threatening deterioration of the following conditions:  Respiratory failure   Critical care was time spent personally by me on the following activities:  Development of treatment plan with patient or surrogate, discussions with consultants, evaluation of patient's response to treatment, examination of patient, obtaining history from patient or surrogate, ordering and performing treatments and interventions, ordering and review of laboratory studies, ordering and review of radiographic studies, pulse oximetry, re-evaluation of patient's condition and review of old charts      Critical Care performed: yes ____________________________________________  INITIAL IMPRESSION / ASSESSMENT AND PLAN / ED COURSE  Pertinent labs & imaging results that were available during my care of the patient were reviewed by me and considered in my medical decision making (see chart for details).   DDX: covid 19, pna,  chf, pe, sepsis, anemia  Panav Berst is a 63 y.o. who presents to the ED with acute respiratory failure with hypoxic in the setting of recent COVID-19.  Patient complicated by his comorbidities.  He is requiring supplemental oxygen.  Will give Decadron as well as IV remdesivir.  Will give albuterol.  Will order CT angiogram to exclude PE as he is high risk.  Will discuss with hospitalist as he will require hospitalization for treatment of his acute hypoxic respriatory failure.  Have discussed with the patient and available family all diagnostics and treatments performed thus far and all questions were answered to the best of my ability. The patient demonstrates understanding and agreement with plan.      The patient was evaluated in Emergency Department today for the symptoms described in the history of present illness. He/she was evaluated in the context of the global COVID-19 pandemic, which necessitated consideration that the patient might be at risk for infection with the SARS-CoV-2 virus that causes COVID-19. Institutional protocols and algorithms that pertain to the evaluation of patients at risk for COVID-19 are in a state of rapid change based on information released by regulatory bodies including the CDC and federal and state organizations. These policies and algorithms were followed during the patient's care in the ED.  As part of my medical decision making, I reviewed the following data within the [place] notes reviewed and incorporated, Labs reviewed, notes from prior ED visits and [place] Controlled Substance Database   ____________________________________________   FINAL CLINICAL IMPRESSION(S) / ED DIAGNOSES  Final diagnoses:  Acute respiratory failure with hypoxia ([place])  COVID-19 virus infection      NEW MEDICATIONS STARTED DURING THIS VISIT:  New Prescriptions   No medications on file     Note:  This document was prepared using Dragon voice  recognition software and may include unintentional dictation errors.    Merlyn Lot, MD 12/12/19 [phone]

## 2019-12-12 ENCOUNTER — Other Ambulatory Visit: Payer: Self-pay

## 2019-12-12 ENCOUNTER — Encounter: Payer: Self-pay | Admitting: Internal Medicine

## 2019-12-12 ENCOUNTER — Inpatient Hospital Stay: Payer: No Typology Code available for payment source

## 2019-12-12 DIAGNOSIS — J9601 Acute respiratory failure with hypoxia: Secondary | ICD-10-CM | POA: Diagnosis present

## 2019-12-12 DIAGNOSIS — E876 Hypokalemia: Secondary | ICD-10-CM | POA: Diagnosis present

## 2019-12-12 DIAGNOSIS — Z79899 Other long term (current) drug therapy: Secondary | ICD-10-CM | POA: Diagnosis not present

## 2019-12-12 DIAGNOSIS — J1289 Other viral pneumonia: Secondary | ICD-10-CM

## 2019-12-12 DIAGNOSIS — D45 Polycythemia vera: Secondary | ICD-10-CM

## 2019-12-12 DIAGNOSIS — Z7982 Long term (current) use of aspirin: Secondary | ICD-10-CM | POA: Diagnosis not present

## 2019-12-12 DIAGNOSIS — U071 COVID-19: Secondary | ICD-10-CM | POA: Diagnosis present

## 2019-12-12 DIAGNOSIS — I10 Essential (primary) hypertension: Secondary | ICD-10-CM | POA: Diagnosis present

## 2019-12-12 DIAGNOSIS — J1282 Pneumonia due to coronavirus disease 2019: Secondary | ICD-10-CM | POA: Diagnosis present

## 2019-12-12 LAB — COMPREHENSIVE METABOLIC PANEL
ALT: 20 U/L (ref 0–44)
ALT: 22 U/L (ref 0–44)
AST: 27 U/L (ref 15–41)
AST: 28 U/L (ref 15–41)
Albumin: 2.8 g/dL — ABNORMAL LOW (ref 3.5–5.0)
Albumin: 3 g/dL — ABNORMAL LOW (ref 3.5–5.0)
Alkaline Phosphatase: 53 U/L (ref 38–126)
Alkaline Phosphatase: 60 U/L (ref 38–126)
Anion gap: 10 (ref 5–15)
Anion gap: 13 (ref 5–15)
BUN: 20 mg/dL (ref 8–23)
BUN: 22 mg/dL (ref 8–23)
CO2: 22 mmol/L (ref 22–32)
CO2: 23 mmol/L (ref 22–32)
Calcium: 7.5 mg/dL — ABNORMAL LOW (ref 8.9–10.3)
Calcium: 7.8 mg/dL — ABNORMAL LOW (ref 8.9–10.3)
Chloride: 101 mmol/L (ref 98–111)
Chloride: 105 mmol/L (ref 98–111)
Creatinine, Ser: 0.96 mg/dL (ref 0.61–1.24)
Creatinine, Ser: 1.18 mg/dL (ref 0.61–1.24)
GFR calc Af Amer: >60 mL/min (ref >60)
GFR calc Af Amer: >60 mL/min (ref >60)
GFR calc non Af Amer: >60 mL/min (ref >60)
GFR calc non Af Amer: >60 mL/min (ref >60)
Glucose, Bld: 130 mg/dL — ABNORMAL HIGH (ref 70–99)
Glucose, Bld: 151 mg/dL — ABNORMAL HIGH (ref 70–99)
Potassium: 3.4 mmol/L — ABNORMAL LOW (ref 3.5–5.1)
Potassium: 3.6 mmol/L (ref 3.5–5.1)
Sodium: 137 mmol/L (ref 135–145)
Sodium: 137 mmol/L (ref 135–145)
Total Bilirubin: 1.1 mg/dL (ref 0.3–1.2)
Total Bilirubin: 1.1 mg/dL (ref 0.3–1.2)
Total Protein: 5.8 g/dL — ABNORMAL LOW (ref 6.5–8.1)
Total Protein: 6.3 g/dL — ABNORMAL LOW (ref 6.5–8.1)

## 2019-12-12 LAB — VITAMIN D 25 HYDROXY (VIT D DEFICIENCY, FRACTURES): Vit D, 25-Hydroxy: 36.81 ng/mL (ref 30–100)

## 2019-12-12 LAB — FIBRIN DERIVATIVES D-DIMER (ARMC ONLY)
Fibrin derivatives D-dimer (ARMC): 708.6 ng/mL (FEU) — ABNORMAL HIGH (ref 0.00–499.00)
Fibrin derivatives D-dimer (ARMC): 738.4 ng/mL (FEU) — ABNORMAL HIGH (ref 0.00–499.00)

## 2019-12-12 LAB — LACTATE DEHYDROGENASE
LDH: 300 U/L — ABNORMAL HIGH (ref 98–192)
LDH: 320 U/L — ABNORMAL HIGH (ref 98–192)

## 2019-12-12 LAB — FERRITIN
Ferritin: 192 ng/mL (ref 24–336)
Ferritin: 209 ng/mL (ref 24–336)

## 2019-12-12 LAB — CBC WITH DIFFERENTIAL/PLATELET
Abs Immature Granulocytes: 0.03 10*3/uL (ref 0.00–0.07)
Basophils Absolute: 0 10*3/uL (ref 0.0–0.1)
Basophils Relative: 0 %
Eosinophils Absolute: 0 10*3/uL (ref 0.0–0.5)
Eosinophils Relative: 0 %
HCT: 35.2 % — ABNORMAL LOW (ref 39.0–52.0)
Hemoglobin: 11 g/dL — ABNORMAL LOW (ref 13.0–17.0)
Immature Granulocytes: 1 %
Lymphocytes Relative: 5 %
Lymphs Abs: 0.1 10*3/uL — ABNORMAL LOW (ref 0.7–4.0)
MCH: 29.5 pg (ref 26.0–34.0)
MCHC: 31.3 g/dL (ref 30.0–36.0)
MCV: 94.4 fL (ref 80.0–100.0)
Monocytes Absolute: 0.1 10*3/uL (ref 0.1–1.0)
Monocytes Relative: 2 %
Neutro Abs: 2.7 10*3/uL (ref 1.7–7.7)
Neutrophils Relative %: 92 %
Platelets: 212 10*3/uL (ref 150–400)
RBC: 3.73 MIL/uL — ABNORMAL LOW (ref 4.22–5.81)
RDW: 16.7 % — ABNORMAL HIGH (ref 11.5–15.5)
WBC: 2.9 10*3/uL — ABNORMAL LOW (ref 4.0–10.5)
nRBC: 0 % (ref 0.0–0.2)

## 2019-12-12 LAB — C-REACTIVE PROTEIN
CRP: 16.3 mg/dL — ABNORMAL HIGH (ref <1.0)
CRP: 16.4 mg/dL — ABNORMAL HIGH (ref <1.0)

## 2019-12-12 LAB — TROPONIN I (HIGH SENSITIVITY)
Troponin I (High Sensitivity): 9 ng/L (ref <18)
Troponin I (High Sensitivity): 6 ng/L (ref <18)

## 2019-12-12 LAB — MAGNESIUM: Magnesium: 2.1 mg/dL (ref 1.7–2.4)

## 2019-12-12 LAB — BRAIN NATRIURETIC PEPTIDE: B Natriuretic Peptide: 75 pg/mL (ref 0.0–100.0)

## 2019-12-12 LAB — LACTIC ACID, PLASMA: Lactic Acid, Venous: 1.2 mmol/L (ref 0.5–1.9)

## 2019-12-12 LAB — PROCALCITONIN
Procalcitonin: 2.46 ng/mL
Procalcitonin: 1.97 ng/mL

## 2019-12-12 LAB — FIBRINOGEN
Fibrinogen: 655 mg/dL — ABNORMAL HIGH (ref 210–475)
Fibrinogen: 668 mg/dL — ABNORMAL HIGH (ref 210–475)

## 2019-12-12 LAB — TSH: TSH: 0.822 u[IU]/mL (ref 0.350–4.500)

## 2019-12-12 LAB — PHOSPHORUS: Phosphorus: 2.7 mg/dL (ref 2.5–4.6)

## 2019-12-12 LAB — TRIGLYCERIDES: Triglycerides: 68 mg/dL (ref <150)

## 2019-12-12 MED ORDER — DEXAMETHASONE SODIUM PHOSPHATE 10 MG/ML IJ SOLN
6.0000 mg | Freq: Every day | INTRAMUSCULAR | Status: DC
  Administered 2019-12-12 – 2019-12-13 (×2): 6 mg via INTRAVENOUS
  Filled 2019-12-12: qty 1
  Filled 2019-12-12 (×2): qty 0.6

## 2019-12-12 MED ORDER — ONDANSETRON HCL 4 MG/2ML IJ SOLN: 4.0000 mg | Freq: Four times a day (QID) | INTRAMUSCULAR | Status: DC | PRN

## 2019-12-12 MED ORDER — SODIUM CHLORIDE 0.9 % IV SOLN
200.0000 mg | Freq: Once | INTRAVENOUS | Status: AC
  Administered 2019-12-12: 200 mg via INTRAVENOUS
  Filled 2019-12-12: qty 40

## 2019-12-12 MED ORDER — SODIUM CHLORIDE 0.9 % IV SOLN: INTRAVENOUS | Status: AC

## 2019-12-12 MED ORDER — POTASSIUM CHLORIDE CRYS ER 20 MEQ PO TBCR
40.0000 meq | EXTENDED_RELEASE_TABLET | Freq: Once | ORAL | Status: AC
  Administered 2019-12-12: 40 meq via ORAL
  Filled 2019-12-12: qty 2

## 2019-12-12 MED ORDER — ONDANSETRON HCL 4 MG PO TABS
4.0000 mg | ORAL_TABLET | Freq: Four times a day (QID) | ORAL | Status: DC | PRN
  Filled 2019-12-12: qty 1

## 2019-12-12 MED ORDER — SODIUM CHLORIDE 0.9 % IV SOLN
100.0000 mg | Freq: Every day | INTRAVENOUS | Status: DC
  Administered 2019-12-13: 100 mg via INTRAVENOUS
  Filled 2019-12-12: qty 100
  Filled 2019-12-12: qty 20

## 2019-12-12 MED ORDER — HYDROCOD POLST-CPM POLST ER 10-8 MG/5ML PO SUER: 5.0000 mL | Freq: Every evening | ORAL | Status: DC | PRN

## 2019-12-12 MED ORDER — ACETAMINOPHEN 325 MG PO TABS: 650.0000 mg | ORAL_TABLET | Freq: Four times a day (QID) | ORAL | Status: DC | PRN

## 2019-12-12 MED ORDER — DEXAMETHASONE SODIUM PHOSPHATE 10 MG/ML IJ SOLN
10.0000 mg | Freq: Once | INTRAMUSCULAR | Status: AC
  Administered 2019-12-12: 10 mg via INTRAVENOUS
  Filled 2019-12-12: qty 1

## 2019-12-12 MED ORDER — HYDROCODONE-ACETAMINOPHEN 5-325 MG PO TABS: 1.0000 | ORAL_TABLET | ORAL | Status: DC | PRN

## 2019-12-12 MED ORDER — HYDROXYUREA 500 MG PO CAPS
500.0000 mg | ORAL_CAPSULE | Freq: Two times a day (BID) | ORAL | Status: DC
  Administered 2019-12-12 – 2019-12-13 (×4): 500 mg via ORAL
  Filled 2019-12-12 (×7): qty 1

## 2019-12-12 MED ORDER — ORAL CARE MOUTH RINSE
15.0000 mL | Freq: Two times a day (BID) | OROMUCOSAL | Status: DC
  Administered 2019-12-13 – 2019-12-14 (×3): 15 mL via OROMUCOSAL

## 2019-12-12 MED ORDER — ENOXAPARIN SODIUM 40 MG/0.4ML ~~LOC~~ SOLN
40.0000 mg | SUBCUTANEOUS | Status: DC
  Administered 2019-12-12 – 2019-12-14 (×3): 40 mg via SUBCUTANEOUS
  Filled 2019-12-12 (×4): qty 0.4

## 2019-12-12 MED ORDER — ACETAMINOPHEN 650 MG RE SUPP: 650.0000 mg | Freq: Four times a day (QID) | RECTAL | Status: DC | PRN

## 2019-12-12 MED ORDER — ASPIRIN EC 81 MG PO TBEC
81.0000 mg | DELAYED_RELEASE_TABLET | Freq: Every day | ORAL | Status: DC
  Administered 2019-12-12 – 2019-12-14 (×3): 81 mg via ORAL
  Filled 2019-12-12 (×3): qty 1

## 2019-12-12 MED ORDER — TOCILIZUMAB 400 MG/20ML IV SOLN
8.0000 mg/kg | Freq: Once | INTRAVENOUS | Status: DC
  Filled 2019-12-12: qty 34.3

## 2019-12-12 MED ORDER — IOHEXOL 350 MG/ML SOLN
75.0000 mL | Freq: Once | INTRAVENOUS | Status: AC | PRN
  Administered 2019-12-12: 75 mL via INTRAVENOUS

## 2019-12-12 MED ORDER — DM-GUAIFENESIN ER 30-600 MG PO TB12
1.0000 | ORAL_TABLET | Freq: Two times a day (BID) | ORAL | Status: DC
  Administered 2019-12-12 – 2019-12-14 (×5): 1 via ORAL
  Filled 2019-12-12 (×10): qty 1

## 2019-12-12 MED ORDER — TOCILIZUMAB 400 MG/20ML IV SOLN
8.0000 mg/kg | Freq: Once | INTRAVENOUS | Status: AC
  Administered 2019-12-12: 686 mg via INTRAVENOUS
  Filled 2019-12-12: qty 34.3

## 2019-12-12 MED ORDER — AMLODIPINE BESYLATE 5 MG PO TABS
5.0000 mg | ORAL_TABLET | Freq: Every day | ORAL | Status: DC
  Administered 2019-12-12 – 2019-12-14 (×3): 5 mg via ORAL
  Filled 2019-12-12 (×3): qty 1

## 2019-12-12 NOTE — Plan of Care (Signed)
  Problem: Clinical Measurements: Goal: Will remain free from infection Outcome: Progressing Goal: Diagnostic test results will improve Outcome: Progressing Goal: Cardiovascular complication will be avoided Outcome: Progressing   Problem: Elimination: Goal: Will not experience complications related to urinary retention Outcome: Progressing   Problem: Pain Managment: Goal: General experience of comfort will improve Outcome: Progressing   Problem: Safety: Goal: Ability to remain free from injury will improve Outcome: Progressing   Problem: Education: Goal: Knowledge of risk factors and measures for prevention of condition will improve Outcome: Progressing

## 2019-12-12 NOTE — ED Notes (Signed)
Report to brandy, rn.  

## 2019-12-12 NOTE — ED Notes (Signed)
Pt up to bedside commode and had a large bm

## 2019-12-12 NOTE — ED Notes (Signed)
Pt departed for CT

## 2019-12-12 NOTE — Progress Notes (Signed)
  PROGRESS NOTE    Derek [name]  [id number] DOB: 1956-05-07 DOA: 12/11/2019  PCP: Dion Body, MD    LOS - 0    Patient was admitted overnight after coming in with complaints of fever, cough, nausea and poor appetite for the past 10 days.  He had apparently been seen in the ER previously on 12/22, tested positive for Covid but was discharged home without need for acute hospital level care.  This visit however patient was hypoxic, requiring 3 L of oxygen to maintain his O2 sat over 90%.  When patient was seen today, he reports feeling slightly better than on admission.  At that time was on 4 L/min oxygen by nasal cannula with O2 sat of 91%.  He appeared no respiratory distress.  Lung sounds are clear but slightly diminished without wheezing or rhonchi.  I have reviewed the full H&P by Dr. Roel Cluck, and I agree with the assessment and plan as outlined therein.      No Charge   Ezekiel Slocumb, DO [facility]   If 7PM-7AM, please contact night-coverage [email or web address] Password [facility] 12/12/2019, 9:51 AM

## 2019-12-12 NOTE — ED Notes (Signed)
Hand hygiene performed - pt given lunch tray, juice, and coke

## 2019-12-12 NOTE — ED Notes (Signed)
ED TO INPATIENT HANDOFF REPORT  ED Nurse Name and Phone #: Olen Cordial [phone]  S Name/Age/Gender Derek [name] 63 y.o. male Room/Bed: ED34A/ED34A  Code Status   Code Status: Full Code  Home/SNF/Other Home Patient oriented to: self, place, time and situation Is this baseline? Yes   Triage Complete: Triage complete  Chief Complaint Pneumonia due to COVID-19 virus [U07.1, J12.89] Acute hypoxemic respiratory failure due to COVID-19 ([place]) [U07.1, J96.01]  Triage Note Pt to the er for no improvement with covid. Pt is currently taking chemo pills for cancer tx. Pt actively coughing in triage. Pt taking IBU and tylenol at home and pt continues to have a fever.    Allergies Allergies  Allergen Reactions  . Penicillin G     Other reaction(s): Localized superficial swelling of skin    Level of Care/Admitting Diagnosis ED Disposition    ED Disposition Condition [facility]: [place] [100120]  Level of Care: Telemetry [5]  Covid Evaluation: Confirmed COVID Positive  Diagnosis: Acute hypoxemic respiratory failure due to COVID-19 [facility]) [id number]  Admitting Physician: Ezekiel Slocumb [id number]  Attending Physician: Ezekiel Slocumb [id number]  Estimated length of stay: past midnight tomorrow  Certification:: I certify this patient will need inpatient services for at least 2 midnights       B Medical/Surgery History Past Medical History:  Diagnosis Date  . Arthritis   . GERD (gastroesophageal reflux disease)   . Gout   . Low serum testosterone level   . Polycythemia vera ([place])   . Polycythemia, secondary 06/08/2015  . Pulmonary nodules   . Thoracic spine fracture (HCC)    compression fracture following a fall   Past Surgical History:  Procedure Laterality Date  . COLONOSCOPY  2013     A IV Location/Drains/Wounds Patient Lines/Drains/Airways Status   Active Line/Drains/Airways    Name:   Placement date:   Placement time:   Site:    Days:   Peripheral IV 12/11/19 Left Hand   12/11/19    2346    Hand   1   Peripheral IV 12/11/19 Right Hand   12/11/19    2352    Hand   1   Peripheral IV 12/12/19 Right Forearm   12/12/19    0539    Forearm   less than 1          Intake/Output Last 24 hours  Intake/Output Summary (Last 24 hours) at 12/12/2019 2010 Last data filed at 12/12/2019 1120 Gross per 24 hour  Intake 2500 ml  Output 1000 ml  Net 1500 ml    Labs/Imaging Results for orders placed or performed during the hospital encounter of 12/11/19 (from the past 48 hour(s))  Basic metabolic panel     Status: Abnormal   Collection Time: 12/11/19 10:16 PM  Result Value Ref Range   Sodium 138 135 - 145 mmol/L   Potassium 3.5 3.5 - 5.1 mmol/L   Chloride 102 98 - 111 mmol/L   CO2 23 22 - 32 mmol/L   Glucose, Bld 130 (H) 70 - 99 mg/dL   BUN 20 8 - 23 mg/dL   Creatinine, Ser 1.14 0.61 - 1.24 mg/dL   Calcium 8.0 (L) 8.9 - 10.3 mg/dL   GFR calc non Af Amer >60 >60 mL/min   GFR calc Af Amer >60 >60 mL/min   Anion gap 13 5 - 15    Comment: Performed at [facility], [address] [place], [place]  [address]  CBC with Differential     Status: Abnormal   Collection Time: 12/11/19 10:16 PM  Result Value Ref Range   WBC 5.9 4.0 - 10.5 K/uL   RBC 4.25 4.22 - 5.81 MIL/uL   Hemoglobin 12.2 (L) 13.0 - 17.0 g/dL   HCT 38.0 (L) 39.0 - 52.0 %   MCV 89.4 80.0 - 100.0 fL   MCH 28.7 26.0 - 34.0 pg   MCHC 32.1 30.0 - 36.0 g/dL   RDW 16.8 (H) 11.5 - 15.5 %   Platelets 250 150 - 400 K/uL   nRBC 0.0 0.0 - 0.2 %   Neutrophils Relative % 91 %   Neutro Abs 5.3 1.7 - 7.7 K/uL   Lymphocytes Relative 3 %   Lymphs Abs 0.2 (L) 0.7 - 4.0 K/uL   Monocytes Relative 2 %   Monocytes Absolute 0.1 0.1 - 1.0 K/uL   Eosinophils Relative 0 %   Eosinophils Absolute 0.0 0.0 - 0.5 K/uL   Basophils Relative 0 %   Basophils Absolute 0.0 0.0 - 0.1 K/uL   Immature Granulocytes 4 %   Abs Immature Granulocytes 0.22 (H) 0.00 - 0.07 K/uL     Comment: Performed at [facility], [address] [place], [place] [address]  Lactic acid, plasma     Status: None   Collection Time: 12/11/19 11:48 PM  Result Value Ref Range   Lactic Acid, Venous 1.2 0.5 - 1.9 mmol/L    Comment: Performed at [facility], [address] [place], [place] [address]  Comprehensive metabolic panel     Status: Abnormal   Collection Time: 12/11/19 11:48 PM  Result Value Ref Range   Sodium 137 135 - 145 mmol/L   Potassium 3.4 (L) 3.5 - 5.1 mmol/L   Chloride 101 98 - 111 mmol/L   CO2 23 22 - 32 mmol/L   Glucose, Bld 130 (H) 70 - 99 mg/dL   BUN 22 8 - 23 mg/dL   Creatinine, Ser 1.18 0.61 - 1.24 mg/dL   Calcium 7.8 (L) 8.9 - 10.3 mg/dL   Total Protein 6.3 (L) 6.5 - 8.1 g/dL   Albumin 3.0 (L) 3.5 - 5.0 g/dL   AST 27 15 - 41 U/L   ALT 22 0 - 44 U/L   Alkaline Phosphatase 60 38 - 126 U/L   Total Bilirubin 1.1 0.3 - 1.2 mg/dL   GFR calc non Af Amer >60 >60 mL/min   GFR calc Af Amer >60 >60 mL/min   Anion gap 13 5 - 15    Comment: Performed at [facility], [address] [place], [place] [address]  Fibrin derivatives D-Dimer     Status: Abnormal   Collection Time: 12/11/19 11:48 PM  Result Value Ref Range   Fibrin derivatives D-dimer ([facility]) 708.60 (H) 0.00 - 499.00 ng/mL (FEU)    Comment: (NOTE) <> Exclusion of Venous Thromboembolism (VTE) - OUTPATIENT ONLY   (Emergency Department or [place])   0-499 ng/ml (FEU): With a low to intermediate pretest probability                      for VTE this test result excludes the diagnosis                      of VTE.   >499 ng/ml (FEU) : VTE not excluded; additional work up for VTE is  required. <> Testing on Inpatients and Evaluation of Disseminated Intravascular   Coagulation (DIC) Reference Range:   0-499 ng/ml (FEU) Performed at [facility], [address] [place], [place] [address]   Procalcitonin     Status: None   Collection Time: 12/11/19  11:48 PM  Result Value Ref Range   Procalcitonin 2.46 ng/mL    Comment:        Interpretation: PCT > 2 ng/mL: Systemic infection (sepsis) is likely, unless other causes are known. (NOTE)       Sepsis PCT Algorithm           Lower Respiratory Tract                                      Infection PCT Algorithm    ----------------------------     ----------------------------         PCT < 0.25 ng/mL                PCT < 0.10 ng/mL         Strongly encourage             Strongly discourage   discontinuation of antibiotics    initiation of antibiotics    ----------------------------     -----------------------------       PCT 0.25 - 0.50 ng/mL            PCT 0.10 - 0.25 ng/mL               OR       >80% decrease in PCT            Discourage initiation of                                            antibiotics      Encourage discontinuation           of antibiotics    ----------------------------     -----------------------------         PCT >= 0.50 ng/mL              PCT 0.26 - 0.50 ng/mL               AND       <80% decrease in PCT              Encourage initiation of                                             antibiotics       Encourage continuation           of antibiotics    ----------------------------     -----------------------------        PCT >= 0.50 ng/mL                  PCT > 0.50 ng/mL               AND         increase in PCT                  Strongly encourage  initiation of antibiotics    Strongly encourage escalation           of antibiotics                                     -----------------------------                                           PCT <= 0.25 ng/mL                                                 OR                                        > 80% decrease in PCT                                     Discontinue / Do not initiate                                             antibiotics Performed at [facility], [address] [place], [place] [address]   Lactate dehydrogenase     Status: Abnormal   Collection Time: 12/11/19 11:48 PM  Result Value Ref Range   LDH 300 (H) 98 - 192 U/L    Comment: Performed at [facility], [address] [place], [place] [address]  Ferritin     Status: None   Collection Time: 12/11/19 11:48 PM  Result Value Ref Range   Ferritin 209 24 - 336 ng/mL    Comment: Performed at [facility], [address] [place], [place] [address]  Triglycerides     Status: None   Collection Time: 12/11/19 11:48 PM  Result Value Ref Range   Triglycerides 68 <150 mg/dL    Comment: Performed at [facility], [address] [place], [place] [address]  Fibrinogen     Status: Abnormal   Collection Time: 12/11/19 11:48 PM  Result Value Ref Range   Fibrinogen 668 (H) 210 - 475 mg/dL    Comment: Performed at [facility], [address] [place], [place] [address]  C-reactive protein     Status: Abnormal   Collection Time: 12/11/19 11:48 PM  Result Value Ref Range   CRP 16.3 (H) <1.0 mg/dL    Comment: Performed at [facility], [address] [place], [place] [address]  Brain natriuretic peptide     Status: None   Collection Time: 12/11/19 11:48 PM  Result Value Ref Range   B Natriuretic Peptide 75.0 0.0 - 100.0 pg/mL    Comment: Performed at [facility], [address] [place], [place] [address]  Troponin I (High Sensitivity)     Status: None   Collection Time: 12/11/19 11:48 PM  Result Value Ref Range   Troponin I (High Sensitivity) 9 <18 ng/L  Comment: (NOTE) Elevated high sensitivity troponin I (hsTnI) values and significant  changes across serial measurements may suggest ACS but many other  chronic and acute conditions are known to elevate hsTnI results.  Refer to the "Links" section for chest pain algorithms and additional  guidance. Performed at [facility], [address]  [place], [place] [address]   Blood Culture (routine x 2)     Status: None (Preliminary result)   Collection Time: 12/11/19 11:49 PM   Specimen: BLOOD LEFT HAND  Result Value Ref Range   Specimen Description BLOOD LEFT HAND    Special Requests      BOTTLES DRAWN AEROBIC AND ANAEROBIC Blood Culture adequate volume   Culture      NO GROWTH < 12 HOURS Performed at [facility], [address] [place], [place] [address]    Report Status PENDING   Blood Culture (routine x 2)     Status: None (Preliminary result)   Collection Time: 12/11/19 11:49 PM   Specimen: BLOOD RIGHT HAND  Result Value Ref Range   Specimen Description BLOOD RIGHT HAND    Special Requests      BOTTLES DRAWN AEROBIC AND ANAEROBIC Blood Culture adequate volume   Culture      NO GROWTH < 12 HOURS Performed at [facility], [address] [place], [place] [address]    Report Status PENDING   Comprehensive metabolic panel     Status: Abnormal   Collection Time: 12/12/19  3:13 AM  Result Value Ref Range   Sodium 137 135 - 145 mmol/L   Potassium 3.6 3.5 - 5.1 mmol/L   Chloride 105 98 - 111 mmol/L   CO2 22 22 - 32 mmol/L   Glucose, Bld 151 (H) 70 - 99 mg/dL   BUN 20 8 - 23 mg/dL   Creatinine, Ser 0.96 0.61 - 1.24 mg/dL   Calcium 7.5 (L) 8.9 - 10.3 mg/dL   Total Protein 5.8 (L) 6.5 - 8.1 g/dL   Albumin 2.8 (L) 3.5 - 5.0 g/dL   AST 28 15 - 41 U/L   ALT 20 0 - 44 U/L   Alkaline Phosphatase 53 38 - 126 U/L   Total Bilirubin 1.1 0.3 - 1.2 mg/dL   GFR calc non Af Amer >60 >60 mL/min   GFR calc Af Amer >60 >60 mL/min   Anion gap 10 5 - 15    Comment: Performed at [facility] [address], [place], [place] [address]  Ferritin     Status: None   Collection Time: 12/12/19  3:13 AM  Result Value Ref Range   Ferritin 192 24 - 336 ng/mL    Comment: Performed at [facility], [address] [place], [place] [address]  Lactate dehydrogenase     Status: Abnormal   Collection Time:  12/12/19  3:13 AM  Result Value Ref Range   LDH 320 (H) 98 - 192 U/L    Comment: Performed at [facility], [address] [place], [place] [address]  Procalcitonin     Status: None   Collection Time: 12/12/19  3:13 AM  Result Value Ref Range   Procalcitonin 1.97 ng/mL    Comment:        Interpretation: PCT > 0.5 ng/mL and <= 2 ng/mL: Systemic infection (sepsis) is possible, but other conditions are known to elevate PCT as well. (NOTE)       Sepsis PCT Algorithm  Lower Respiratory Tract                                      Infection PCT Algorithm    ----------------------------     ----------------------------         PCT < 0.25 ng/mL                PCT < 0.10 ng/mL         Strongly encourage             Strongly discourage   discontinuation of antibiotics    initiation of antibiotics    ----------------------------     -----------------------------       PCT 0.25 - 0.50 ng/mL            PCT 0.10 - 0.25 ng/mL               OR       >80% decrease in PCT            Discourage initiation of                                            antibiotics      Encourage discontinuation           of antibiotics    ----------------------------     -----------------------------         PCT >= 0.50 ng/mL              PCT 0.26 - 0.50 ng/mL                AND       <80% decrease in PCT             Encourage initiation of                                             antibiotics       Encourage continuation           of antibiotics    ----------------------------     -----------------------------        PCT >= 0.50 ng/mL                  PCT > 0.50 ng/mL               AND         increase in PCT                  Strongly encourage                                      initiation of antibiotics    Strongly encourage escalation           of antibiotics                                     -----------------------------  PCT <= 0.25 ng/mL                                                  OR                                        > 80% decrease in PCT                                     Discontinue / Do not initiate                                             antibiotics Performed at [facility], [place], [place] [address]   Troponin I (High Sensitivity)     Status: None   Collection Time: 12/12/19  3:13 AM  Result Value Ref Range   Troponin I (High Sensitivity) 6 <18 ng/L    Comment: (NOTE) Elevated high sensitivity troponin I (hsTnI) values and significant  changes across serial measurements may suggest ACS but many other  chronic and acute conditions are known to elevate hsTnI results.  Refer to the "Links" section for chest pain algorithms and additional  guidance. Performed at [facility], [address] [place], [place] [address]   CBC with Differential/Platelet     Status: Abnormal   Collection Time: 12/12/19  5:07 AM  Result Value Ref Range   WBC 2.9 (L) 4.0 - 10.5 K/uL   RBC 3.73 (L) 4.22 - 5.81 MIL/uL   Hemoglobin 11.0 (L) 13.0 - 17.0 g/dL   HCT 35.2 (L) 39.0 - 52.0 %   MCV 94.4 80.0 - 100.0 fL   MCH 29.5 26.0 - 34.0 pg   MCHC 31.3 30.0 - 36.0 g/dL   RDW 16.7 (H) 11.5 - 15.5 %   Platelets 212 150 - 400 K/uL   nRBC 0.0 0.0 - 0.2 %   Neutrophils Relative % 92 %   Neutro Abs 2.7 1.7 - 7.7 K/uL   Lymphocytes Relative 5 %   Lymphs Abs 0.1 (L) 0.7 - 4.0 K/uL   Monocytes Relative 2 %   Monocytes Absolute 0.1 0.1 - 1.0 K/uL   Eosinophils Relative 0 %   Eosinophils Absolute 0.0 0.0 - 0.5 K/uL   Basophils Relative 0 %   Basophils Absolute 0.0 0.0 - 0.1 K/uL   Immature Granulocytes 1 %   Abs Immature Granulocytes 0.03 0.00 - 0.07 K/uL    Comment: Performed at [facility], [address] [place], [place] [address]  VITAMIN D 25 Hydroxy (Vit-D Deficiency, Fractures)     Status: None   Collection Time: 12/12/19  5:07 AM  Result Value Ref Range   Vit D,  25-Hydroxy 36.81 30 - 100 ng/mL    Comment: (NOTE) Vitamin D deficiency has been defined by the Institute of Medicine  and an Endocrine Society practice guideline as a level of serum 25-OH  vitamin D less than 20 ng/mL (1,2). The Endocrine Society went on to  further define vitamin D insufficiency as a level  between 21 and 29  ng/mL (2). 1. IOM (Institute of Medicine). 2010. Dietary reference intakes for  calcium and D. [place]: The [employer]. 2. Holick MF, Binkley Burdett, Bischoff-Ferrari HA, et al. Evaluation,  treatment, and prevention of vitamin D deficiency: an Endocrine  Society clinical practice guideline, JCEM. 2011 Jul; 96(7): 1911-30. Performed at [facility], [place] [address], [place], [place] [address]   Magnesium     Status: None   Collection Time: 12/12/19  5:07 AM  Result Value Ref Range   Magnesium 2.1 1.7 - 2.4 mg/dL    Comment: Performed at [facility], [address] [place], [place] [address]  Phosphorus     Status: None   Collection Time: 12/12/19  5:07 AM  Result Value Ref Range   Phosphorus 2.7 2.5 - 4.6 mg/dL    Comment: Performed at [facility], [address] [place], [place] [address]  TSH     Status: None   Collection Time: 12/12/19  5:07 AM  Result Value Ref Range   TSH 0.822 0.350 - 4.500 uIU/mL    Comment: Performed by a 3rd Generation assay with a functional sensitivity of <=0.01 uIU/mL. Performed at [facility], [address] [place], [place] [address]   C-reactive protein     Status: Abnormal   Collection Time: 12/12/19  5:07 AM  Result Value Ref Range   CRP 16.4 (H) <1.0 mg/dL    Comment: Performed at [facility], [address], [place], [place] [address]  Fibrin derivatives D-Dimer [facility] only)     Status: Abnormal   Collection Time: 12/12/19  6:25 AM  Result Value Ref Range   Fibrin derivatives D-dimer ([facility]) 738.40 (H) 0.00 - 499.00 ng/mL (FEU)    Comment: (NOTE) <>  Exclusion of Venous Thromboembolism (VTE) - OUTPATIENT ONLY   (Emergency Department or [place])   0-499 ng/ml (FEU): With a low to intermediate pretest probability                      for VTE this test result excludes the diagnosis                      of VTE.   >499 ng/ml (FEU) : VTE not excluded; additional work up for VTE is                      required. <> Testing on Inpatients and Evaluation of Disseminated Intravascular   Coagulation (DIC) Reference Range:   0-499 ng/ml (FEU) Performed at [facility], [address] [place], [place] [address]   Fibrinogen     Status: Abnormal   Collection Time: 12/12/19  6:25 AM  Result Value Ref Range   Fibrinogen 655 (H) 210 - 475 mg/dL    Comment: Performed at [facility], [address] [place], [place] [address]   CT Angio Chest PE W and/or Wo Contrast  Result Date: 12/12/2019 CLINICAL DATA:  Shortness of breath. COVID-19 positive without improvement. Cough and fever. Patient on oral chemotherapy for cancer. EXAM: CT ANGIOGRAPHY CHEST WITH CONTRAST TECHNIQUE: Multidetector CT imaging of the chest was performed using the standard protocol during bolus administration of intravenous contrast. Multiplanar CT image reconstructions and MIPs were obtained to evaluate the vascular anatomy. CONTRAST:  36mL OMNIPAQUE IOHEXOL 350 MG/ML SOLN COMPARISON:  03/18/2015 FINDINGS: Cardiovascular: Heart is normal size. There is a very small amount of  pericardial fluid present slightly worse. Subtle calcified plaque over the left anterior descending coronary artery. Thoracic aorta is normal in caliber and demonstrates very minimal calcified plaque. Pulmonary arterial system is well opacified without evidence of emboli. Remaining vascular structures are unremarkable. Mediastinum/Nodes: 1 cm right hilar lymph node and 1.2 cm left hilar lymph node. These are likely reactive. No significant mediastinal adenopathy. Remaining mediastinal structures are  unremarkable. Lungs/Pleura: Lungs are adequately inflated demonstrate patchy bilateral peripheral predominant airspace process likely multifocal pneumonia. Tiny amount of bilateral pleural fluid. Airways are normal. Upper Abdomen: Moderate splenomegaly measuring 18.4 cm in greatest diameter. No acute findings. Musculoskeletal: Stable old upper thoracic spine moderate compression fracture. Remainder of the exam is unchanged. Review of the MIP images confirms the above findings. IMPRESSION: 1.  No evidence of pulmonary embolism. 2. Moderate bilateral patchy airspace process peripheral predominant likely multifocal pneumonia. Tiny amount of bilateral pleural fluid. Minimal reactive hilar adenopathy. 3.  Small pericardial effusion slightly worse compared to 2016. 4.  Moderate splenomegaly. 5. Moderate stable chronic compression fracture over the upper thoracic spine. Electronically Signed   By: Marin Olp M.D.   On: 12/12/2019 06:23   DG Chest Port 1 View  Result Date: 12/11/2019 CLINICAL DATA:  COVID-19 EXAM: PORTABLE CHEST 1 VIEW COMPARISON:  12/08/2019 FINDINGS: Worsening bilateral pulmonary opacities. No pleural effusion or pneumothorax. Normal cardiomediastinal contours. IMPRESSION: Worsening bilateral pulmonary opacities, likely indicating worsening multifocal infection. Electronically Signed   By: Ulyses Jarred M.D.   On: 12/11/2019 23:38    Pending Labs Unresulted Labs (From admission, onward)    Start     Ordered   12/13/19 0500  CBC with Differential/Platelet  Daily,   STAT     12/12/19 0034   12/13/19 0500  Comprehensive metabolic panel  Daily,   STAT     12/12/19 0034   12/13/19 0500  C-reactive protein  Daily,   STAT     12/12/19 0034   12/13/19 0500  Fibrin derivatives D-Dimer ([facility] only)  Daily,   STAT     12/12/19 0034   12/13/19 0500  Ferritin  Daily,   STAT     12/12/19 0034   12/13/19 0500  Magnesium  Daily,   STAT     12/12/19 0034   12/13/19 0500  HIV Antibody (routine  testing w rflx)  (HIV Antibody (Routine testing w reflex) panel)  Tomorrow morning,   STAT     12/12/19 0544          Vitals/Pain Today's Vitals   12/12/19 1437 12/12/19 1437 12/12/19 1646 12/12/19 1827  BP:  117/78 120/75 130/77  Pulse:  98 88 96  Resp:  16 16 16   Temp:  97.9 F (36.6 C)  98.5 F (36.9 C)  TempSrc:  Oral    SpO2:  93% 92% 94%  Weight:      Height:      PainSc: 0-No pain  0-No pain 0-No pain    Isolation Precautions Airborne and Contact precautions  Medications Medications  remdesivir 200 mg in sodium chloride 0.9% 250 mL IVPB (0 mg Intravenous Stopped 12/12/19 0131)    Followed by  remdesivir 100 mg in sodium chloride 0.9 % 100 mL IVPB (has no administration in time range)  dexamethasone (DECADRON) injection 6 mg (6 mg Intravenous Given 12/12/19 1820)  hydroxyurea (HYDREA) capsule 500 mg (500 mg Oral Given 12/12/19 1123)  amLODipine (NORVASC) tablet 5 mg (5 mg Oral Given 12/12/19 1123)  acetaminophen (TYLENOL) tablet 650 mg (  has no administration in time range)    Or  acetaminophen (TYLENOL) suppository 650 mg (has no administration in time range)  HYDROcodone-acetaminophen (NORCO/VICODIN) 5-325 MG per tablet 1-2 tablet (has no administration in time range)  ondansetron (ZOFRAN) tablet 4 mg (has no administration in time range)    Or  ondansetron (ZOFRAN) injection 4 mg (has no administration in time range)  enoxaparin (LOVENOX) injection 40 mg (40 mg Subcutaneous Given 12/12/19 1126)  0.9 %  sodium chloride infusion ( Intravenous Stopped 12/12/19 0946)  dextromethorphan-guaiFENesin (MUCINEX DM) 30-600 MG per 12 hr tablet 1 tablet (1 tablet Oral Given 12/12/19 1528)  chlorpheniramine-HYDROcodone (TUSSIONEX) 10-8 MG/5ML suspension 5 mL (has no administration in time range)  0.9 %  sodium chloride infusion ( Intravenous Stopped 12/12/19 0100)  dexamethasone (DECADRON) injection 10 mg (10 mg Intravenous Given 12/12/19 0100)  potassium chloride SA  (KLOR-CON) CR tablet 40 mEq (40 mEq Oral Given 12/12/19 0311)  iohexol (OMNIPAQUE) 350 MG/ML injection 75 mL (75 mLs Intravenous Contrast Given 12/12/19 0558)  tocilizumab (ACTEMRA) 8 mg/kg = 686 mg in sodium chloride 0.9 % 100 mL infusion (0 mg/kg  85.7 kg Intravenous Stopped 12/12/19 1508)    Mobility non-ambulatory Low fall risk   Focused Assessments Cardiac Assessment Handoff:  Cardiac Rhythm: Normal sinus rhythm No results found for: CKTOTAL, CKMB, CKMBINDEX, TROPONINI No results found for: DDIMER Does the Patient currently have chest pain? No   , Pulmonary Assessment Handoff:  Lung sounds: Bilateral Breath Sounds: Diminished O2 Device: Nasal Cannula O2 Flow Rate (L/min): 4 L/min      R Recommendations: See Admitting Provider Note  Report given to:   Additional Notes:

## 2019-12-12 NOTE — ED Notes (Signed)
Messaged admitting provider to see if this pt is suitable for 2A

## 2019-12-12 NOTE — ED Notes (Signed)
Attempted call report, per Sharyn Lull, assigned room is blocked. Will speak to charge

## 2019-12-12 NOTE — ED Notes (Signed)
Admitting provider states that pt can move to 2A - order to be placed

## 2019-12-12 NOTE — ED Notes (Signed)
O2 increased to 3L/min via nasal cannula

## 2019-12-12 NOTE — Progress Notes (Signed)
Remdesivir - Pharmacy Brief Note   O:  ALT: 21 CXR:  SpO2: 93% on [place]   A/P:  Remdesivir 200 mg IVPB once followed by 100 mg IVPB daily x 4 days.   Hart Robinsons, PharmD 12/12/2019 12:18 AM

## 2019-12-12 NOTE — ED Notes (Signed)
hospitalist in to see pt.

## 2019-12-12 NOTE — ED Notes (Signed)
Report given to Yessica RN  

## 2019-12-12 NOTE — H&P (Signed)
Derek [name] [id number] DOB: 1956-05-28 DOA: 12/11/2019     PCP: Dion Body, MD   Outpatient Specialists:   NONE   Patient arrived to ER on 12/11/19 at [facility]  Patient coming from: home Lives  With family (G-Friend)    Chief Complaint:   covid 19 HPI: Derek [name] is a 63 y.o. male with medical history significant of polycythemia Vera on hydroxyurea    Presented with  10 days ago developed fever cough,  nausea, poor appetite.  Was seen in ER 12/22  Tested positive CXR mild infiltrates not on O2 was discharged to home   Presented back today with no improvement Now needing 3L of O2 Reports persistent cough some nausea and decreased p.o. intake no diarrhea.  Worsening shortness of breath  Infectious risk factors:  Reports  fever, shortness of breath  dry cough, N  severe fatigue     KNOWN COVID POSITIVE     No results found for: SARSCOV2NAA   Regarding pertinent Chronic problems:     HTN on Norvasc   While in ER:  Started on Remdesivir and steroids in ER Telemetry marker somewhat elevated Also elevated procalcitonin CT angio ordered but currently pending  The following Work up has been ordered so far:  Orders Placed This Encounter  Procedures  . Critical Care  . Blood Culture (routine x 2)  . DG Chest Port 1 View  . CT Angio Chest PE W and/or Wo Contrast  . Basic metabolic panel  . CBC with Differential  . Lactic acid, plasma  . Comprehensive metabolic panel  . Fibrin derivatives D-Dimer  . Procalcitonin  . Lactate dehydrogenase  . Ferritin  . Triglycerides  . Fibrinogen  . C-reactive protein  . Diet NPO time specified  . If O2 Sat <94% administer O2 at 2 liters/minute via nasal cannula  . Cardiac monitoring  . Insert peripheral IV x 2  . Initiate Carrier Fluid Protocol  . Place surgical mask on patient  . Patient to wear surgical mask during transportation  . Assess patient for ability to self-prone. If able (can move self in bed,  ambulate) and stable (SpO2 and oxygen requirement):  . RN/NT - Document specific oxygen requirements in CHL  . Notify EDP if new oxygen requirements escalates > 4L per minute [place]  . RN to draw the following extra tubes:  . remdesivir per pharmacy consult  . Consult to hospitalist  ALL PATIENTS BEING ADMITTED/HAVING PROCEDURES NEED COVID-19 SCREENING  . Airborne and Contact precautions  . Pulse oximetry, continuous  . ED EKG 12-Lead    Following Medications were ordered in ER: Medications  dexamethasone (DECADRON) injection 10 mg (has no administration in time range)  remdesivir 200 mg in sodium chloride 0.9% 250 mL IVPB (has no administration in time range)    Followed by  remdesivir 100 mg in sodium chloride 0.9 % 100 mL IVPB (has no administration in time range)  0.9 %  sodium chloride infusion ( Intravenous New Bag/Given 12/12/19 0001)        Consult Orders  (From admission, onward)         Start     Ordered   12/12/19 0003  Consult to hospitalist  ALL PATIENTS BEING ADMITTED/HAVING PROCEDURES NEED COVID-19 SCREENING  Once    Comments: ALL PATIENTS BEING ADMITTED/HAVING PROCEDURES NEED COVID-19 SCREENING  Provider:  (Not yet assigned)  Question Answer Comment  Place call to: [phone]   Reason for Consult Admit  12/12/19 0002          Significant initial  Findings: Abnormal Labs Reviewed  BASIC METABOLIC PANEL - Abnormal; Notable for the following components:      Result Value   Glucose, Bld 130 (*)    Calcium 8.0 (*)    All other components within normal limits  CBC WITH DIFFERENTIAL/PLATELET - Abnormal; Notable for the following components:   Hemoglobin 12.2 (*)    HCT 38.0 (*)    RDW 16.8 (*)    Lymphs Abs 0.2 (*)    Abs Immature Granulocytes 0.22 (*)    All other components within normal limits     Otherwise labs showing:    Recent Labs  Lab 12/08/19 1659 12/11/19 2216  NA 136 138  K 3.7 3.5  CO2 25 23  GLUCOSE 130* 130*  BUN 10 20   CREATININE 0.98 1.14  CALCIUM 8.4* 8.0*    Cr   Up from baseline see below Lab Results  Component Value Date   CREATININE 1.14 12/11/2019   CREATININE 0.98 12/08/2019   CREATININE 0.92 11/04/2019    Recent Labs  Lab 12/08/19 1659  AST 35  ALT 21  ALKPHOS 77  BILITOT 1.8*  PROT 7.1  ALBUMIN 3.8   Lab Results  Component Value Date   CALCIUM 8.0 (L) 12/11/2019     WBC      Component Value Date/Time   WBC 5.9 12/11/2019 2216   ANC    Component Value Date/Time   NEUTROABS 5.3 12/11/2019 2216   NEUTROABS 9.3 (H) 04/06/2015 1511   ALC 0.2    Plt: Lab Results  Component Value Date   PLT 250 12/11/2019     Lactic Acid, Venous    Component Value Date/Time   LATICACIDVEN 1.5 12/08/2019 1659    Procalcitonin 2.46   COVID-19 Labs  Recent Labs    12/11/19 2348  FERRITIN 209  LDH 300*    No results found for: SARSCOV2NAA   HG/HCT   Stable,     Component Value Date/Time   HGB 12.2 (L) 12/11/2019 2216   HGB 13.0 04/06/2015 1511   HCT 38.0 (L) 12/11/2019 2216   HCT 42.8 04/06/2015 1511       Troponin 9       ECG: Ordered Personally reviewed by me showing: HR : 91 Rhythm:  NSR,    no evidence of ischemic changes QTC 459   BNP (last 3 results) Recent Labs    12/11/19 2348  BNP 75.0     UA not ordered   Urine analysis: No results found for: COLORURINE, APPEARANCEUR, LABSPEC, PHURINE, GLUCOSEU, HGBUR, BILIRUBINUR, KETONESUR, PROTEINUR, UROBILINOGEN, NITRITE, LEUKOCYTESUR     Ordered    CXR - worsening bilateral pNA   CTA chest - ordered    ED Triage Vitals  Enc Vitals Group     BP 12/11/19 2006 95/64     Pulse Rate 12/11/19 2006 (!) 110     Resp 12/11/19 2006 (!) 24     Temp 12/11/19 2006 98.3 F (36.8 C)     Temp Source 12/11/19 2006 Oral     SpO2 12/11/19 2006 (!) 88 %     Weight 12/11/19 1913 189 lb (85.7 kg)     Height 12/11/19 1913 5\' 9"  (1.753 m)     Head Circumference --      Peak Flow --      Pain Score  12/11/19 1913 5     Pain Loc --  Pain Edu? --      Excl. in [place]? --   TMAX(24)@       Latest  Blood pressure 109/63, pulse 93, temperature 98 F (36.7 C), temperature source Oral, resp. rate 20, height 5\' 9"  (1.753 m), weight 85.7 kg, SpO2 91 %.     Hospitalist was called for admission for  COVID PNA   Review of Systems:    Pertinent positives include:  Fevers, chills, fatigue,  nausea loss of appetite shortness of breath at rest.   dyspnea on exertion, non-productive cough Constitutional:  No weight loss, night sweats weight loss  HEENT:  No headaches, Difficulty swallowing,Tooth/dental problems,Sore throat,  No sneezing, itching, ear ache, nasal congestion, post nasal drip,  Cardio-vascular:  No chest pain, Orthopnea, PND, anasarca, dizziness, palpitations.no Bilateral lower extremity swelling  GI:  No heartburn, indigestion, abdominal pain,, vomiting, diarrhea, change in bowel habits, melena, blood in stool, hematemesis Resp:  no  No excess mucus, no productive cough, No, No coughing up of blood.No change in color of mucus.No wheezing. Skin:  no rash or lesions. No jaundice GU:  no dysuria, change in color of urine, no urgency or frequency. No straining to urinate.  No flank pain.  Musculoskeletal:  No joint pain or no joint swelling. No decreased range of motion. No back pain.  Psych:  No change in mood or affect. No depression or anxiety. No memory loss.  Neuro: no localizing neurological complaints, no tingling, no weakness, no double vision, no gait abnormality, no slurred speech, no confusion  All systems reviewed and apart from [place] all are negative  Past Medical History:   Past Medical History:  Diagnosis Date  . Arthritis   . GERD (gastroesophageal reflux disease)   . Gout   . Low serum testosterone level   . Polycythemia vera ([place])   . Polycythemia, secondary 06/08/2015  . Pulmonary nodules   . Thoracic spine fracture (HCC)    compression fracture  following a fall      Past Surgical History:  Procedure Laterality Date  . COLONOSCOPY  2013    Social History:  Ambulatory   Independently     reports that he has never smoked. He has never used smokeless tobacco. He reports current alcohol use. He reports that he does not use drugs.   Family History:   Family History  Problem Relation Age of Onset  . Throat cancer Other        uncle  . Migraines Neg Hx   . Multiple sclerosis Neg Hx   . Neurofibromatosis Neg Hx   . Parkinsonism Neg Hx   . Seizures Neg Hx   . Neuropathy Neg Hx     Allergies: Allergies  Allergen Reactions  . Penicillin G     Other reaction(s): Localized superficial swelling of skin     Prior to Admission medications   Medication Sig Start Date End Date Taking? Authorizing Provider  amLODipine (NORVASC) 5 MG tablet Take 5 mg daily by mouth. 10/12/17   [provider]  aspirin EC 81 MG tablet Take 81 mg daily by mouth.     [provider]  azithromycin (ZITHROMAX Z-PAK) 250 MG tablet Take 2 tablets (500 mg) on  Day 1,  followed by 1 tablet (250 mg) once daily on Days 2 through 5. 12/08/19   Duffy Bruce, MD  Cholecalciferol (VITAMIN D-1000 MAX ST) 1000 units tablet Take 1,000 Units daily by mouth.     [provider]  Esomeprazole Magnesium (  NEXIUM PO) Take 1 capsule by mouth daily.    [provider]  fluticasone Asencion Islam) 50 MCG/ACT nasal spray  07/29/14   [provider]  hydroxyurea (HYDREA) 500 MG capsule TAKE 1 TABLET BY MOUTH IN THE AM AND PM MON-FRI AND TAKE 1 TABLET IN THE AM AND 2 TAB IN THE PM SAT-SUN 09/22/19   Cammie Sickle, MD  Multiple Vitamins-Minerals (MULTIVITAMIN ADULT PO) Take 1 tablet by mouth 1 day or 1 dose.    [provider]  ondansetron (ZOFRAN ODT) 4 MG disintegrating tablet Take 1 tablet (4 mg total) by mouth every 8 (eight) hours as needed for nausea or vomiting. 12/08/19   Duffy Bruce, MD   Physical  Exam: Blood pressure 109/63, pulse 93, temperature 98 F (36.7 C), temperature source Oral, resp. rate 20, height 5\' 9"  (1.753 m), weight 85.7 kg, SpO2 91 %. 1. General:  in No  Acute distress   acutely ill -appearing 2. Psychological: Alert and    Oriented 3. Head/ENT:    Dry Mucous Membranes                          Head Non traumatic, neck supple                         Poor Dentition 4. SKIN:    decreased Skin turgor,  Skin clean Dry and intact no rash 5. Heart: Regular rate and rhythm no  Murmur, no Rub or gallop 6. Lungs:   no wheezes or crackles   7. Abdomen: Soft,  non-tender, Non distended  bowel sounds present 8. Lower extremities: no clubbing, cyanosis, no edema 9. Neurologically Grossly intact, moving all 4 extremities equally  10. MSK: Normal range of motion   All other LABS:     Recent Labs  Lab 12/08/19 1659 12/11/19 2216  WBC 4.3 5.9  NEUTROABS 3.8 5.3  HGB 13.1 12.2*  HCT 40.8 38.0*  MCV 90.1 89.4  PLT 146* 250     Recent Labs  Lab 12/08/19 1659 12/11/19 2216  NA 136 138  K 3.7 3.5  CL 99 102  CO2 25 23  GLUCOSE 130* 130*  BUN 10 20  CREATININE 0.98 1.14  CALCIUM 8.4* 8.0*     Recent Labs  Lab 12/08/19 1659  AST 35  ALT 21  ALKPHOS 77  BILITOT 1.8*  PROT 7.1  ALBUMIN 3.8       Cultures: No results found for: SDES, [place], CULT, REPTSTATUS   Radiological Exams on Admission: DG Chest Port 1 View  Result Date: 12/11/2019 CLINICAL DATA:  COVID-19 EXAM: PORTABLE CHEST 1 VIEW COMPARISON:  12/08/2019 FINDINGS: Worsening bilateral pulmonary opacities. No pleural effusion or pneumothorax. Normal cardiomediastinal contours. IMPRESSION: Worsening bilateral pulmonary opacities, likely indicating worsening multifocal infection. Electronically Signed   By: Ulyses Jarred M.D.   On: 12/11/2019 23:38    Chart has been reviewed    Assessment/Plan  62 y.o. male with medical history significant of polycythemia Vera on hydroxyurea     Admitted for acute respiratory failure with hypoxia secondary to Covid pneumonia  Present on Admission: . Pneumonia due to COVID-19 virus -   FROM HOME   WITH KNOWN HX OF COVID19 Initial positive test on 12/08/2019      - new severe hypoxia  Following concerning LAB/ imaging findings:  CBC: leukopenia, lymphopenia     ANC/ALC ratio>3.5 BMP: increased BUN/Cr  CRP, LDH: increased  IL-6 and Ferritin increased     CXR: hazy bilateral peripheral opacities    Plan of treatment:   -given Hypoxia and /or infiltrates initiate steroids Decadron 6mg  q 24 hours And pharmacy consult for Remdesivir If CRP >7 and rapidly progressing hypoxia would start Actemra - Will follow daily d.dimer - Assess for ability to prone  - Supportive management -Fluid sparing resuscitation  -Provide oxygen as needed currently on   SpO2: 92 % O2 Flow Rate (L/min): 4 L/min - IF d.dimer elvated >5 will increase dose of lovenox - hold off  antibiotics  Monitor for evidence of superinfection  - Consult PCCM if becomes respiratory unstable    Poor Prognostic factors  63 y.o.  Personal hx of   HTN, obesity immunocompromised state, male   respiratory failure requiring  4L [place]   ABS neutrophil to lymphocyte ratio >3.5     Will order Airborne and Contact precautions  Family/ patient prognosis discussion:  I have asked case with the family/ patient  who are aware of patient's prognosis At this point they would like   to be full code      . Polycythemia vera ([place]) -continue hydroxyurea for now  . Essential hypertension -restart home medications when able to tolerate  . Hypokalemia - will replete  . Acute respiratory failure with hypoxia ([place]) -given aggressive progression of hypoxia will start Actemra    Other plan as per orders.  DVT prophylaxis:   Lovenox     Code Status:  FULL CODE  as per patient   I had personally discussed CODE STATUS with patient   Family Communication:   Family not at   Bedside   Disposition Plan:     To home once workup is complete and patient is stable                                        Consults called: none  Admission status:  ED Disposition    ED Disposition Condition Comment   Admit  The patient appears reasonably stabilized for admission considering the current resources, flow, and capabilities available in the ED at this time, and I doubt any other [facility] requiring further screening and/or treatment in the ED prior to admission is  present.          inpatient     Expect 2 midnight stay secondary to severity of patient's current illness including   hemodynamic instability despite optimal treatment (  tachypnea  Hypoxia, )   Severe lab/radiological/exam abnormalities including:    COVID-19 positive test (U07.1, COVID-19) with Acute Pneumonia (J12.89,   .    That are currently affecting medical management.   I expect  patient to be hospitalized for 2 midnights requiring inpatient medical care.  Patient is at high risk for adverse outcome (such as loss of life or disability) if not treated.  Indication for inpatient stay as follows:    Hemodynamic instability despite maximal medical therapy,    New or worsening hypoxia  Need for   IV fluids, antiviral IV steroids    Level of care      tele   indefinitely please discontinue once patient no longer qualifies   Precautions: admitted as  covid positive Airborne and Contact precautions   PPE: Used by the provider:   P100  eye Goggles,  Gloves  gown  Olamide Lahaie 12/12/2019, 2:34 AM    [facility]     after 2 AM please page floor coverage PA If 7AM-7PM, please contact the day team taking care of the patient using [email or web address]

## 2019-12-12 NOTE — ED Notes (Signed)
Called pharmacy to send remdesivir

## 2019-12-12 NOTE — ED Notes (Signed)
Called pharmacy to send remdesivir and tocilizumab - they state that they will hand deliver

## 2019-12-12 NOTE — ED Notes (Signed)
Pt room reassigned, attempted to call floor, unable to contact RN

## 2019-12-12 NOTE — ED Notes (Signed)
Called floor to give report, RN not available

## 2019-12-12 NOTE — ED Notes (Signed)
Hand hygiene performed and pt given breakfast tray with juice Pt declined turning lights on - pt given remote and urinal emptied

## 2019-12-13 DIAGNOSIS — J9601 Acute respiratory failure with hypoxia: Secondary | ICD-10-CM

## 2019-12-13 LAB — CBC WITH DIFFERENTIAL/PLATELET
Abs Immature Granulocytes: 0.02 10*3/uL (ref 0.00–0.07)
Basophils Absolute: 0 10*3/uL (ref 0.0–0.1)
Basophils Relative: 0 %
Eosinophils Absolute: 0 10*3/uL (ref 0.0–0.5)
Eosinophils Relative: 0 %
HCT: 36.5 % — ABNORMAL LOW (ref 39.0–52.0)
Hemoglobin: 11.1 g/dL — ABNORMAL LOW (ref 13.0–17.0)
Immature Granulocytes: 1 %
Lymphocytes Relative: 6 %
Lymphs Abs: 0.2 10*3/uL — ABNORMAL LOW (ref 0.7–4.0)
MCH: 29.2 pg (ref 26.0–34.0)
MCHC: 30.4 g/dL (ref 30.0–36.0)
MCV: 96.1 fL (ref 80.0–100.0)
Monocytes Absolute: 0.1 10*3/uL (ref 0.1–1.0)
Monocytes Relative: 4 %
Neutro Abs: 3.1 10*3/uL (ref 1.7–7.7)
Neutrophils Relative %: 89 %
Platelets: 321 10*3/uL (ref 150–400)
RBC: 3.8 MIL/uL — ABNORMAL LOW (ref 4.22–5.81)
RDW: 16.7 % — ABNORMAL HIGH (ref 11.5–15.5)
WBC: 3.4 10*3/uL — ABNORMAL LOW (ref 4.0–10.5)
nRBC: 0 % (ref 0.0–0.2)

## 2019-12-13 LAB — COMPREHENSIVE METABOLIC PANEL
ALT: 20 U/L (ref 0–44)
AST: 23 U/L (ref 15–41)
Albumin: 2.7 g/dL — ABNORMAL LOW (ref 3.5–5.0)
Alkaline Phosphatase: 52 U/L (ref 38–126)
Anion gap: 11 (ref 5–15)
BUN: 21 mg/dL (ref 8–23)
CO2: 22 mmol/L (ref 22–32)
Calcium: 7.9 mg/dL — ABNORMAL LOW (ref 8.9–10.3)
Chloride: 107 mmol/L (ref 98–111)
Creatinine, Ser: 0.65 mg/dL (ref 0.61–1.24)
GFR calc Af Amer: >60 mL/min (ref >60)
GFR calc non Af Amer: >60 mL/min (ref >60)
Glucose, Bld: 167 mg/dL — ABNORMAL HIGH (ref 70–99)
Potassium: 3.9 mmol/L (ref 3.5–5.1)
Sodium: 140 mmol/L (ref 135–145)
Total Bilirubin: 0.8 mg/dL (ref 0.3–1.2)
Total Protein: 5.7 g/dL — ABNORMAL LOW (ref 6.5–8.1)

## 2019-12-13 LAB — FERRITIN: Ferritin: 241 ng/mL (ref 24–336)

## 2019-12-13 LAB — FIBRIN DERIVATIVES D-DIMER (ARMC ONLY): Fibrin derivatives D-dimer (ARMC): 707.18 ng/mL (FEU) — ABNORMAL HIGH (ref 0.00–499.00)

## 2019-12-13 LAB — C-REACTIVE PROTEIN: CRP: 7.9 mg/dL — ABNORMAL HIGH (ref <1.0)

## 2019-12-13 LAB — MAGNESIUM: Magnesium: 2.4 mg/dL (ref 1.7–2.4)

## 2019-12-13 LAB — HIV ANTIBODY (ROUTINE TESTING W REFLEX): HIV Screen 4th Generation wRfx: NONREACTIVE

## 2019-12-13 NOTE — Plan of Care (Signed)
  Problem: Clinical Measurements: Goal: Respiratory complications will improve Outcome: Progressing   Problem: Coping: Goal: Psychosocial and spiritual needs will be supported Outcome: Progressing   Problem: Respiratory: Goal: Will maintain a patent airway Outcome: Progressing

## 2019-12-13 NOTE — Progress Notes (Signed)
Pt continues on 6 liters of oxygen via [place] . Pt has a dry non productive cough and c/o tickle in his throat. No distress noted. [facility] . Pt is aware of transfer.

## 2019-12-13 NOTE — Discharge Summary (Addendum)
Physician Discharge Summary  DOS: 12/14/2019  Patient ID: Derek [name] MRN: [id number] DOB/AGE: July 08, 1956 63 y.o.  Admit date: 12/11/2019 Discharge date: 12/14/2019  Admission Diagnoses:  Discharge Diagnoses:  Active Problems:   Polycythemia vera ([place])   Essential hypertension   Hypokalemia   Acute respiratory failure with hypoxia (HCC)   Pneumonia due to COVID-19 virus   Acute hypoxemic respiratory failure due to COVID-19 [facility])   Discharged Condition: fair  Hospital Course:  Patient was admitted overnight after coming in with complaints of fever, cough, nausea and poor appetite for the past 10 days.  He had apparently been seen in the ER previously on 12/22, tested positive for Covid but was discharged home without need for acute hospital level care.  This visit however patient was hypoxic, requiring 3 L of oxygen to maintain his O2 sat over 90%. On the day of discharge his O2 need went up to 6L via [place] to maintain the required O2-sat.   Pneumonia due to COVID-19 virus -   FROM HOME   WITH KNOWN HX OF COVID19 Initial positive test on 12/08/2019  - new severe hypoxia  In this admission, the following following concerning LAB/ imaging findings: CBC: leukopenia, lymphopenia; ANC/ALC ratio>3.5 BMP: increased BUN/Cr   CRP, LDH: increased; IL-6 and Ferritin increased  - CXR: hazy bilateral peripheral opacities -Continue Decadron 6mg  q 24 hours -Continue Remdesivir pharmacy -Since CRP >7 and rapidly progressing hypoxia; Actemra x2 given -  daily d.dimer - Assess for ability to prone  - Supportive management -Fluid sparing resuscitation  -Provide oxygen as needed currently on   SpO2: 92 % O2 Flow Rate (L/min): 6 L/min - IF d.dimer elvated >5 will increase dose of lovenox - hold off  antibiotics  Monitor for evidence of superinfection  - Consult PCCM if becomes respiratory unstable   -   Airborne and Contact precautions -Transfer order to [employer] [facility] [place] placed in as he meets the  requirement; awaiting     . Polycythemia vera ([place]) -continue hydroxyurea for now  . Essential hypertension -labile  - restarted home medications   . Hypokalemia - resolved  -Status post placement   Other plan as per orders.  DVT prophylaxis:   Lovenox     Code Status:  FULL CODE  as per patient    Consults: None  Significant Diagnostic Studies: Imaging and blood work   Treatments: As hospital course   Discharge Exam: Blood pressure 117/76, pulse 82, temperature 97.6 F (36.4 C), temperature source Oral, resp. rate 20, height 5\' 9"  (1.753 m), weight 86.5 kg, SpO2 94 %.  1. General: Mild distress 2. Psychological: Alert and Oriented 3. Head/ENT:    Dry Mucous Membranes                          Head Non traumatic, neck supple                         Poor Dentition 4. SKIN:    decreased Skin turgor,  Skin clean Dry and intact no rash 5. Heart: Regular rate and rhythm no  Murmur, no Rub or gallop 6. Lungs:   no wheezes or crackles   7. Abdomen: Soft,  non-tender, Non distended  bowel sounds present 8. Lower extremities: no clubbing, cyanosis, no edema 9. Neurologically Grossly intact, moving all 4 extremities equally  10. MSK: Normal range of motion  Disposition: Discharge disposition: 02-Transferred to Short Term  Hospital     To be transferred to [place] for specialized Covid care.   Called Pt's sister as well and updated.   Discharge Instructions    Bed rest   Complete by: As directed    Diet - low sodium heart healthy   Complete by: As directed      Allergies as of 12/13/2019      Reactions   Penicillin G    Other reaction(s): Localized superficial swelling of skin      Medication List    TAKE these medications   Acetaminophen-Codeine 300-30 MG tablet Take 1 tablet by mouth every 8 (eight) hours.   amLODipine 5 MG tablet Commonly known as: NORVASC Take 5 mg daily by mouth.   aspirin EC 81 MG tablet Take 81 mg daily by mouth.    azithromycin 250 MG tablet Commonly known as: Zithromax Z-Pak Take 2 tablets (500 mg) on  Day 1,  followed by 1 tablet (250 mg) once daily on Days 2 through 5.   hydroxyurea 500 MG capsule Commonly known as: HYDREA TAKE 1 TABLET BY MOUTH IN THE AM AND PM MON-FRI AND TAKE 1 TABLET IN THE AM AND 2 TAB IN THE PM SAT-SUN   MULTIVITAMIN ADULT PO Take 1 tablet by mouth 1 day or 1 dose.   NEXIUM PO Take 1 capsule by mouth daily.   ondansetron 4 MG disintegrating tablet Commonly known as: Zofran ODT Take 1 tablet (4 mg total) by mouth every 8 (eight) hours as needed for nausea or vomiting.   Vitamin D-1000 Max St 25 MCG (1000 UT) tablet Generic drug: Cholecalciferol Take 1,000 Units daily by mouth.        Signed: Thornell Mule 12/13/2019, 3:16 PM

## 2019-12-13 NOTE — Plan of Care (Signed)
  Problem: Education: Goal: Knowledge of risk factors and measures for prevention of condition will improve Outcome: Progressing   Problem: Respiratory: Goal: Will maintain a patent airway Outcome: Progressing   

## 2019-12-14 ENCOUNTER — Encounter (HOSPITAL_COMMUNITY): Payer: Self-pay | Admitting: Internal Medicine

## 2019-12-14 ENCOUNTER — Other Ambulatory Visit: Payer: Self-pay

## 2019-12-14 ENCOUNTER — Inpatient Hospital Stay (HOSPITAL_COMMUNITY)
Admit: 2019-12-14 | Discharge: 2019-12-14 | Disposition: A | Discharge status: Home / Self Care | Payer: No Typology Code available for payment source | Attending: Family Medicine | Admitting: Family Medicine

## 2019-12-14 ENCOUNTER — Inpatient Hospital Stay (HOSPITAL_COMMUNITY)
Admission: AD | Admit: 2019-12-14 | Discharge: 2019-12-17 | DRG: 177 | Disposition: A | Discharge status: Home / Self Care | Payer: No Typology Code available for payment source | Source: Other Acute Inpatient Hospital | Attending: Internal Medicine | Admitting: Internal Medicine

## 2019-12-14 DIAGNOSIS — M4850XA Collapsed vertebra, not elsewhere classified, site unspecified, initial encounter for fracture: Secondary | ICD-10-CM | POA: Diagnosis present

## 2019-12-14 DIAGNOSIS — U071 COVID-19: Secondary | ICD-10-CM | POA: Diagnosis present

## 2019-12-14 DIAGNOSIS — R161 Splenomegaly, not elsewhere classified: Secondary | ICD-10-CM | POA: Diagnosis present

## 2019-12-14 DIAGNOSIS — I471 Supraventricular tachycardia: Secondary | ICD-10-CM | POA: Diagnosis not present

## 2019-12-14 DIAGNOSIS — J9601 Acute respiratory failure with hypoxia: Secondary | ICD-10-CM | POA: Diagnosis present

## 2019-12-14 DIAGNOSIS — K219 Gastro-esophageal reflux disease without esophagitis: Secondary | ICD-10-CM | POA: Diagnosis present

## 2019-12-14 DIAGNOSIS — J1282 Pneumonia due to coronavirus disease 2019: Secondary | ICD-10-CM

## 2019-12-14 DIAGNOSIS — D45 Polycythemia vera: Secondary | ICD-10-CM | POA: Diagnosis present

## 2019-12-14 DIAGNOSIS — J1289 Other viral pneumonia: Secondary | ICD-10-CM | POA: Diagnosis present

## 2019-12-14 DIAGNOSIS — I10 Essential (primary) hypertension: Secondary | ICD-10-CM | POA: Diagnosis present

## 2019-12-14 LAB — CBC WITH DIFFERENTIAL/PLATELET
Abs Immature Granulocytes: 0.11 K/uL — ABNORMAL HIGH (ref 0.00–0.07)
Basophils Absolute: 0 K/uL (ref 0.0–0.1)
Basophils Relative: 0 %
Eosinophils Absolute: 0 K/uL (ref 0.0–0.5)
Eosinophils Relative: 0 %
HCT: 38.6 % — ABNORMAL LOW (ref 39.0–52.0)
Hemoglobin: 12.2 g/dL — ABNORMAL LOW (ref 13.0–17.0)
Immature Granulocytes: 2 %
Lymphocytes Relative: 4 %
Lymphs Abs: 0.2 K/uL — ABNORMAL LOW (ref 0.7–4.0)
MCH: 28.7 pg (ref 26.0–34.0)
MCHC: 31.6 g/dL (ref 30.0–36.0)
MCV: 90.8 fL (ref 80.0–100.0)
Monocytes Absolute: 0.2 K/uL (ref 0.1–1.0)
Monocytes Relative: 3 %
Neutro Abs: 5.1 K/uL (ref 1.7–7.7)
Neutrophils Relative %: 91 %
Platelets: 457 K/uL — ABNORMAL HIGH (ref 150–400)
RBC: 4.25 MIL/uL (ref 4.22–5.81)
RDW: 16.4 % — ABNORMAL HIGH (ref 11.5–15.5)
WBC: 5.6 K/uL (ref 4.0–10.5)
nRBC: 0.4 % — ABNORMAL HIGH (ref 0.0–0.2)

## 2019-12-14 LAB — COMPREHENSIVE METABOLIC PANEL
ALT: 31 U/L (ref 0–44)
AST: 28 U/L (ref 15–41)
Albumin: 2.8 g/dL — ABNORMAL LOW (ref 3.5–5.0)
Alkaline Phosphatase: 51 U/L (ref 38–126)
Anion gap: 9 (ref 5–15)
BUN: 23 mg/dL (ref 8–23)
CO2: 24 mmol/L (ref 22–32)
Calcium: 8.1 mg/dL — ABNORMAL LOW (ref 8.9–10.3)
Chloride: 107 mmol/L (ref 98–111)
Creatinine, Ser: 0.75 mg/dL (ref 0.61–1.24)
GFR calc Af Amer: >60 mL/min (ref >60)
GFR calc non Af Amer: >60 mL/min (ref >60)
Glucose, Bld: 150 mg/dL — ABNORMAL HIGH (ref 70–99)
Potassium: 4.2 mmol/L (ref 3.5–5.1)
Sodium: 140 mmol/L (ref 135–145)
Total Bilirubin: 0.8 mg/dL (ref 0.3–1.2)
Total Protein: 5.8 g/dL — ABNORMAL LOW (ref 6.5–8.1)

## 2019-12-14 LAB — MAGNESIUM: Magnesium: 2.1 mg/dL (ref 1.7–2.4)

## 2019-12-14 LAB — FIBRIN DERIVATIVES D-DIMER (ARMC ONLY): Fibrin derivatives D-dimer (ARMC): 397.96 ng{FEU}/mL (ref 0.00–499.00)

## 2019-12-14 LAB — PROCALCITONIN: Procalcitonin: 0.37 ng/mL

## 2019-12-14 LAB — ECHOCARDIOGRAM COMPLETE
Height: 69 in
Weight: 3024 oz

## 2019-12-14 LAB — D-DIMER, QUANTITATIVE: D-Dimer, Quant: 0.62 ug/mL-FEU — ABNORMAL HIGH (ref 0.00–0.50)

## 2019-12-14 LAB — C-REACTIVE PROTEIN
CRP: 2.2 mg/dL — ABNORMAL HIGH
CRP: 1.6 mg/dL — ABNORMAL HIGH (ref <1.0)

## 2019-12-14 LAB — ABO/RH: ABO/RH(D): A POS

## 2019-12-14 LAB — FERRITIN: Ferritin: 204 ng/mL (ref 24–336)

## 2019-12-14 LAB — BRAIN NATRIURETIC PEPTIDE: B Natriuretic Peptide: 117.1 pg/mL — ABNORMAL HIGH (ref 0.0–100.0)

## 2019-12-14 MED ORDER — METHYLPREDNISOLONE SODIUM SUCC 125 MG IJ SOLR
60.0000 mg | Freq: Two times a day (BID) | INTRAMUSCULAR | Status: DC
  Administered 2019-12-14 – 2019-12-16 (×4): 60 mg via INTRAVENOUS
  Filled 2019-12-14 (×4): qty 2

## 2019-12-14 MED ORDER — PANTOPRAZOLE SODIUM 40 MG PO TBEC
40.0000 mg | DELAYED_RELEASE_TABLET | Freq: Every day | ORAL | Status: DC
  Administered 2019-12-15 – 2019-12-17 (×3): 40 mg via ORAL
  Filled 2019-12-14 (×3): qty 1

## 2019-12-14 MED ORDER — ACETAMINOPHEN 325 MG PO TABS: 650.0000 mg | ORAL_TABLET | Freq: Four times a day (QID) | ORAL | Status: DC | PRN

## 2019-12-14 MED ORDER — AMLODIPINE BESYLATE 5 MG PO TABS
5.0000 mg | ORAL_TABLET | Freq: Every day | ORAL | Status: DC
  Administered 2019-12-15 – 2019-12-17 (×3): 5 mg via ORAL
  Filled 2019-12-14 (×3): qty 1

## 2019-12-14 MED ORDER — ONDANSETRON HCL 4 MG/2ML IJ SOLN: 4.0000 mg | Freq: Four times a day (QID) | INTRAMUSCULAR | Status: DC | PRN

## 2019-12-14 MED ORDER — ASPIRIN EC 81 MG PO TBEC
81.0000 mg | DELAYED_RELEASE_TABLET | Freq: Every day | ORAL | Status: DC
  Administered 2019-12-15 – 2019-12-17 (×3): 81 mg via ORAL
  Filled 2019-12-14 (×3): qty 1

## 2019-12-14 MED ORDER — SODIUM CHLORIDE 0.9 % IV SOLN: 200.0000 mg | Freq: Once | INTRAVENOUS | Status: DC

## 2019-12-14 MED ORDER — BISACODYL 5 MG PO TBEC: 5.0000 mg | DELAYED_RELEASE_TABLET | Freq: Every day | ORAL | Status: DC | PRN

## 2019-12-14 MED ORDER — ENOXAPARIN SODIUM 40 MG/0.4ML ~~LOC~~ SOLN
40.0000 mg | SUBCUTANEOUS | Status: DC
  Administered 2019-12-15 – 2019-12-17 (×3): 40 mg via SUBCUTANEOUS
  Filled 2019-12-14 (×3): qty 0.4

## 2019-12-14 MED ORDER — SODIUM CHLORIDE 0.9 % IV SOLN
100.0000 mg | Freq: Every day | INTRAVENOUS | Status: AC
  Administered 2019-12-14 – 2019-12-16 (×3): 100 mg via INTRAVENOUS
  Filled 2019-12-14 (×3): qty 20

## 2019-12-14 MED ORDER — ALBUTEROL SULFATE HFA 108 (90 BASE) MCG/ACT IN AERS
2.0000 | INHALATION_SPRAY | Freq: Four times a day (QID) | RESPIRATORY_TRACT | Status: DC | PRN
  Filled 2019-12-14: qty 6.7

## 2019-12-14 MED ORDER — HYDROXYUREA 500 MG PO CAPS: 500.0000 mg | ORAL_CAPSULE | Freq: Every day | ORAL | Status: DC

## 2019-12-14 MED ORDER — SODIUM CHLORIDE 0.9 % IV SOLN: 100.0000 mg | Freq: Every day | INTRAVENOUS | Status: DC

## 2019-12-14 MED ORDER — ONDANSETRON HCL 4 MG PO TABS: 4.0000 mg | ORAL_TABLET | Freq: Four times a day (QID) | ORAL | Status: DC | PRN

## 2019-12-14 MED ORDER — HYDROXYUREA 500 MG PO CAPS
500.0000 mg | ORAL_CAPSULE | Freq: Every day | ORAL | Status: DC
  Administered 2019-12-15 – 2019-12-17 (×3): 500 mg via ORAL
  Filled 2019-12-14 (×3): qty 1

## 2019-12-14 NOTE — Progress Notes (Signed)
PROGRESS NOTE                                                                                                                                                                                                             Patient Demographics:    Derek [name], is a 63 y.o. male, DOB - 05/22/1956, IHW:[id number]  Outpatient Primary MD for the patient is Dion Body, MD    LOS - 0  Admit date - 12/14/2019    CC - Cough     Brief Narrative this is a 63 year old Caucasian male with history of polycythemia vera on hydroxyurea for it, GERD, essential hypertension who was admitted to [facility] on 12/12/2019 for COVID-19 pneumonia causing acute hypoxic respiratory failure, he was transferred to [facility] for further care on 12/14/2019.   Subjective:    Derek [name] today has, No headache, No chest pain, No abdominal pain - No Nausea, No new weakness tingling or numbness, minimal shortness of breath   Assessment  & Plan :     1. Acute Hypoxic Resp. Failure due to Acute Covid 19 Viral Pneumonitis during the ongoing 2020 Covid 19 Pandemic - he to moderate disease, started on IV steroids along with remdesivir with good improvement, he is currently on 2 L nasal cannula, will continue to monitor.  He has consented for Actemra use if needed in the future.  For now have encouraged him to sit in chair in the daytime use I-S and flutter valve for pulmonary toiletry and then prone in bed when at night.  Actemra off label use - patient was told that if COVID-19 pneumonitis gets worse we might potentially use Actemra off label, patient denies any known history of tuberculosis or hepatitis, understands the risks and benefits and wants to proceed with Actemra treatment if required.   SpO2: 94 % O2 Flow Rate (L/min): 2 L/min  Recent Labs  Lab 12/11/19 2348 12/12/19 0313 12/12/19 0507 12/13/19 0556 12/14/19 0740  CRP 16.3*  --  16.4*  7.9* 2.2*  FERRITIN 209 192  --  241 204  BNP 75.0  --   --   --   --   PROCALCITON 2.46 1.97  --   --   --     Hepatic Function Latest Ref Rng & Units 12/14/2019 12/13/2019 12/12/2019  Total Protein 6.5 - 8.1  g/dL 5.8(L) 5.7(L) 5.8(L)  Albumin 3.5 - 5.0 g/dL 2.8(L) 2.7(L) 2.8(L)  AST 15 - 41 U/L _0 ALT 0 - 44 U/L _1 Alk Phosphatase 38 - 126 U/L 51 52 53  Total Bilirubin 0.3 - 1.2 mg/dL 0.8 0.8 1.1    2.  Polycythemia vera with splenomegaly.  Continue hydroxyurea follow with PCP.  3.  Incidental finding of upper T-spine fracture on CTA.  Supportive care.  No pain.  4.  GERD.  PPI.  5.  Hypertension.  On Norvasc.      Condition - Fair  Family Communication  :  None  Code Status :  Full  Diet :   Diet Order            Diet Heart Room service appropriate? Yes; Fluid consistency: Thin  Diet effective now               Disposition Plan  :  Home  Consults  :  None  Procedures  :    CTA - 1.  No evidence of pulmonary embolism. 2. Moderate bilateral patchy airspace process peripheral predominant likely multifocal pneumonia. Tiny amount of bilateral pleural fluid. Minimal reactive hilar adenopathy. 3.  Small pericardial effusion slightly worse compared to 2016. 4.  Moderate splenomegaly. 5. Moderate stable chronic compression fracture over the upper thoracic spine.  PUD Prophylaxis :  PPI  DVT Prophylaxis  :  Lovenox    Lab Results  Component Value Date   PLT 457 (H) 12/14/2019    Inpatient Medications  Scheduled Meds: . [START ON 12/15/2019] amLODipine  5 mg Oral Daily  . [START ON 12/15/2019] aspirin EC  81 mg Oral Daily  . [START ON 12/15/2019] enoxaparin (LOVENOX) injection  40 mg Subcutaneous Q24H  . [START ON 12/15/2019] hydroxyurea  500 mg Oral Daily  . methylPREDNISolone (SOLU-MEDROL) injection  60 mg Intravenous Q12H  . [START ON 12/15/2019] pantoprazole  40 mg Oral Daily   Continuous Infusions: . remdesivir 100 mg in NS 100 mL      PRN Meds:.acetaminophen, albuterol, bisacodyl, ondansetron **OR** ondansetron (ZOFRAN) IV  Antibiotics  :    Anti-infectives (From admission, onward)   Start     Dose/Rate Route Frequency Ordered Stop   12/15/19 1000  remdesivir 100 mg in sodium chloride 0.9 % 100 mL IVPB  Status:  Discontinued     100 mg 200 mL/hr over 30 Minutes Intravenous Daily 12/14/19 1232 12/14/19 1239   12/14/19 1245  remdesivir 200 mg in sodium chloride 0.9% 250 mL IVPB  Status:  Discontinued     200 mg 580 mL/hr over 30 Minutes Intravenous Once 12/14/19 1232 12/14/19 1239   12/14/19 1245  remdesivir 100 mg in sodium chloride 0.9 % 100 mL IVPB     100 mg 200 mL/hr over 30 Minutes Intravenous Daily 12/14/19 1241 12/17/19 0959       Time Spent in minutes  30   Lala Lund M.D on 12/14/2019 at 2:15 PM  To page go to [email or web address] - password [facility]  [facility] -  Office  [phone]    See all Orders from today for further details    Objective:   Vitals:   12/14/19 1228 12/14/19 1230  BP: 124/70 118/71  Pulse: 94 81  Resp: 20 20  Temp: 98.7 F (37.1 C)   TempSrc: Oral   SpO2: 95% 94%    Wt Readings from Last 3 Encounters:  12/14/19 85.7 kg  12/08/19 85.7  kg  07/15/19 89.8 kg     Intake/Output Summary (Last 24 hours) at 12/14/2019 1415 Last data filed at 12/14/2019 1300 Gross per 24 hour  Intake --  Output 350 ml  Net -350 ml     Physical Exam  Awake Alert, Oriented X 3, No new F.N deficits, Normal affect Circle.AT,PERRAL Supple Neck,No JVD, No cervical lymphadenopathy appriciated.  Symmetrical Chest wall movement, Good air movement bilaterally, CTAB RRR,No Gallops,Rubs or new Murmurs, No Parasternal Heave +ve B.Sounds, Abd Soft, No tenderness, No organomegaly appriciated, No rebound - guarding or rigidity. No Cyanosis, Clubbing or edema, No new Rash or bruise      Data Review:    CBC Recent Labs  Lab 12/08/19 1659 12/11/19 2216 12/12/19 0507 12/13/19 0556  12/14/19 0740  WBC 4.3 5.9 2.9* 3.4* 5.6  HGB 13.1 12.2* 11.0* 11.1* 12.2*  HCT 40.8 38.0* 35.2* 36.5* 38.6*  PLT 146* 250 212 321 457*  MCV 90.1 89.4 94.4 96.1 90.8  MCH 28.9 28.7 29.5 29.2 28.7  MCHC 32.1 32.1 31.3 30.4 31.6  RDW 16.3* 16.8* 16.7* 16.7* 16.4*  LYMPHSABS 0.3* 0.2* 0.1* 0.2* 0.2*  MONOABS 0.1 0.1 0.1 0.1 0.2  EOSABS 0.0 0.0 0.0 0.0 0.0  BASOSABS 0.0 0.0 0.0 0.0 0.0    Chemistries  Recent Labs  Lab 12/08/19 1659 12/11/19 2216 12/11/19 2348 12/12/19 0313 12/12/19 0507 12/13/19 0556 12/14/19 0740  NA 136 138 137 137  --  140 140  K 3.7 3.5 3.4* 3.6  --  3.9 4.2  CL 99 102 101 105  --  107 107  CO2 _0 --  22 24  GLUCOSE 130* 130* 130* 151*  --  167* 150*  BUN _1 --  21 23  CREATININE 0.98 1.14 1.18 0.96  --  0.65 0.75  CALCIUM 8.4* 8.0* 7.8* 7.5*  --  7.9* 8.1*  MG  --   --   --   --  2.1 2.4 2.1  AST 35  --  27 28  --  23 28  ALT 21  --  22 20  --  20 31  ALKPHOS 77  --  60 53  --  52 51  BILITOT 1.8*  --  1.1 1.1  --  0.8 0.8   ------------------------------------------------------------------------------------------------------------------ Recent Labs    12/11/19 2348  TRIG 68    No results found for: HGBA1C ------------------------------------------------------------------------------------------------------------------ Recent Labs    12/12/19 0507  TSH 0.822    Cardiac Enzymes No results for input(s): CKMB, TROPONINI, MYOGLOBIN in the last 168 hours.  Invalid input(s): CK ------------------------------------------------------------------------------------------------------------------    Component Value Date/Time   BNP 75.0 12/11/2019 2348    Micro Results Recent Results (from the past 240 hour(s))  Blood Culture (routine x 2)     Status: None (Preliminary result)   Collection Time: 12/11/19 11:49 PM   Specimen: BLOOD LEFT HAND  Result Value Ref Range Status   Specimen Description BLOOD LEFT HAND  Final    Special Requests   Final    BOTTLES DRAWN AEROBIC AND ANAEROBIC Blood Culture adequate volume   Culture   Final    NO GROWTH 2 DAYS Performed at [facility], [address] [place], [place] [address]    Report Status PENDING  Incomplete  Blood Culture (routine x 2)     Status: None (Preliminary result)   Collection Time: 12/11/19 11:49 PM   Specimen: BLOOD RIGHT HAND  Result Value  Ref Range Status   Specimen Description BLOOD RIGHT HAND  Final   Special Requests   Final    BOTTLES DRAWN AEROBIC AND ANAEROBIC Blood Culture adequate volume   Culture   Final    NO GROWTH 2 DAYS Performed at [facility], [address], [place], [place] [address]    Report Status PENDING  Incomplete    Radiology Reports DG Chest 2 View  Addendum Date: 12/08/2019   ADDENDUM REPORT: 12/08/2019 18:10 ADDENDUM: Subtle retrocardiac opacity could also represent developing infection or atelectasis. Electronically Signed   By: Zetta Bills M.D.   On: 12/08/2019 18:10   Result Date: 12/08/2019 CLINICAL DATA:  Cough cough, fever EXAM: CHEST - 2 VIEW COMPARISON:  CT chest of 03/18/2015 FINDINGS: Cardiomediastinal contours are normal. Subtle increased interstitial markings with slight asymmetry. No signs of dense consolidation or effusion. Visualized skeletal structures are unremarkable. IMPRESSION: 1. Subtle increased interstitial markings with slight asymmetry. Findings could represent early viral or atypical pneumonia. 2. No signs of dense consolidation or effusion. Electronically Signed: By: Zetta Bills M.D. On: 12/08/2019 18:07   CT Angio Chest PE W and/or Wo Contrast  Result Date: 12/12/2019 CLINICAL DATA:  Shortness of breath. COVID-19 positive without improvement. Cough and fever. Patient on oral chemotherapy for cancer. EXAM: CT ANGIOGRAPHY CHEST WITH CONTRAST TECHNIQUE: Multidetector CT imaging of the chest was performed using the standard protocol during bolus administration of  intravenous contrast. Multiplanar CT image reconstructions and MIPs were obtained to evaluate the vascular anatomy. CONTRAST:  37m OMNIPAQUE IOHEXOL 350 MG/ML SOLN COMPARISON:  03/18/2015 FINDINGS: Cardiovascular: Heart is normal size. There is a very small amount of pericardial fluid present slightly worse. Subtle calcified plaque over the left anterior descending coronary artery. Thoracic aorta is normal in caliber and demonstrates very minimal calcified plaque. Pulmonary arterial system is well opacified without evidence of emboli. Remaining vascular structures are unremarkable. Mediastinum/Nodes: 1 cm right hilar lymph node and 1.2 cm left hilar lymph node. These are likely reactive. No significant mediastinal adenopathy. Remaining mediastinal structures are unremarkable. Lungs/Pleura: Lungs are adequately inflated demonstrate patchy bilateral peripheral predominant airspace process likely multifocal pneumonia. Tiny amount of bilateral pleural fluid. Airways are normal. Upper Abdomen: Moderate splenomegaly measuring 18.4 cm in greatest diameter. No acute findings. Musculoskeletal: Stable old upper thoracic spine moderate compression fracture. Remainder of the exam is unchanged. Review of the MIP images confirms the above findings. IMPRESSION: 1.  No evidence of pulmonary embolism. 2. Moderate bilateral patchy airspace process peripheral predominant likely multifocal pneumonia. Tiny amount of bilateral pleural fluid. Minimal reactive hilar adenopathy. 3.  Small pericardial effusion slightly worse compared to 2016. 4.  Moderate splenomegaly. 5. Moderate stable chronic compression fracture over the upper thoracic spine. Electronically Signed   By: DMarin OlpM.D.   On: 12/12/2019 06:23   DG Chest Port 1 View  Result Date: 12/11/2019 CLINICAL DATA:  COVID-19 EXAM: PORTABLE CHEST 1 VIEW COMPARISON:  12/08/2019 FINDINGS: Worsening bilateral pulmonary opacities. No pleural effusion or pneumothorax. Normal  cardiomediastinal contours. IMPRESSION: Worsening bilateral pulmonary opacities, likely indicating worsening multifocal infection. Electronically Signed   By: KUlyses JarredM.D.   On: 12/11/2019 23:38   [place]LEEN (ABDOMEN LIMITED)  Result Date: 11/25/2019 CLINICAL DATA:  History of polycythemia EXAM: ULTRASOUND ABDOMEN LIMITED COMPARISON:  12/26/2018 FINDINGS: Spleen is mildly enlarged measuring 16.9 cm in greatest length. This is stable in appearance from the prior exam. Calculated volume is 1142.9 mL this is relatively stable from the prior study.  No focal splenic lesion is noted. IMPRESSION: Splenomegaly stable from the prior exam. Electronically Signed   By: Inez Catalina M.D.   On: 11/25/2019 15:17

## 2019-12-14 NOTE — Progress Notes (Signed)
Pt transported to [facility] accompanied by 2 EMT. No distress noted.  Pt had a burst of SVT 157 at 0845. Pt was asymptomatic.  Dr Izetta Dakin made aware New orders for EKG and ECHO which was  done before pt left. Nurse attempted to call [facility] to give report but with no answer.

## 2019-12-14 NOTE — Progress Notes (Signed)
Patients sister Darnelle Catalan called for update, RN unable to take call at time. This RN attempted to call her back but no one answered. Mailbox was full and unable to leave a voicemail at this time.   Derek [name]

## 2019-12-14 NOTE — Progress Notes (Signed)
*  PRELIMINARY RESULTS* Echocardiogram 2D Echocardiogram has been performed.  Derek [name] 12/14/2019, 10:58 AM

## 2019-12-14 NOTE — Progress Notes (Signed)
Patient ambulated approximately 15 ft, resting oxygen saturations 94% on 6L, ambulating saturations 90-91L%. Patient visibly DOE but recovered well with rest. Will continue to monitor.   Derek [name]

## 2019-12-14 NOTE — Progress Notes (Signed)
Subjective: Overnight supplemental O2 demand went up to 6 L to maintain saturation above 92%.  She also has had dry intermittent cough.  Objective: Blood pressure 117/76, pulse 82, temperature 97.6 F (36.4 C), temperature source Oral, resp. rate 20, height 5\' 9"  (1.753 m), weight 86.5 kg, SpO2 94 %.  PEx:  1. General: Mild distress 2. Psychological: Alert and Oriented 3. Head/ENT: Dry Mucous Membranes Head Non traumatic, neck supple Poor Dentition 4. SKIN: decreased Skin turgor, Skin clean Dry and intact no rash 5. Heart: Regular rate and rhythm no Murmur, no Rub or gallop 6. Lungs: no wheezes or crackles  7. Abdomen: Soft, non-tender, Non distended bowel sounds present 8. Lower extremities: no clubbing, cyanosis, no edema 9. Neurologically Grossly intact, moving all 4 extremities equally  10. MSK: Normal range of motion  CMP Latest Ref Rng & Units 12/14/2019 12/13/2019 12/12/2019  Glucose 70 - 99 mg/dL 150(H) 167(H) 151(H)  BUN 8 - 23 mg/dL 23 21 20   Creatinine 0.61 - 1.24 mg/dL 0.75 0.65 0.96  Sodium 135 - 145 mmol/L 140 140 137  Potassium 3.5 - 5.1 mmol/L 4.2 3.9 3.6  Chloride 98 - 111 mmol/L 107 107 105  CO2 22 - 32 mmol/L 24 22 22   Calcium 8.9 - 10.3 mg/dL 8.1(L) 7.9(L) 7.5(L)  Total Protein 6.5 - 8.1 g/dL 5.8(L) 5.7(L) 5.8(L)  Total Bilirubin 0.3 - 1.2 mg/dL 0.8 0.8 1.1  Alkaline Phos 38 - 126 U/L 51 52 53  AST 15 - 41 U/L 28 23 28   ALT 0 - 44 U/L 31 20 20    CBC Latest Ref Rng & Units 12/14/2019 12/13/2019 12/12/2019  WBC 4.0 - 10.5 K/uL 5.6 3.4(L) 2.9(L)  Hemoglobin 13.0 - 17.0 g/dL 12.2(L) 11.1(L) 11.0(L)  Hematocrit 39.0 - 52.0 % 38.6(L) 36.5(L) 35.2(L)  Platelets 150 - 400 K/uL 457(H) 321 212    DG Chest 2 View  Addendum Date: 12/08/2019   ADDENDUM REPORT: 12/08/2019 18:10 ADDENDUM: Subtle retrocardiac opacity could also represent developing infection or atelectasis. Electronically Signed   By:  Zetta Bills M.D.   On: 12/08/2019 18:10   Result Date: 12/08/2019 CLINICAL DATA:  Cough cough, fever EXAM: CHEST - 2 VIEW COMPARISON:  CT chest of 03/18/2015 FINDINGS: Cardiomediastinal contours are normal. Subtle increased interstitial markings with slight asymmetry. No signs of dense consolidation or effusion. Visualized skeletal structures are unremarkable. IMPRESSION: 1. Subtle increased interstitial markings with slight asymmetry. Findings could represent early viral or atypical pneumonia. 2. No signs of dense consolidation or effusion. Electronically Signed: By: Zetta Bills M.D. On: 12/08/2019 18:07   CT Angio Chest PE W and/or Wo Contrast  Result Date: 12/12/2019 CLINICAL DATA:  Shortness of breath. COVID-19 positive without improvement. Cough and fever. Patient on oral chemotherapy for cancer. EXAM: CT ANGIOGRAPHY CHEST WITH CONTRAST TECHNIQUE: Multidetector CT imaging of the chest was performed using the standard protocol during bolus administration of intravenous contrast. Multiplanar CT image reconstructions and MIPs were obtained to evaluate the vascular anatomy. CONTRAST:  65mL OMNIPAQUE IOHEXOL 350 MG/ML SOLN COMPARISON:  03/18/2015 FINDINGS: Cardiovascular: Heart is normal size. There is a very small amount of pericardial fluid present slightly worse. Subtle calcified plaque over the left anterior descending coronary artery. Thoracic aorta is normal in caliber and demonstrates very minimal calcified plaque. Pulmonary arterial system is well opacified without evidence of emboli. Remaining vascular structures are unremarkable. Mediastinum/Nodes: 1 cm right hilar lymph node and 1.2 cm left hilar lymph node. These are likely reactive. No significant mediastinal adenopathy.  Remaining mediastinal structures are unremarkable. Lungs/Pleura: Lungs are adequately inflated demonstrate patchy bilateral peripheral predominant airspace process likely multifocal pneumonia. Tiny amount of bilateral  pleural fluid. Airways are normal. Upper Abdomen: Moderate splenomegaly measuring 18.4 cm in greatest diameter. No acute findings. Musculoskeletal: Stable old upper thoracic spine moderate compression fracture. Remainder of the exam is unchanged. Review of the MIP images confirms the above findings. IMPRESSION: 1.  No evidence of pulmonary embolism. 2. Moderate bilateral patchy airspace process peripheral predominant likely multifocal pneumonia. Tiny amount of bilateral pleural fluid. Minimal reactive hilar adenopathy. 3.  Small pericardial effusion slightly worse compared to 2016. 4.  Moderate splenomegaly. 5. Moderate stable chronic compression fracture over the upper thoracic spine. Electronically Signed   By: Marin Olp M.D.   On: 12/12/2019 06:23   DG Chest Port 1 View  Result Date: 12/11/2019 CLINICAL DATA:  COVID-19 EXAM: PORTABLE CHEST 1 VIEW COMPARISON:  12/08/2019 FINDINGS: Worsening bilateral pulmonary opacities. No pleural effusion or pneumothorax. Normal cardiomediastinal contours. IMPRESSION: Worsening bilateral pulmonary opacities, likely indicating worsening multifocal infection. Electronically Signed   By: Ulyses Jarred M.D.   On: 12/11/2019 23:38   US SPLEEN (ABDOMEN LIMITED)  Result Date: 11/25/2019 CLINICAL DATA:  History of polycythemia EXAM: ULTRASOUND ABDOMEN LIMITED COMPARISON:  12/26/2018 FINDINGS: Spleen is mildly enlarged measuring 16.9 cm in greatest length. This is stable in appearance from the prior exam. Calculated volume is 1142.9 mL this is relatively stable from the prior study. No focal splenic lesion is noted. IMPRESSION: Splenomegaly stable from the prior exam. Electronically Signed   By: Inez Catalina M.D.   On: 11/25/2019 15:17    Scheduled Meds: Continuous Infusions: PRN Meds:  Assessment/Plan: Pneumonia due to COVID-19 virus -FROM HOME WITH KNOWN HX OF COVID19 Initial positive test on 12/08/2019 - new severe hypoxia  In this admission, following  concerning LAB/ imaging findings: CBC: leukopenia, lymphopenia; ANC/ALC ratio>3.5 BMP: increased BUN/Cr  CRP, LDH: increased; IL-6 and Ferritin increased  - CXR: hazy bilateral peripheral opacities -Continue Decadron 6mg  q 24 hours -ContinueRemdesivir pharmacy -Since CRP >7 and rapidly progressing hypoxia; Actemra x2 given -  daily d.dimer - Assess for ability to prone - Supportive management -Fluid sparing resuscitation -Provide oxygen as needed currently on SpO2: 92 % O2 Flow Rate (L/min): 6 L/min - IF d.dimer elvated >5 will increase dose of lovenox -hold offantibiotics Monitor for evidence ofsuperinfection  - Consult PCCM if becomes respiratory unstable  - Airborne and Contact precautions -Transfer order to [place] placed in as he meets the requirement; awaiting   .Polycythemia vera ([place]) -continue hydroxyurea for now  .Essential hypertension -labile  - restarted home medications   .Hypokalemia -resolved  -Status post placement  Other plan as per orders.  DVT prophylaxis:Lovenox   Code Status:FULL CODE as per patient   Consults: None   LOS: 2 days   Kirbie Stodghill Izetta Dakin

## 2019-12-14 NOTE — H&P (Signed)
Patient transferred from [facility] hospital for COVID-19 pneumonia treatment.  Kindly see discharge summary and recent H&P for all details.  Kindly see today's progress note for the plan.  This is a no charge note.

## 2019-12-14 NOTE — Plan of Care (Signed)

## 2019-12-15 LAB — CBC WITH DIFFERENTIAL/PLATELET
Abs Immature Granulocytes: 0.05 10*3/uL (ref 0.00–0.07)
Basophils Absolute: 0 10*3/uL (ref 0.0–0.1)
Basophils Relative: 0 %
Eosinophils Absolute: 0 10*3/uL (ref 0.0–0.5)
Eosinophils Relative: 0 %
HCT: 41.7 % (ref 39.0–52.0)
Hemoglobin: 12.3 g/dL — ABNORMAL LOW (ref 13.0–17.0)
Immature Granulocytes: 1 %
Lymphocytes Relative: 4 %
Lymphs Abs: 0.2 10*3/uL — ABNORMAL LOW (ref 0.7–4.0)
MCH: 29 pg (ref 26.0–34.0)
MCHC: 29.5 g/dL — ABNORMAL LOW (ref 30.0–36.0)
MCV: 98.3 fL (ref 80.0–100.0)
Monocytes Absolute: 0.3 10*3/uL (ref 0.1–1.0)
Monocytes Relative: 4 %
Neutro Abs: 5.1 10*3/uL (ref 1.7–7.7)
Neutrophils Relative %: 91 %
Platelets: 526 10*3/uL — ABNORMAL HIGH (ref 150–400)
RBC: 4.24 MIL/uL (ref 4.22–5.81)
RDW: 16.4 % — ABNORMAL HIGH (ref 11.5–15.5)
WBC: 5.6 10*3/uL (ref 4.0–10.5)
nRBC: 0 % (ref 0.0–0.2)

## 2019-12-15 LAB — COMPREHENSIVE METABOLIC PANEL
ALT: 46 U/L — ABNORMAL HIGH (ref 0–44)
AST: 33 U/L (ref 15–41)
Albumin: 3.1 g/dL — ABNORMAL LOW (ref 3.5–5.0)
Alkaline Phosphatase: 59 U/L (ref 38–126)
Anion gap: 9 (ref 5–15)
BUN: 22 mg/dL (ref 8–23)
CO2: 26 mmol/L (ref 22–32)
Calcium: 8.5 mg/dL — ABNORMAL LOW (ref 8.9–10.3)
Chloride: 104 mmol/L (ref 98–111)
Creatinine, Ser: 0.86 mg/dL (ref 0.61–1.24)
GFR calc Af Amer: >60 mL/min (ref >60)
GFR calc non Af Amer: >60 mL/min (ref >60)
Glucose, Bld: 140 mg/dL — ABNORMAL HIGH (ref 70–99)
Potassium: 4.8 mmol/L (ref 3.5–5.1)
Sodium: 139 mmol/L (ref 135–145)
Total Bilirubin: 1.1 mg/dL (ref 0.3–1.2)
Total Protein: 5.7 g/dL — ABNORMAL LOW (ref 6.5–8.1)

## 2019-12-15 LAB — D-DIMER, QUANTITATIVE: D-Dimer, Quant: 0.56 ug/mL-FEU — ABNORMAL HIGH (ref 0.00–0.50)

## 2019-12-15 LAB — C-REACTIVE PROTEIN: CRP: 1 mg/dL — ABNORMAL HIGH (ref <1.0)

## 2019-12-15 LAB — MAGNESIUM: Magnesium: 2.2 mg/dL (ref 1.7–2.4)

## 2019-12-15 LAB — BRAIN NATRIURETIC PEPTIDE: B Natriuretic Peptide: 84.8 pg/mL (ref 0.0–100.0)

## 2019-12-15 NOTE — Evaluation (Signed)
Occupational Therapy Evaluation/Discharge Patient Details Name: Derek [name] MRN: [id number] DOB: 02/26/1956 Today's Date: 12/15/2019    History of Present Illness 63 yo male presenting to [facility] hospital on 12/26 for COVID-19 with acute respiratory failure. Transfered to [facility] on 12/14/2019. PMH including polycythemia vera on hydroxyurea for it, GERD, and essential HTN.   Clinical Impression   PTA, pt was living alone and was independent; reports his girlfriend can come stay for support. Pt performing grooming at sink and then mobility in hallway at Mod I level with increased time and standing rest break. SpO2 maintaining >88% on RA throughout with cues for purse lip breathing and taking rest breaks. Provided education and handout on energy conservation for ADLs and IADLs; pt verbalize understanding. Pt near baseline function and provided education in preparation for dc. All acute OT needs met and will sign off.     Follow Up Recommendations  No OT follow up;Supervision - Intermittent    Equipment Recommendations  None recommended by OT    Recommendations for Other Services PT consult     Precautions / Restrictions Precautions Precautions: None Restrictions Weight Bearing Restrictions: No      Mobility Bed Mobility Overal bed mobility: Modified Independent                Transfers Overall transfer level: Modified independent Equipment used: None                  Balance Overall balance assessment: No apparent balance deficits (not formally assessed)                                         ADL either performed or assessed with clinical judgement   ADL Overall ADL's : Modified independent                                       General ADL Comments: Increased time for fatigue with rest breaks for purse lip breathing. SpO2 >88% on RA throughout with cues for purse lip breathing     Vision Baseline Vision/History: Wears  glasses Patient Visual Report: No change from baseline       Perception     Praxis      Pertinent Vitals/Pain Pain Assessment: No/denies pain     Hand Dominance     Extremity/Trunk Assessment Upper Extremity Assessment Upper Extremity Assessment: Overall WFL for tasks assessed   Lower Extremity Assessment Lower Extremity Assessment: Overall WFL for tasks assessed   Cervical / Trunk Assessment Cervical / Trunk Assessment: Normal   Communication Communication Communication: No difficulties   Cognition Arousal/Alertness: Awake/alert Behavior During Therapy: WFL for tasks assessed/performed Overall Cognitive Status: Within Functional Limits for tasks assessed                                     General Comments  SpO2 >88% on RA throughout. Cued for purse lip breathing and pt able to recover with standing rest break    Exercises Exercises: Other exercises Other Exercises Other Exercises: Reviewed use of incentive spirometer Other Exercises: reviewed use of flutter valve   Shoulder Instructions      Home Living Family/patient expects to be discharged to:: Private residence Living Arrangements: Alone Available Help at  Discharge: Other (Comment) Type of Home: House Home Access: Level entry           Bathroom Shower/Tub: Tub/shower unit   Bathroom Toilet: Standard     Home Equipment: Walker - 2 wheels          Prior Functioning/Environment Level of Independence: Independent                 OT Problem List: Decreased activity tolerance;Cardiopulmonary status limiting activity;Decreased knowledge of use of DME or AE;Decreased knowledge of precautions      OT Treatment/Interventions:      OT Goals(Current goals can be found in the care plan section) Acute Rehab OT Goals Patient Stated Goal: to go home and be independent OT Goal Formulation: All assessment and education complete, DC therapy  OT Frequency:     Barriers to D/C:             Co-evaluation              AM-PAC OT "6 Clicks" Daily Activity     Outcome Measure Help from another person eating meals?: None Help from another person taking care of personal grooming?: None Help from another person toileting, which includes using toliet, bedpan, or urinal?: None Help from another person bathing (including washing, rinsing, drying)?: None Help from another person to put on and taking off regular upper body clothing?: None Help from another person to put on and taking off regular lower body clothing?: None 6 Click Score: 24   End of Session Nurse Communication: Mobility status  Activity Tolerance: Patient tolerated treatment well Patient left: in chair;with call bell/phone within reach  OT Visit Diagnosis: Unsteadiness on feet (R26.81);Other abnormalities of gait and mobility (R26.89);Muscle weakness (generalized) (M62.81)                Time: 1444-5848 OT Time Calculation (min): 22 min Charges:  OT General Charges $OT Visit: 1 Visit OT Evaluation $OT Eval Low Complexity: Carlos, OTR/L Acute Rehab Pager: [phone] Office: [place] 12/15/2019, 4:55 PM

## 2019-12-15 NOTE — Progress Notes (Signed)
PROGRESS NOTE                                                                                                                                                                                                             Patient Demographics:    Derek [name], is a 63 y.o. male, DOB - 1956/03/11, MHD:[id number]  Outpatient Primary MD for the patient is Dion Body, MD    LOS - 1  Admit date - 12/14/2019    CC - Cough     Brief Narrative this is a 63 year old Caucasian male with history of polycythemia vera on hydroxyurea for it, GERD, essential hypertension who was admitted to [place] on 12/12/2019 for COVID-19 pneumonia causing acute hypoxic respiratory failure, he was transferred to [facility] for further care on 12/14/2019.   Subjective:    Patient in bed having breakfast, appears comfortable, denies any headache, no fever, no chest pain or pressure, no shortness of breath , no abdominal pain. No focal weakness.    Assessment  & Plan :     1. Acute Hypoxic Resp. Failure due to Acute Covid 19 Viral Pneumonitis during the ongoing 2020 Covid 19 Pandemic - he has mild to moderate disease, started on IV steroids along with remdesivir with good improvement, he is currently on 2 L nasal cannula, and in no distress whatsoever, continue present regimen.  For now have encouraged him to sit in chair in the daytime use I-S and flutter valve for pulmonary toiletry and then prone in bed when at night.  Actemra off label use - patient was told that if COVID-19 pneumonitis gets worse we might potentially use Actemra off label, patient denies any known history of tuberculosis or hepatitis, understands the risks and benefits and wants to proceed with Actemra treatment if required.   SpO2: 93 % O2 Flow Rate (L/min): 1 L/min  Recent Labs  Lab 12/11/19 2348 12/12/19 0313 12/12/19 0507 12/13/19 0556 12/14/19 0740 12/14/19 1440  12/15/19 0344  CRP 16.3*  --  16.4* 7.9* 2.2* 1.6* 1.0*  DDIMER  --   --   --   --   --  0.62* 0.56*  FERRITIN 209 192  --  241 204  --   --   BNP 75.0  --   --   --   --  117.1* 84.8  PROCALCITON 2.46 1.97  --   --   --  0.37  --     Hepatic Function Latest Ref Rng & Units 12/15/2019 12/14/2019 12/13/2019  Total Protein 6.5 - 8.1 g/dL 5.7(L) 5.8(L) 5.7(L)  Albumin 3.5 - 5.0 g/dL 3.1(L) 2.8(L) 2.7(L)  AST 15 - 41 U/L 33 28 23  ALT 0 - 44 U/L 46(H) 31 20  Alk Phosphatase 38 - 126 U/L 59 51 52  Total Bilirubin 0.3 - 1.2 mg/dL 1.1 0.8 0.8    2.  Polycythemia vera with splenomegaly.  Continue hydroxyurea follow with PCP.  3.  Incidental finding of upper T-spine fracture on CTA.  Supportive care.  No pain.  4.  GERD.  PPI.  5.  Hypertension.  On Norvasc.      Condition - Fair  Family Communication  :  None  Code Status :  Full  Diet :   Diet Order            Diet Heart Room service appropriate? Yes; Fluid consistency: Thin  Diet effective now               Disposition Plan  :  Home  Consults  :  None  Procedures  :    CTA - 1.  No evidence of pulmonary embolism. 2. Moderate bilateral patchy airspace process peripheral predominant likely multifocal pneumonia. Tiny amount of bilateral pleural fluid. Minimal reactive hilar adenopathy. 3.  Small pericardial effusion slightly worse compared to 2016. 4.  Moderate splenomegaly. 5. Moderate stable chronic compression fracture over the upper thoracic spine.  PUD Prophylaxis :  PPI  DVT Prophylaxis  :  Lovenox    Lab Results  Component Value Date   PLT 526 (H) 12/15/2019    Inpatient Medications  Scheduled Meds: . amLODipine  5 mg Oral Daily  . aspirin EC  81 mg Oral Daily  . enoxaparin (LOVENOX) injection  40 mg Subcutaneous Q24H  . hydroxyurea  500 mg Oral Daily  . methylPREDNISolone (SOLU-MEDROL) injection  60 mg Intravenous Q12H  . pantoprazole  40 mg Oral Daily   Continuous Infusions: . remdesivir 100  mg in NS 100 mL 100 mg (12/15/19 0815)   PRN Meds:.acetaminophen, albuterol, bisacodyl, ondansetron **OR** ondansetron (ZOFRAN) IV  Antibiotics  :    Anti-infectives (From admission, onward)   Start     Dose/Rate Route Frequency Ordered Stop   12/15/19 1000  remdesivir 100 mg in sodium chloride 0.9 % 100 mL IVPB  Status:  Discontinued     100 mg 200 mL/hr over 30 Minutes Intravenous Daily 12/14/19 1232 12/14/19 1239   12/14/19 1245  remdesivir 200 mg in sodium chloride 0.9% 250 mL IVPB  Status:  Discontinued     200 mg 580 mL/hr over 30 Minutes Intravenous Once 12/14/19 1232 12/14/19 1239   12/14/19 1245  remdesivir 100 mg in sodium chloride 0.9 % 100 mL IVPB     100 mg 200 mL/hr over 30 Minutes Intravenous Daily 12/14/19 1241 12/17/19 0959       Time Spent in minutes  30   Lala Lund M.D on 12/15/2019 at 10:24 AM  To page go to [email or web address] - password [facility]  [facility] -  Office  [phone]    See all Orders from today for further details    Objective:   Vitals:   12/15/19 0718 12/15/19 0750 12/15/19 0751 12/15/19 0755  BP:  121/78  116/80  Pulse:  81  76  Resp:  15  14  Temp:   98.3 F (36.8 C) 97.7 F (36.5 C)  TempSrc:   Oral Oral  SpO2: 93% 93%  93%  Weight:      Height:        Wt Readings from Last 3 Encounters:  12/14/19 86.5 kg  12/14/19 85.7 kg  12/08/19 85.7 kg     Intake/Output Summary (Last 24 hours) at 12/15/2019 1024 Last data filed at 12/15/2019 0600 Gross per 24 hour  Intake 700 ml  Output 970 ml  Net -270 ml     Physical Exam  Awake Alert,  No new F.N deficits, Normal affect [place].AT,PERRAL Supple Neck,No JVD, No cervical lymphadenopathy appriciated.  Symmetrical Chest wall movement, Good air movement bilaterally, CTAB RRR,No Gallops, Rubs or new Murmurs, No Parasternal Heave +ve B.Sounds, Abd Soft, No tenderness, No organomegaly appriciated, No rebound - guarding or rigidity. No Cyanosis, Clubbing or edema, No new  Rash or bruise    Data Review:    CBC Recent Labs  Lab 12/11/19 2216 12/12/19 0507 12/13/19 0556 12/14/19 0740 12/15/19 0344  WBC 5.9 2.9* 3.4* 5.6 5.6  HGB 12.2* 11.0* 11.1* 12.2* 12.3*  HCT 38.0* 35.2* 36.5* 38.6* 41.7  PLT 250 212 321 457* 526*  MCV 89.4 94.4 96.1 90.8 98.3  MCH 28.7 29.5 29.2 28.7 29.0  MCHC 32.1 31.3 30.4 31.6 29.5*  RDW 16.8* 16.7* 16.7* 16.4* 16.4*  LYMPHSABS 0.2* 0.1* 0.2* 0.2* 0.2*  MONOABS 0.1 0.1 0.1 0.2 0.3  EOSABS 0.0 0.0 0.0 0.0 0.0  BASOSABS 0.0 0.0 0.0 0.0 0.0    Chemistries  Recent Labs  Lab 12/11/19 2348 12/12/19 0313 12/12/19 0507 12/13/19 0556 12/14/19 0740 12/15/19 0344  NA 137 137  --  140 140 139  K 3.4* 3.6  --  3.9 4.2 4.8  CL 101 105  --  107 107 104  CO2 23 22  --  '22 24 26  ' GLUCOSE 130* 151*  --  167* 150* 140*  BUN 22 20  --  '21 23 22  ' CREATININE 1.18 0.96  --  0.65 0.75 0.86  CALCIUM 7.8* 7.5*  --  7.9* 8.1* 8.5*  MG  --   --  2.1 2.4 2.1 2.2  AST 27 28  --  23 28 33  ALT 22 20  --  20 31 46*  ALKPHOS 60 53  --  52 51 59  BILITOT 1.1 1.1  --  0.8 0.8 1.1   ------------------------------------------------------------------------------------------------------------------ No results for input(s): CHOL, HDL, LDLCALC, TRIG, CHOLHDL, LDLDIRECT in the last 72 hours.  No results found for: HGBA1C ------------------------------------------------------------------------------------------------------------------ No results for input(s): TSH, T4TOTAL, T3FREE, THYROIDAB in the last 72 hours.  Invalid input(s): FREET3  Cardiac Enzymes No results for input(s): CKMB, TROPONINI, MYOGLOBIN in the last 168 hours.  Invalid input(s): CK ------------------------------------------------------------------------------------------------------------------    Component Value Date/Time   BNP 84.8 12/15/2019 0344    Micro Results Recent Results (from the past 240 hour(s))  Blood Culture (routine x 2)     Status: None  (Preliminary result)   Collection Time: 12/11/19 11:49 PM   Specimen: BLOOD LEFT HAND  Result Value Ref Range Status   Specimen Description BLOOD LEFT HAND  Final   Special Requests   Final    BOTTLES DRAWN AEROBIC AND ANAEROBIC Blood Culture adequate volume   Culture   Final    NO GROWTH 3 DAYS Performed at [facility], [address] [place], [place] [address]    Report Status PENDING  Incomplete  Blood Culture (routine x 2)     Status: None (Preliminary result)   Collection Time: 12/11/19 11:49 PM   Specimen: BLOOD RIGHT HAND  Result Value Ref Range Status   Specimen Description BLOOD RIGHT HAND  Final   Special Requests   Final    BOTTLES DRAWN AEROBIC AND ANAEROBIC Blood Culture adequate volume   Culture   Final    NO GROWTH 3 DAYS Performed at [facility], [address] [place], [place] [address]    Report Status PENDING  Incomplete    Radiology Reports DG Chest 2 View  Addendum Date: 12/08/2019   ADDENDUM REPORT: 12/08/2019 18:10 ADDENDUM: Subtle retrocardiac opacity could also represent developing infection or atelectasis. Electronically Signed   By: Zetta Bills M.D.   On: 12/08/2019 18:10   Result Date: 12/08/2019 CLINICAL DATA:  Cough cough, fever EXAM: CHEST - 2 VIEW COMPARISON:  CT chest of 03/18/2015 FINDINGS: Cardiomediastinal contours are normal. Subtle increased interstitial markings with slight asymmetry. No signs of dense consolidation or effusion. Visualized skeletal structures are unremarkable. IMPRESSION: 1. Subtle increased interstitial markings with slight asymmetry. Findings could represent early viral or atypical pneumonia. 2. No signs of dense consolidation or effusion. Electronically Signed: By: Zetta Bills M.D. On: 12/08/2019 18:07   CT Angio Chest PE W and/or Wo Contrast  Result Date: 12/12/2019 CLINICAL DATA:  Shortness of breath. COVID-19 positive without improvement. Cough and fever. Patient on oral chemotherapy for  cancer. EXAM: CT ANGIOGRAPHY CHEST WITH CONTRAST TECHNIQUE: Multidetector CT imaging of the chest was performed using the standard protocol during bolus administration of intravenous contrast. Multiplanar CT image reconstructions and MIPs were obtained to evaluate the vascular anatomy. CONTRAST:  36m OMNIPAQUE IOHEXOL 350 MG/ML SOLN COMPARISON:  03/18/2015 FINDINGS: Cardiovascular: Heart is normal size. There is a very small amount of pericardial fluid present slightly worse. Subtle calcified plaque over the left anterior descending coronary artery. Thoracic aorta is normal in caliber and demonstrates very minimal calcified plaque. Pulmonary arterial system is well opacified without evidence of emboli. Remaining vascular structures are unremarkable. Mediastinum/Nodes: 1 cm right hilar lymph node and 1.2 cm left hilar lymph node. These are likely reactive. No significant mediastinal adenopathy. Remaining mediastinal structures are unremarkable. Lungs/Pleura: Lungs are adequately inflated demonstrate patchy bilateral peripheral predominant airspace process likely multifocal pneumonia. Tiny amount of bilateral pleural fluid. Airways are normal. Upper Abdomen: Moderate splenomegaly measuring 18.4 cm in greatest diameter. No acute findings. Musculoskeletal: Stable old upper thoracic spine moderate compression fracture. Remainder of the exam is unchanged. Review of the MIP images confirms the above findings. IMPRESSION: 1.  No evidence of pulmonary embolism. 2. Moderate bilateral patchy airspace process peripheral predominant likely multifocal pneumonia. Tiny amount of bilateral pleural fluid. Minimal reactive hilar adenopathy. 3.  Small pericardial effusion slightly worse compared to 2016. 4.  Moderate splenomegaly. 5. Moderate stable chronic compression fracture over the upper thoracic spine. Electronically Signed   By: DMarin OlpM.D.   On: 12/12/2019 06:23   DG Chest Port 1 View  Result Date:  12/11/2019 CLINICAL DATA:  COVID-19 EXAM: PORTABLE CHEST 1 VIEW COMPARISON:  12/08/2019 FINDINGS: Worsening bilateral pulmonary opacities. No pleural effusion or pneumothorax. Normal cardiomediastinal contours. IMPRESSION: Worsening bilateral pulmonary opacities, likely indicating worsening multifocal infection. Electronically Signed   By: KUlyses JarredM.D.   On: 12/11/2019 23:38   ECHOCARDIOGRAM COMPLETE  Result Date: 12/14/2019   ECHOCARDIOGRAM REPORT   Patient Name:   RHANSEL DEVANDate of Exam: 12/14/2019 Medical  Rec #:  [id number]    Height:       69.0 in Accession #:    [id number]   Weight:       189.0 lb Date of Birth:  03-02-1956     BSA:          2.02 m Patient Age:    14 years     BP:           141/85 mmHg Patient Gender: M            HR:           94 bpm. Exam Location:  [facility] Procedure: 2D Echo, Cardiac Doppler and Color Doppler Indications:     Palpitations 785.1  History:         Patient has no prior history of Echocardiogram examinations. No                  cardiac history listed in chart.  Sonographer:     Sherrie Sport RDCS (AE) Referring Phys:  [id number] TAWFIKUL ALAM Diagnosing Phys: Harrell Gave End MD IMPRESSIONS  1. Left ventricular ejection fraction, by visual estimation, is 60 to 65%. The left ventricle has normal function. There is mildly increased left ventricular hypertrophy.  2. The left ventricle has no regional wall motion abnormalities.  3. Global right ventricle has normal systolic function.The right ventricular size is normal. No increase in right ventricular wall thickness.  4. Left atrial size was normal.  5. Right atrial size was normal.  6. Small pericardial effusion.  7. The pericardial effusion is posterior to the left ventricle.  8. The mitral valve is normal in structure. No evidence of mitral valve regurgitation. No evidence of mitral stenosis.  9. The tricuspid valve is normal in structure. 10. The aortic valve is tricuspid. Aortic valve regurgitation is not visualized. No  evidence of aortic valve sclerosis or stenosis. 11. The pulmonic valve was not well visualized. Pulmonic valve regurgitation is not visualized. 12. TR signal is inadequate for assessing pulmonary artery systolic pressure. 13. The interatrial septum was not well visualized. FINDINGS  Left Ventricle: Left ventricular ejection fraction, by visual estimation, is 60 to 65%. The left ventricle has normal function. The left ventricle has no regional wall motion abnormalities. The left ventricular internal cavity size was the left ventricle is normal in size. There is mildly increased left ventricular hypertrophy. Right Ventricle: The right ventricular size is normal. No increase in right ventricular wall thickness. Global RV systolic function is has normal systolic function. Left Atrium: Left atrial size was normal in size. Right Atrium: Right atrial size was normal in size Pericardium: A small pericardial effusion is present. The pericardial effusion is posterior to the left ventricle. Mitral Valve: The mitral valve is normal in structure. No evidence of mitral valve regurgitation. No evidence of mitral valve stenosis by observation. Tricuspid Valve: The tricuspid valve is normal in structure. Tricuspid valve regurgitation is trivial. Aortic Valve: The aortic valve is tricuspid. Aortic valve regurgitation is not visualized. The aortic valve is structurally normal, with no evidence of sclerosis or stenosis. Aortic valve mean gradient measures 4.0 mmHg. Aortic valve peak gradient measures 7.0 mmHg. Aortic valve area, by VTI measures 3.16 cm. Pulmonic Valve: The pulmonic valve was not well visualized. Pulmonic valve regurgitation is not visualized. Pulmonic regurgitation is not visualized. No evidence of pulmonic stenosis. Aorta: The aortic root and ascending aorta are structurally normal, with no evidence of dilitation. Pulmonary Artery: The pulmonary artery is of normal  size and origin. Venous: The inferior vena cava was  not well visualized. IAS/Shunts: The interatrial septum was not well visualized.  LEFT VENTRICLE PLAX 2D LVIDd:         4.49 cm  Diastology LVIDs:         2.68 cm  LV e' lateral:   10.70 cm/s LV PW:         1.13 cm  LV E/e' lateral: 8.1 LV IVS:        1.16 cm  LV e' medial:    7.51 cm/s LVOT diam:     2.10 cm  LV E/e' medial:  11.5 LV SV:         65 ml LV SV Index:   31.75 LVOT Area:     3.46 cm  RIGHT VENTRICLE RV S prime:     16.30 cm/s TAPSE (M-mode): 3.5 cm LEFT ATRIUM         Index      RIGHT ATRIUM           Index LA diam:    3.90 cm 1.93 cm/m RA Area:     14.35 cm 7.11 cm/m  AORTIC VALVE                   PULMONIC VALVE AV Area (Vmax):    3.28 cm    PV Vmax:        0.94 m/s AV Area (Vmean):   3.11 cm    PV Peak grad:   3.5 mmHg AV Area (VTI):     3.16 cm    RVOT Peak grad: 4 mmHg AV Vmax:           132.00 cm/s AV Vmean:          95.000 cm/s AV VTI:            0.263 m AV Peak Grad:      7.0 mmHg AV Mean Grad:      4.0 mmHg LVOT Vmax:         125.00 cm/s LVOT Vmean:        85.400 cm/s LVOT VTI:          0.240 m LVOT/AV VTI ratio: 0.91  AORTA Ao Root diam: 3.00 cm MITRAL VALVE MV Area (PHT): 3.20 cm             SHUNTS MV PHT:        68.73 msec           Systemic VTI:  0.24 m MV Decel Time: 237 msec             Systemic Diam: 2.10 cm MV E velocity: 86.30 cm/s 103 cm/s MV A velocity: 77.60 cm/s 70.3 cm/s MV E/A ratio:  1.11       1.5  Harrell Gave End MD Electronically signed by Nelva Bush MD Signature Date/Time: 12/14/2019/6:37:05 PM    Final    US SPLEEN (ABDOMEN LIMITED)  Result Date: 11/25/2019 CLINICAL DATA:  History of polycythemia EXAM: ULTRASOUND ABDOMEN LIMITED COMPARISON:  12/26/2018 FINDINGS: Spleen is mildly enlarged measuring 16.9 cm in greatest length. This is stable in appearance from the prior exam. Calculated volume is 1142.9 mL this is relatively stable from the prior study. No focal splenic lesion is noted. IMPRESSION: Splenomegaly stable from the prior exam. Electronically  Signed   By: Inez Catalina M.D.   On: 11/25/2019 15:17

## 2019-12-15 NOTE — Progress Notes (Signed)
Updated sister

## 2019-12-15 NOTE — Progress Notes (Signed)
PHYSICAL THERAPY EVALUATION  CLINICAL IMPRESSION: Pt admitted with above diagnosis. PTA was living home alone independently, states he sometimes has a girlfriend. Pt currently with functional limitations due to the deficits listed below (see PT Problem List). Pt is needing SBA and line management assist only this am, he was able to ambulate approx 248ft with SBA and no AD, on 1L/min via [place], 02 saturation ranges in low to mid 90s throughout session. Noted that with ambulation pt had increased bout of coughing. Pt will benefit from skilled PT to increase their independence and safety with mobility to allow discharge to the venue listed below.       12/15/19 1000  PT Visit Information  Last PT Received On 12/15/19  Assistance Needed +1  History of Present Illness 63 yo male presenting to [facility] hospital on 12/26 for COVID-19 with acute respiratory failure. Transfered to [facility] on 12/14/2019. PMH including polycythemia vera on hydroxyurea for it, GERD, and essential HTN.  Subjective Data  Patient Stated Goal to go home and be independent  Precautions  Precautions None  Restrictions  Weight Bearing Restrictions No  Pain Assessment  Pain Assessment 0-10  Pain Score 0  Cognition  Arousal/Alertness Awake/alert  Behavior During Therapy WFL for tasks assessed/performed  Overall Cognitive Status Within Functional Limits for tasks assessed  Bed Mobility  Overal bed mobility Modified Independent  Transfers  Overall transfer level Modified independent  Equipment used None  Ambulation/Gait  Ambulation/Gait assistance Supervision  Gait Distance (Feet) 200 Feet  Assistive device None  Gait Pattern/deviations Step-through pattern  General Gait Details ambulated w/ SBA no AD and on 1L/min via [place] sats in 90s throughout  Balance  Overall balance assessment No apparent balance deficits (not formally assessed)  Exercises  Exercises Other exercises  Other Exercises  Other Exercises incentive  spirometer x 5 pulls 563ml  Other Exercises flutter valve x 5 w/ cues  PT - End of Session  Equipment Utilized During Treatment Oxygen  Activity Tolerance Patient tolerated treatment well  Patient left in chair;with call bell/phone within reach  Nurse Communication Mobility status   PT - Assessment/Plan  PT Visit Diagnosis Other abnormalities of gait and mobility (R26.89)  PT Frequency (ACUTE ONLY) Min 3X/week  Follow Up Recommendations No PT follow up  PT equipment None recommended by PT  AM-PAC PT "6 Clicks" Mobility Outcome Measure (Version 2)  Help needed turning from your back to your side while in a flat bed without using bedrails? 4  Help needed moving from lying on your back to sitting on the side of a flat bed without using bedrails? 4  Help needed moving to and from a bed to a chair (including a wheelchair)? 4  Help needed standing up from a chair using your arms (e.g., wheelchair or bedside chair)? 4  Help needed to walk in hospital room? 3  Help needed climbing 3-5 steps with a railing?  3  6 Click Score 22  Consider Recommendation of Discharge To: Home with no services  Acute Rehab PT Goals  PT Goal Formulation With patient  Time For Goal Achievement 12/29/19  Potential to Achieve Goals Good  PT Time Calculation  PT Start Time (ACUTE ONLY) 1014  PT Stop Time (ACUTE ONLY) 1032  PT Time Calculation (min) (ACUTE ONLY) 18 min  PT General Charges  $$ ACUTE PT VISIT 1 Visit  PT Evaluation  $PT Eval Moderate Complexity 1 Mod    Horald Chestnut, PT

## 2019-12-16 DIAGNOSIS — I10 Essential (primary) hypertension: Secondary | ICD-10-CM

## 2019-12-16 DIAGNOSIS — J9601 Acute respiratory failure with hypoxia: Secondary | ICD-10-CM

## 2019-12-16 LAB — CBC WITH DIFFERENTIAL/PLATELET
Abs Immature Granulocytes: 0.1 10*3/uL — ABNORMAL HIGH (ref 0.00–0.07)
Basophils Absolute: 0 10*3/uL (ref 0.0–0.1)
Basophils Relative: 0 %
Eosinophils Absolute: 0 10*3/uL (ref 0.0–0.5)
Eosinophils Relative: 0 %
HCT: 41.9 % (ref 39.0–52.0)
Hemoglobin: 12.4 g/dL — ABNORMAL LOW (ref 13.0–17.0)
Immature Granulocytes: 1 %
Lymphocytes Relative: 3 %
Lymphs Abs: 0.2 10*3/uL — ABNORMAL LOW (ref 0.7–4.0)
MCH: 29.4 pg (ref 26.0–34.0)
MCHC: 29.6 g/dL — ABNORMAL LOW (ref 30.0–36.0)
MCV: 99.3 fL (ref 80.0–100.0)
Monocytes Absolute: 0.4 10*3/uL (ref 0.1–1.0)
Monocytes Relative: 5 %
Neutro Abs: 7.9 10*3/uL — ABNORMAL HIGH (ref 1.7–7.7)
Neutrophils Relative %: 91 %
Platelets: 572 10*3/uL — ABNORMAL HIGH (ref 150–400)
RBC: 4.22 MIL/uL (ref 4.22–5.81)
RDW: 16.8 % — ABNORMAL HIGH (ref 11.5–15.5)
WBC: 8.6 10*3/uL (ref 4.0–10.5)
nRBC: 0.2 % (ref 0.0–0.2)

## 2019-12-16 LAB — COMPREHENSIVE METABOLIC PANEL
ALT: 40 U/L (ref 0–44)
AST: 20 U/L (ref 15–41)
Albumin: 3 g/dL — ABNORMAL LOW (ref 3.5–5.0)
Alkaline Phosphatase: 58 U/L (ref 38–126)
Anion gap: 8 (ref 5–15)
BUN: 24 mg/dL — ABNORMAL HIGH (ref 8–23)
CO2: 24 mmol/L (ref 22–32)
Calcium: 8.5 mg/dL — ABNORMAL LOW (ref 8.9–10.3)
Chloride: 107 mmol/L (ref 98–111)
Creatinine, Ser: 0.82 mg/dL (ref 0.61–1.24)
GFR calc Af Amer: >60 mL/min (ref >60)
GFR calc non Af Amer: >60 mL/min (ref >60)
Glucose, Bld: 145 mg/dL — ABNORMAL HIGH (ref 70–99)
Potassium: 4.9 mmol/L (ref 3.5–5.1)
Sodium: 139 mmol/L (ref 135–145)
Total Bilirubin: 1 mg/dL (ref 0.3–1.2)
Total Protein: 5.6 g/dL — ABNORMAL LOW (ref 6.5–8.1)

## 2019-12-16 LAB — MAGNESIUM: Magnesium: 2.2 mg/dL (ref 1.7–2.4)

## 2019-12-16 LAB — BRAIN NATRIURETIC PEPTIDE: B Natriuretic Peptide: 54.2 pg/mL (ref 0.0–100.0)

## 2019-12-16 LAB — C-REACTIVE PROTEIN: CRP: 1 mg/dL — ABNORMAL HIGH (ref <1.0)

## 2019-12-16 LAB — D-DIMER, QUANTITATIVE: D-Dimer, Quant: 0.64 ug/mL-FEU — ABNORMAL HIGH (ref 0.00–0.50)

## 2019-12-16 MED ORDER — DEXAMETHASONE 6 MG PO TABS
6.0000 mg | ORAL_TABLET | Freq: Every day | ORAL | Status: DC
  Administered 2019-12-16 – 2019-12-17 (×2): 6 mg via ORAL
  Filled 2019-12-16 (×3): qty 1

## 2019-12-16 NOTE — Progress Notes (Signed)
[facility] PROGRESS NOTE    Progress Note  Derek [name]  [id number] DOB: 02/15/1956 DOA: 12/14/2019 PCP: Dion Body, MD     Brief Narrative:   Derek [name] is an 63 y.o. male past medical history of polycythemia vera on hydroxyurea, essential hypertension was admitted at [facility] on 12/12/2019 for COVID-19 pneumonia there he was found to be in acute respiratory failure and transferred to [facility] on 12/14/2019  Assessment/Plan:   Acute respiratory failure with hypoxia secondary to COVID-19 pneumonia: Weaned down to room air, currently satting greater than 94%. Today will be his fifth day of IV remdesivir and steroids, will change Solu-Medrol to dexamethasone.  He was given a single dose of Actemra on 12/12/2019. Inflammatory markers continue to improve in a day by day basis. Try to keep the patient peripherally 16 hours a day, would not prone out of bed to chair. Continue strict I's and O's and daily weights. Will monitor for an additional 24 hours he was taken off oxygen this morning.  Polycythemia vera ([place]) Continue hydroxyurea as an outpatient.  Essential hypertension: continue current regimen.  Incidental finding in the upper T spine compression fracture: Currently asymptomatic with no pain continue supportive care.  GERD: Continue PPI.    DVT prophylaxis: lovenox Family Communication:none Disposition Plan/Barrier to D/C: home in am Code Status:     Code Status Orders  (From admission, onward)         Start     Ordered   12/14/19 1231  Full code  Continuous     12/14/19 1232        Code Status History    Date Active Date Inactive Code Status Order ID Comments User Context   12/12/2019 0251 12/14/2019 1226 Full Code [id number]  Toy Baker, MD ED   Advance Care Planning Activity    Advance Directive Documentation     Most Recent Value  Type of Advance Directive  Healthcare Power of [place], Living will  Pre-existing out  of facility DNR order (yellow form or pink MOST form)  --  "MOST" Form in Place?  --        IV Access:    Peripheral IV   Procedures and diagnostic studies:   ECHOCARDIOGRAM COMPLETE  Result Date: 12/14/2019   ECHOCARDIOGRAM REPORT   Patient Name:   Derek [name] Date of Exam: 12/14/2019 Medical Rec #:  [id number]    Height:       69.0 in Accession #:    [id number]   Weight:       189.0 lb Date of Birth:  [birth year]     BSA:          2.02 m Patient Age:    22 years     BP:           141/85 mmHg Patient Gender: M            HR:           94 bpm. Exam Location:  [facility] Procedure: 2D Echo, Cardiac Doppler and Color Doppler Indications:     Palpitations 785.1  History:         Patient has no prior history of Echocardiogram examinations. No                  cardiac history listed in chart.  Sonographer:     Sherrie Sport RDCS (AE) Referring Phys:  [id number] TAWFIKUL ALAM Diagnosing Phys: Harrell Gave End MD IMPRESSIONS  1. Left ventricular ejection fraction, by visual  estimation, is 60 to 65%. The left ventricle has normal function. There is mildly increased left ventricular hypertrophy.  2. The left ventricle has no regional wall motion abnormalities.  3. Global right ventricle has normal systolic function.The right ventricular size is normal. No increase in right ventricular wall thickness.  4. Left atrial size was normal.  5. Right atrial size was normal.  6. Small pericardial effusion.  7. The pericardial effusion is posterior to the left ventricle.  8. The mitral valve is normal in structure. No evidence of mitral valve regurgitation. No evidence of mitral stenosis.  9. The tricuspid valve is normal in structure. 10. The aortic valve is tricuspid. Aortic valve regurgitation is not visualized. No evidence of aortic valve sclerosis or stenosis. 11. The pulmonic valve was not well visualized. Pulmonic valve regurgitation is not visualized. 12. TR signal is inadequate for assessing pulmonary artery systolic  pressure. 13. The interatrial septum was not well visualized. FINDINGS  Left Ventricle: Left ventricular ejection fraction, by visual estimation, is 60 to 65%. The left ventricle has normal function. The left ventricle has no regional wall motion abnormalities. The left ventricular internal cavity size was the left ventricle is normal in size. There is mildly increased left ventricular hypertrophy. Right Ventricle: The right ventricular size is normal. No increase in right ventricular wall thickness. Global RV systolic function is has normal systolic function. Left Atrium: Left atrial size was normal in size. Right Atrium: Right atrial size was normal in size Pericardium: A small pericardial effusion is present. The pericardial effusion is posterior to the left ventricle. Mitral Valve: The mitral valve is normal in structure. No evidence of mitral valve regurgitation. No evidence of mitral valve stenosis by observation. Tricuspid Valve: The tricuspid valve is normal in structure. Tricuspid valve regurgitation is trivial. Aortic Valve: The aortic valve is tricuspid. Aortic valve regurgitation is not visualized. The aortic valve is structurally normal, with no evidence of sclerosis or stenosis. Aortic valve mean gradient measures 4.0 mmHg. Aortic valve peak gradient measures 7.0 mmHg. Aortic valve area, by VTI measures 3.16 cm. Pulmonic Valve: The pulmonic valve was not well visualized. Pulmonic valve regurgitation is not visualized. Pulmonic regurgitation is not visualized. No evidence of pulmonic stenosis. Aorta: The aortic root and ascending aorta are structurally normal, with no evidence of dilitation. Pulmonary Artery: The pulmonary artery is of normal size and origin. Venous: The inferior vena cava was not well visualized. IAS/Shunts: The interatrial septum was not well visualized.  LEFT VENTRICLE PLAX 2D LVIDd:         4.49 cm  Diastology LVIDs:         2.68 cm  LV e' lateral:   10.70 cm/s LV PW:         1.13  cm  LV E/e' lateral: 8.1 LV IVS:        1.16 cm  LV e' medial:    7.51 cm/s LVOT diam:     2.10 cm  LV E/e' medial:  11.5 LV SV:         65 ml LV SV Index:   31.75 LVOT Area:     3.46 cm  RIGHT VENTRICLE RV S prime:     16.30 cm/s TAPSE (M-mode): 3.5 cm LEFT ATRIUM         Index      RIGHT ATRIUM           Index LA diam:    3.90 cm 1.93 cm/m RA Area:  14.35 cm 7.11 cm/m  AORTIC VALVE                   PULMONIC VALVE AV Area (Vmax):    3.28 cm    PV Vmax:        0.94 m/s AV Area (Vmean):   3.11 cm    PV Peak grad:   3.5 mmHg AV Area (VTI):     3.16 cm    RVOT Peak grad: 4 mmHg AV Vmax:           132.00 cm/s AV Vmean:          95.000 cm/s AV VTI:            0.263 m AV Peak Grad:      7.0 mmHg AV Mean Grad:      4.0 mmHg LVOT Vmax:         125.00 cm/s LVOT Vmean:        85.400 cm/s LVOT VTI:          0.240 m LVOT/AV VTI ratio: 0.91  AORTA Ao Root diam: 3.00 cm MITRAL VALVE MV Area (PHT): 3.20 cm             SHUNTS MV PHT:        68.73 msec           Systemic VTI:  0.24 m MV Decel Time: 237 msec             Systemic Diam: 2.10 cm MV E velocity: 86.30 cm/s 103 cm/s MV A velocity: 77.60 cm/s 70.3 cm/s MV E/A ratio:  1.11       1.5  Harrell Gave End MD Electronically signed by Nelva Bush MD Signature Date/Time: 12/14/2019/6:37:05 PM    Final      Medical Consultants:    None.  Anti-Infectives:   none  Subjective:    Derek [name] he relates he feels great he is slightly anorexic but he relates his breathing is close to baseline.  Objective:    Vitals:   12/15/19 1952 12/15/19 2350 12/16/19 0444 12/16/19 0723  BP: 129/85 125/83 123/79 126/84  Pulse: 81 79 70 71  Resp: 16 19 20  (!) 23  Temp: 97.9 F (36.6 C) 97.9 F (36.6 C) 98 F (36.7 C) 97.8 F (36.6 C)  TempSrc: Axillary Axillary Axillary Oral  SpO2: 97% 94% 99% 98%  Weight:      Height:       SpO2: 98 % O2 Flow Rate (L/min): 1 L/min   Intake/Output Summary (Last 24 hours) at 12/16/2019 0803 Last data filed at  12/16/2019 0700 Gross per 24 hour  Intake 1060 ml  Output 1390 ml  Net -330 ml   Filed Weights   12/14/19 1643  Weight: 86.5 kg    Exam: General exam: In no acute distress. Respiratory system: Good air movement and clear to auscultation. Cardiovascular system: S1 & S2 heard, RRR. No JVD. Gastrointestinal system: Abdomen is nondistended, soft and nontender.  Central nervous system: Alert and oriented. No focal neurological deficits. Extremities: No pedal edema. Skin: No rashes, lesions or ulcers Psychiatry: Judgement and insight appear normal. Mood & affect appropriate.    Data Reviewed:    Labs: Basic Metabolic Panel: Recent Labs  Lab 12/12/19 0313 12/12/19 0507 12/13/19 0556 12/14/19 0740 12/15/19 0344 12/16/19 0355  NA 137  --  140 140 139 139  K 3.6  --  3.9 4.2 4.8 4.9  CL 105  --  107 107  104 107  CO2 22  --  22 24 26 24   GLUCOSE 151*  --  167* 150* 140* 145*  BUN 20  --  21 23 22  24*  CREATININE 0.96  --  0.65 0.75 0.86 0.82  CALCIUM 7.5*  --  7.9* 8.1* 8.5* 8.5*  MG  --  2.1 2.4 2.1 2.2 2.2  PHOS  --  2.7  --   --   --   --    GFR Estimated Creatinine Clearance: 97.8 mL/min (by C-G formula based on SCr of 0.82 mg/dL). Liver Function Tests: Recent Labs  Lab 12/12/19 0313 12/13/19 0556 12/14/19 0740 12/15/19 0344 12/16/19 0355  AST 28 23 28  33 20  ALT 20 20 31  46* 40  ALKPHOS 53 52 51 59 58  BILITOT 1.1 0.8 0.8 1.1 1.0  PROT 5.8* 5.7* 5.8* 5.7* 5.6*  ALBUMIN 2.8* 2.7* 2.8* 3.1* 3.0*   No results for input(s): LIPASE, AMYLASE in the last 168 hours. No results for input(s): AMMONIA in the last 168 hours. Coagulation profile No results for input(s): INR, PROTIME in the last 168 hours. COVID-19 Labs  Recent Labs    12/14/19 0740 12/14/19 1440 12/15/19 0344 12/16/19 0355  DDIMER  --  0.62* 0.56* 0.64*  FERRITIN 204  --   --   --   CRP 2.2* 1.6* 1.0*  --     No results found for: SARSCOV2NAA  CBC: Recent Labs  Lab 12/12/19 0507  12/13/19 0556 12/14/19 0740 12/15/19 0344 12/16/19 0355  WBC 2.9* 3.4* 5.6 5.6 8.6  NEUTROABS 2.7 3.1 5.1 5.1 7.9*  HGB 11.0* 11.1* 12.2* 12.3* 12.4*  HCT 35.2* 36.5* 38.6* 41.7 41.9  MCV 94.4 96.1 90.8 98.3 99.3  PLT 212 321 457* 526* 572*   Cardiac Enzymes: No results for input(s): CKTOTAL, CKMB, CKMBINDEX, TROPONINI in the last 168 hours. BNP (last 3 results) No results for input(s): PROBNP in the last 8760 hours. CBG: No results for input(s): GLUCAP in the last 168 hours. D-Dimer: Recent Labs    12/15/19 0344 12/16/19 0355  DDIMER 0.56* 0.64*   Hgb A1c: No results for input(s): HGBA1C in the last 72 hours. Lipid Profile: No results for input(s): CHOL, HDL, LDLCALC, TRIG, CHOLHDL, LDLDIRECT in the last 72 hours. Thyroid function studies: No results for input(s): TSH, T4TOTAL, T3FREE, THYROIDAB in the last 72 hours.  Invalid input(s): FREET3 Anemia work up: Recent Labs    12/14/19 0740  FERRITIN 204   Sepsis Labs: Recent Labs  Lab 12/11/19 2348 12/12/19 0313 12/13/19 0556 12/14/19 0740 12/14/19 1440 12/15/19 0344 12/16/19 0355  PROCALCITON 2.46 1.97  --   --  0.37  --   --   WBC  --   --  3.4* 5.6  --  5.6 8.6  LATICACIDVEN 1.2  --   --   --   --   --   --    Microbiology Recent Results (from the past 240 hour(s))  Blood Culture (routine x 2)     Status: None (Preliminary result)   Collection Time: 12/11/19 11:49 PM   Specimen: BLOOD LEFT HAND  Result Value Ref Range Status   Specimen Description BLOOD LEFT HAND  Final   Special Requests   Final    BOTTLES DRAWN AEROBIC AND ANAEROBIC Blood Culture adequate volume   Culture   Final    NO GROWTH 3 DAYS Performed at [facility], [address] [place], [place] [address]    Report Status PENDING  Incomplete  Blood Culture (routine x 2)     Status: None (Preliminary result)   Collection Time: 12/11/19 11:49 PM   Specimen: BLOOD RIGHT HAND  Result Value Ref Range Status   Specimen  Description BLOOD RIGHT HAND  Final   Special Requests   Final    BOTTLES DRAWN AEROBIC AND ANAEROBIC Blood Culture adequate volume   Culture   Final    NO GROWTH 3 DAYS Performed at [facility], [address] [place], [place] [address]    Report Status PENDING  Incomplete     Medications:    amLODipine  5 mg Oral Daily   aspirin EC  81 mg Oral Daily   enoxaparin (LOVENOX) injection  40 mg Subcutaneous Q24H   hydroxyurea  500 mg Oral Daily   methylPREDNISolone (SOLU-MEDROL) injection  60 mg Intravenous Q12H   pantoprazole  40 mg Oral Daily   Continuous Infusions:  remdesivir 100 mg in NS 100 mL 100 mg (12/15/19 0815)      LOS: 2 days   Charlynne Cousins  [facility]  12/16/2019, 8:03 AM

## 2019-12-17 LAB — CBC WITH DIFFERENTIAL/PLATELET
Abs Immature Granulocytes: 0.27 10*3/uL — ABNORMAL HIGH (ref 0.00–0.07)
Basophils Absolute: 0 10*3/uL (ref 0.0–0.1)
Basophils Relative: 0 %
Eosinophils Absolute: 0 10*3/uL (ref 0.0–0.5)
Eosinophils Relative: 0 %
HCT: 41 % (ref 39.0–52.0)
Hemoglobin: 12.3 g/dL — ABNORMAL LOW (ref 13.0–17.0)
Immature Granulocytes: 3 %
Lymphocytes Relative: 3 %
Lymphs Abs: 0.3 10*3/uL — ABNORMAL LOW (ref 0.7–4.0)
MCH: 29.9 pg (ref 26.0–34.0)
MCHC: 30 g/dL (ref 30.0–36.0)
MCV: 99.8 fL (ref 80.0–100.0)
Monocytes Absolute: 0.5 10*3/uL (ref 0.1–1.0)
Monocytes Relative: 5 %
Neutro Abs: 9.2 10*3/uL — ABNORMAL HIGH (ref 1.7–7.7)
Neutrophils Relative %: 89 %
Platelets: 585 10*3/uL — ABNORMAL HIGH (ref 150–400)
RBC: 4.11 MIL/uL — ABNORMAL LOW (ref 4.22–5.81)
RDW: 17.3 % — ABNORMAL HIGH (ref 11.5–15.5)
WBC: 10.2 10*3/uL (ref 4.0–10.5)
nRBC: 0.3 % — ABNORMAL HIGH (ref 0.0–0.2)

## 2019-12-17 LAB — COMPREHENSIVE METABOLIC PANEL
ALT: 38 U/L (ref 0–44)
AST: 19 U/L (ref 15–41)
Albumin: 2.9 g/dL — ABNORMAL LOW (ref 3.5–5.0)
Alkaline Phosphatase: 58 U/L (ref 38–126)
Anion gap: 10 (ref 5–15)
BUN: 24 mg/dL — ABNORMAL HIGH (ref 8–23)
CO2: 24 mmol/L (ref 22–32)
Calcium: 8.3 mg/dL — ABNORMAL LOW (ref 8.9–10.3)
Chloride: 105 mmol/L (ref 98–111)
Creatinine, Ser: 0.77 mg/dL (ref 0.61–1.24)
GFR calc Af Amer: >60 mL/min (ref >60)
GFR calc non Af Amer: >60 mL/min (ref >60)
Glucose, Bld: 112 mg/dL — ABNORMAL HIGH (ref 70–99)
Potassium: 4.8 mmol/L (ref 3.5–5.1)
Sodium: 139 mmol/L (ref 135–145)
Total Bilirubin: 1 mg/dL (ref 0.3–1.2)
Total Protein: 5.1 g/dL — ABNORMAL LOW (ref 6.5–8.1)

## 2019-12-17 LAB — CULTURE, BLOOD (ROUTINE X 2)
Culture: NO GROWTH
Culture: NO GROWTH
Special Requests: ADEQUATE
Special Requests: ADEQUATE

## 2019-12-17 LAB — MAGNESIUM: Magnesium: 2.2 mg/dL (ref 1.7–2.4)

## 2019-12-17 LAB — BRAIN NATRIURETIC PEPTIDE: B Natriuretic Peptide: 61.6 pg/mL (ref 0.0–100.0)

## 2019-12-17 LAB — D-DIMER, QUANTITATIVE: D-Dimer, Quant: 0.52 ug/mL-FEU — ABNORMAL HIGH (ref 0.00–0.50)

## 2019-12-17 LAB — C-REACTIVE PROTEIN: CRP: 0.7 mg/dL (ref <1.0)

## 2019-12-17 NOTE — Discharge Summary (Signed)
Physician Discharge Summary  Derek [name] [id number] DOB: [birth year] DOA: 12/14/2019  PCP: Dion Body, MD  Admit date: 12/14/2019 Discharge date: 12/17/2019  Admitted From: Home Disposition:  Home  Recommendations for Outpatient Follow-up:  1. Follow up with PCP in 1-2 weeks 2. Please obtain BMP/CBC in one week   Home Health:No Equipment/Devices:None  Discharge Condition:Stable CODE STATUS:Full Diet recommendation: Heart Healthy  Brief/Interim Summary: 63 y.o. male past medical history of polycythemia vera on hydroxyurea, essential hypertension was admitted at [facility] on 12/12/2019 for COVID-19 pneumonia there he was found to be in acute respiratory failure and transferred to [facility] on 12/14/2019   Discharge Diagnoses:  Active Problems:   Polycythemia vera ([place])   Essential hypertension   Acute respiratory failure with hypoxia (HCC)   Acute hypoxemic respiratory failure due to COVID-19 [facility])   COVID-19 virus infection Acute respiratory failure with hypoxia secondarily to COVID-19 pneumonia: Is placed on supplemental oxygen on admission, he received 5-day course of IV remdesivir and 6-day course of steroids he was weaned down to room air he did receive Actemra on 12/12/2019. His inflammatory markers improved drastically the patient did a good job proning himself. He was discharged in stable condition of oxygen physical therapy evaluated the patient and recommended no home health PT.  Polycythemia vera : No changes made to his medication.  Essential hypertension: Continue current regimen.  Incidental finding in the upper CT spine compression fracture: Asymptomatic currently no pain.  GERD: Continue PPI.   Discharge Instructions  Discharge Instructions    Diet - low sodium heart healthy   Complete by: As directed    Increase activity slowly   Complete by: As directed      Allergies as of 12/17/2019      Reactions   Penicillin G    Other  reaction(s): Localized superficial swelling of skin      Medication List    TAKE these medications   Acetaminophen-Codeine 300-30 MG tablet Take 1 tablet by mouth every 8 (eight) hours.   amLODipine 5 MG tablet Commonly known as: NORVASC Take 5 mg daily by mouth.   aspirin EC 81 MG tablet Take 81 mg daily by mouth.   azithromycin 250 MG tablet Commonly known as: Zithromax Z-Pak Take 2 tablets (500 mg) on  Day 1,  followed by 1 tablet (250 mg) once daily on Days 2 through 5.   hydroxyurea 500 MG capsule Commonly known as: HYDREA TAKE 1 TABLET BY MOUTH IN THE AM AND PM MON-FRI AND TAKE 1 TABLET IN THE AM AND 2 TAB IN THE PM SAT-SUN   MULTIVITAMIN ADULT PO Take 1 tablet by mouth 1 day or 1 dose.   NEXIUM PO Take 1 capsule by mouth daily.   ondansetron 4 MG disintegrating tablet Commonly known as: Zofran ODT Take 1 tablet (4 mg total) by mouth every 8 (eight) hours as needed for nausea or vomiting.   Vitamin D-1000 Max St 25 MCG (1000 UT) tablet Generic drug: Cholecalciferol Take 1,000 Units daily by mouth.       Allergies  Allergen Reactions  . Penicillin G     Other reaction(s): Localized superficial swelling of skin    Consultations:  None   Procedures/Studies: DG Chest 2 View  Addendum Date: 12/08/2019   ADDENDUM REPORT: 12/08/2019 18:10 ADDENDUM: Subtle retrocardiac opacity could also represent developing infection or atelectasis. Electronically Signed   By: Zetta Bills M.D.   On: 12/08/2019 18:10   Result Date: 12/08/2019 CLINICAL DATA:  Cough  cough, fever EXAM: CHEST - 2 VIEW COMPARISON:  CT chest of 03/18/2015 FINDINGS: Cardiomediastinal contours are normal. Subtle increased interstitial markings with slight asymmetry. No signs of dense consolidation or effusion. Visualized skeletal structures are unremarkable. IMPRESSION: 1. Subtle increased interstitial markings with slight asymmetry. Findings could represent early viral or atypical pneumonia. 2. No  signs of dense consolidation or effusion. Electronically Signed: By: Zetta Bills M.D. On: 12/08/2019 18:07   CT Angio Chest PE W and/or Wo Contrast  Result Date: 12/12/2019 CLINICAL DATA:  Shortness of breath. COVID-19 positive without improvement. Cough and fever. Patient on oral chemotherapy for cancer. EXAM: CT ANGIOGRAPHY CHEST WITH CONTRAST TECHNIQUE: Multidetector CT imaging of the chest was performed using the standard protocol during bolus administration of intravenous contrast. Multiplanar CT image reconstructions and MIPs were obtained to evaluate the vascular anatomy. CONTRAST:  66mL OMNIPAQUE IOHEXOL 350 MG/ML SOLN COMPARISON:  03/18/2015 FINDINGS: Cardiovascular: Heart is normal size. There is a very small amount of pericardial fluid present slightly worse. Subtle calcified plaque over the left anterior descending coronary artery. Thoracic aorta is normal in caliber and demonstrates very minimal calcified plaque. Pulmonary arterial system is well opacified without evidence of emboli. Remaining vascular structures are unremarkable. Mediastinum/Nodes: 1 cm right hilar lymph node and 1.2 cm left hilar lymph node. These are likely reactive. No significant mediastinal adenopathy. Remaining mediastinal structures are unremarkable. Lungs/Pleura: Lungs are adequately inflated demonstrate patchy bilateral peripheral predominant airspace process likely multifocal pneumonia. Tiny amount of bilateral pleural fluid. Airways are normal. Upper Abdomen: Moderate splenomegaly measuring 18.4 cm in greatest diameter. No acute findings. Musculoskeletal: Stable old upper thoracic spine moderate compression fracture. Remainder of the exam is unchanged. Review of the MIP images confirms the above findings. IMPRESSION: 1.  No evidence of pulmonary embolism. 2. Moderate bilateral patchy airspace process peripheral predominant likely multifocal pneumonia. Tiny amount of bilateral pleural fluid. Minimal reactive hilar  adenopathy. 3.  Small pericardial effusion slightly worse compared to 2016. 4.  Moderate splenomegaly. 5. Moderate stable chronic compression fracture over the upper thoracic spine. Electronically Signed   By: Marin Olp M.D.   On: 12/12/2019 06:23   DG Chest Port 1 View  Result Date: 12/11/2019 CLINICAL DATA:  COVID-19 EXAM: PORTABLE CHEST 1 VIEW COMPARISON:  12/08/2019 FINDINGS: Worsening bilateral pulmonary opacities. No pleural effusion or pneumothorax. Normal cardiomediastinal contours. IMPRESSION: Worsening bilateral pulmonary opacities, likely indicating worsening multifocal infection. Electronically Signed   By: Ulyses Jarred M.D.   On: 12/11/2019 23:38   ECHOCARDIOGRAM COMPLETE  Result Date: 12/14/2019   ECHOCARDIOGRAM REPORT   Patient Name:   Derek [name] Date of Exam: 12/14/2019 Medical Rec #:  [id number]    Height:       69.0 in Accession #:    [id number]   Weight:       189.0 lb Date of Birth:  06/14/1956     BSA:          2.02 m Patient Age:    17 years     BP:           141/85 mmHg Patient Gender: M            HR:           94 bpm. Exam Location:  [facility] Procedure: 2D Echo, Cardiac Doppler and Color Doppler Indications:     Palpitations 785.1  History:         Patient has no prior history of Echocardiogram examinations. No  cardiac history listed in chart.  Sonographer:     Sherrie Sport RDCS (AE) Referring Phys:  [id number] TAWFIKUL ALAM Diagnosing Phys: Harrell Gave End MD IMPRESSIONS  1. Left ventricular ejection fraction, by visual estimation, is 60 to 65%. The left ventricle has normal function. There is mildly increased left ventricular hypertrophy.  2. The left ventricle has no regional wall motion abnormalities.  3. Global right ventricle has normal systolic function.The right ventricular size is normal. No increase in right ventricular wall thickness.  4. Left atrial size was normal.  5. Right atrial size was normal.  6. Small pericardial effusion.  7. The pericardial  effusion is posterior to the left ventricle.  8. The mitral valve is normal in structure. No evidence of mitral valve regurgitation. No evidence of mitral stenosis.  9. The tricuspid valve is normal in structure. 10. The aortic valve is tricuspid. Aortic valve regurgitation is not visualized. No evidence of aortic valve sclerosis or stenosis. 11. The pulmonic valve was not well visualized. Pulmonic valve regurgitation is not visualized. 12. TR signal is inadequate for assessing pulmonary artery systolic pressure. 13. The interatrial septum was not well visualized. FINDINGS  Left Ventricle: Left ventricular ejection fraction, by visual estimation, is 60 to 65%. The left ventricle has normal function. The left ventricle has no regional wall motion abnormalities. The left ventricular internal cavity size was the left ventricle is normal in size. There is mildly increased left ventricular hypertrophy. Right Ventricle: The right ventricular size is normal. No increase in right ventricular wall thickness. Global RV systolic function is has normal systolic function. Left Atrium: Left atrial size was normal in size. Right Atrium: Right atrial size was normal in size Pericardium: A small pericardial effusion is present. The pericardial effusion is posterior to the left ventricle. Mitral Valve: The mitral valve is normal in structure. No evidence of mitral valve regurgitation. No evidence of mitral valve stenosis by observation. Tricuspid Valve: The tricuspid valve is normal in structure. Tricuspid valve regurgitation is trivial. Aortic Valve: The aortic valve is tricuspid. Aortic valve regurgitation is not visualized. The aortic valve is structurally normal, with no evidence of sclerosis or stenosis. Aortic valve mean gradient measures 4.0 mmHg. Aortic valve peak gradient measures 7.0 mmHg. Aortic valve area, by VTI measures 3.16 cm. Pulmonic Valve: The pulmonic valve was not well visualized. Pulmonic valve regurgitation is  not visualized. Pulmonic regurgitation is not visualized. No evidence of pulmonic stenosis. Aorta: The aortic root and ascending aorta are structurally normal, with no evidence of dilitation. Pulmonary Artery: The pulmonary artery is of normal size and origin. Venous: The inferior vena cava was not well visualized. IAS/Shunts: The interatrial septum was not well visualized.  LEFT VENTRICLE PLAX 2D LVIDd:         4.49 cm  Diastology LVIDs:         2.68 cm  LV e' lateral:   10.70 cm/s LV PW:         1.13 cm  LV E/e' lateral: 8.1 LV IVS:        1.16 cm  LV e' medial:    7.51 cm/s LVOT diam:     2.10 cm  LV E/e' medial:  11.5 LV SV:         65 ml LV SV Index:   31.75 LVOT Area:     3.46 cm  RIGHT VENTRICLE RV S prime:     16.30 cm/s TAPSE (M-mode): 3.5 cm LEFT ATRIUM  Index      RIGHT ATRIUM           Index LA diam:    3.90 cm 1.93 cm/m RA Area:     14.35 cm 7.11 cm/m  AORTIC VALVE                   PULMONIC VALVE AV Area (Vmax):    3.28 cm    PV Vmax:        0.94 m/s AV Area (Vmean):   3.11 cm    PV Peak grad:   3.5 mmHg AV Area (VTI):     3.16 cm    RVOT Peak grad: 4 mmHg AV Vmax:           132.00 cm/s AV Vmean:          95.000 cm/s AV VTI:            0.263 m AV Peak Grad:      7.0 mmHg AV Mean Grad:      4.0 mmHg LVOT Vmax:         125.00 cm/s LVOT Vmean:        85.400 cm/s LVOT VTI:          0.240 m LVOT/AV VTI ratio: 0.91  AORTA Ao Root diam: 3.00 cm MITRAL VALVE MV Area (PHT): 3.20 cm             SHUNTS MV PHT:        68.73 msec           Systemic VTI:  0.24 m MV Decel Time: 237 msec             Systemic Diam: 2.10 cm MV E velocity: 86.30 cm/s 103 cm/s MV A velocity: 77.60 cm/s 70.3 cm/s MV E/A ratio:  1.11       1.5  Harrell Gave End MD Electronically signed by Nelva Bush MD Signature Date/Time: 12/14/2019/6:37:05 PM    Final    US SPLEEN (ABDOMEN LIMITED)  Result Date: 11/25/2019 CLINICAL DATA:  History of polycythemia EXAM: ULTRASOUND ABDOMEN LIMITED COMPARISON:  12/26/2018 FINDINGS:  Spleen is mildly enlarged measuring 16.9 cm in greatest length. This is stable in appearance from the prior exam. Calculated volume is 1142.9 mL this is relatively stable from the prior study. No focal splenic lesion is noted. IMPRESSION: Splenomegaly stable from the prior exam. Electronically Signed   By: Inez Catalina M.D.   On: 11/25/2019 15:17     Subjective: No new complaints feels great.  Discharge Exam: Vitals:   12/17/19 0400 12/17/19 0739  BP: 140/89   Pulse: 79 63  Resp: 20 18  Temp: 97.8 F (36.6 C) 97.7 F (36.5 C)  SpO2: 94%    Vitals:   12/16/19 1615 12/16/19 2048 12/17/19 0400 12/17/19 0739  BP: 124/70 140/86 140/89   Pulse: 80 74 79 63  Resp: 18 (!) 22 20 18   Temp: 98.2 F (36.8 C) 97.7 F (36.5 C) 97.8 F (36.6 C) 97.7 F (36.5 C)  TempSrc: Oral Oral Oral Oral  SpO2: 97% 95% 94%   Weight:      Height:        General: Pt is alert, awake, not in acute distress Cardiovascular: RRR, S1/S2 +, no rubs, no gallops Respiratory: CTA bilaterally, no wheezing, no rhonchi Abdominal: Soft, NT, ND, bowel sounds + Extremities: no edema, no cyanosis    The results of significant diagnostics from this hospitalization (including imaging, microbiology, ancillary and laboratory) are listed  below for reference.     Microbiology: Recent Results (from the past 240 hour(s))  Blood Culture (routine x 2)     Status: None   Collection Time: 12/11/19 11:49 PM   Specimen: BLOOD LEFT HAND  Result Value Ref Range Status   Specimen Description BLOOD LEFT HAND  Final   Special Requests   Final    BOTTLES DRAWN AEROBIC AND ANAEROBIC Blood Culture adequate volume   Culture   Final    NO GROWTH 5 DAYS Performed at [facility], [address], [place], [place] [address]    Report Status 12/17/2019 FINAL  Final  Blood Culture (routine x 2)     Status: None   Collection Time: 12/11/19 11:49 PM   Specimen: BLOOD RIGHT HAND  Result Value Ref Range Status   Specimen  Description BLOOD RIGHT HAND  Final   Special Requests   Final    BOTTLES DRAWN AEROBIC AND ANAEROBIC Blood Culture adequate volume   Culture   Final    NO GROWTH 5 DAYS Performed at [facility], [address] [place], [place] [address]    Report Status 12/17/2019 FINAL  Final     Labs: BNP (last 3 results) Recent Labs    12/15/19 0344 12/16/19 0355 12/17/19 0455  BNP 84.8 54.2 0000000   Basic Metabolic Panel: Recent Labs  Lab 12/12/19 0313 12/12/19 0507 12/13/19 0556 12/14/19 0740 12/15/19 0344 12/16/19 0355 12/17/19 0455  NA  --   --  140 140 139 139 139  K  --   --  3.9 4.2 4.8 4.9 4.8  CL  --   --  107 107 104 107 105  CO2  --   --  22 24 26 24 24   GLUCOSE  --   --  167* 150* 140* 145* 112*  BUN  --   --  21 23 22  24* 24*  CREATININE  --   --  0.65 0.75 0.86 0.82 0.77  CALCIUM  --   --  7.9* 8.1* 8.5* 8.5* 8.3*  MG   < > 2.1 2.4 2.1 2.2 2.2 2.2  PHOS  --  2.7  --   --   --   --   --    < > = values in this interval not displayed.   Liver Function Tests: Recent Labs  Lab 12/13/19 0556 12/14/19 0740 12/15/19 0344 12/16/19 0355 12/17/19 0455  AST 23 28 33 20 19  ALT 20 31 46* 40 38  ALKPHOS 52 51 59 58 58  BILITOT 0.8 0.8 1.1 1.0 1.0  PROT 5.7* 5.8* 5.7* 5.6* 5.1*  ALBUMIN 2.7* 2.8* 3.1* 3.0* 2.9*   No results for input(s): LIPASE, AMYLASE in the last 168 hours. No results for input(s): AMMONIA in the last 168 hours. CBC: Recent Labs  Lab 12/13/19 0556 12/14/19 0740 12/15/19 0344 12/16/19 0355 12/17/19 0455  WBC 3.4* 5.6 5.6 8.6 10.2  NEUTROABS 3.1 5.1 5.1 7.9* 9.2*  HGB 11.1* 12.2* 12.3* 12.4* 12.3*  HCT 36.5* 38.6* 41.7 41.9 41.0  MCV 96.1 90.8 98.3 99.3 99.8  PLT 321 457* 526* 572* 585*   Cardiac Enzymes: No results for input(s): CKTOTAL, CKMB, CKMBINDEX, TROPONINI in the last 168 hours. BNP: Invalid input(s): POCBNP CBG: No results for input(s): GLUCAP in the last 168 hours. D-Dimer Recent Labs    12/16/19 0355  12/17/19 0455  DDIMER 0.64* 0.52*   Hgb A1c No results for input(s): HGBA1C in the last 72 hours.  Lipid Profile No results for input(s): CHOL, HDL, LDLCALC, TRIG, CHOLHDL, LDLDIRECT in the last 72 hours. Thyroid function studies No results for input(s): TSH, T4TOTAL, T3FREE, THYROIDAB in the last 72 hours.  Invalid input(s): FREET3 Anemia work up No results for input(s): VITAMINB12, FOLATE, FERRITIN, TIBC, IRON, RETICCTPCT in the last 72 hours. Urinalysis No results found for: COLORURINE, APPEARANCEUR, [place], [place], GLUCOSEU, [place], [place], [place], [place], UROBILINOGEN, NITRITE, LEUKOCYTESUR Sepsis Labs Invalid input(s): PROCALCITONIN,  WBC,  LACTICIDVEN Microbiology Recent Results (from the past 240 hour(s))  Blood Culture (routine x 2)     Status: None   Collection Time: 12/11/19 11:49 PM   Specimen: BLOOD LEFT HAND  Result Value Ref Range Status   Specimen Description BLOOD LEFT HAND  Final   Special Requests   Final    BOTTLES DRAWN AEROBIC AND ANAEROBIC Blood Culture adequate volume   Culture   Final    NO GROWTH 5 DAYS Performed at [facility], [address] [place], [place] [address]    Report Status 12/17/2019 FINAL  Final  Blood Culture (routine x 2)     Status: None   Collection Time: 12/11/19 11:49 PM   Specimen: BLOOD RIGHT HAND  Result Value Ref Range Status   Specimen Description BLOOD RIGHT HAND  Final   Special Requests   Final    BOTTLES DRAWN AEROBIC AND ANAEROBIC Blood Culture adequate volume   Culture   Final    NO GROWTH 5 DAYS Performed at [facility], [address] [place],  [address]    Report Status 12/17/2019 FINAL  Final     Time coordinating discharge: Over 30 minutes  SIGNED:   Charlynne Cousins, MD  [facility] 12/17/2019, 8:37 AM Pager   If 7PM-7AM, please contact night-coverage [email or web address] Password [email or web address]

## 2019-12-17 NOTE — Plan of Care (Signed)
  Problem: Clinical Measurements: Goal: Respiratory complications will improve Outcome: Adequate for Discharge   Problem: Activity: Goal: Risk for activity intolerance will decrease Outcome: Adequate for Discharge   Problem: Nutrition: Goal: Adequate nutrition will be maintained Outcome: Adequate for Discharge   Problem: Coping: Goal: Level of anxiety will decrease Outcome: Completed/Met

## 2019-12-17 NOTE — Discharge Instructions (Signed)
COVID-19 Frequently Asked Questions COVID-19 (coronavirus disease) is an infection that is caused by a large family of viruses. Some viruses cause illness in people and others cause illness in animals like camels, cats, and bats. In some cases, the viruses that cause illness in animals can spread to humans. Where did the coronavirus come from? In December 2019, [place] told the [facility]) of several cases of lung disease (human respiratory illness). These cases were linked to an open seafood and livestock market in the city of [place]. The link to the seafood and livestock market suggests that the virus may have spread from animals to humans. However, since that first outbreak in December, the virus has also been shown to spread from person to person. What is the name of the disease and the virus? Disease name Early on, this disease was called novel coronavirus. This is because scientists determined that the disease was caused by a new (novel) respiratory virus. The World Health Organization [facility]) has now named the disease COVID-19, or coronavirus disease. Virus name The virus that causes the disease is called severe acute respiratory syndrome coronavirus 2 (SARS-CoV-2). More information on disease and virus naming World Health Organization [facility]): www.who.int/emergencies/diseases/novel-coronavirus-2019/technical-guidance/naming-the-coronavirus-disease-(covid-2019)-and-the-virus-that-causes-it Who is at risk for complications from coronavirus disease? Some people may be at higher risk for complications from coronavirus disease. This includes older adults and people who have chronic diseases, such as heart disease, diabetes, and lung disease. If you are at higher risk for complications, take these extra precautions:  Stay home as much as possible.  Avoid social gatherings and travel.  Avoid close contact with others. Stay at least 6 ft (2 m) away from others, if  possible.  Wash your hands often with soap and water for at least 20 seconds.  Avoid touching your face, mouth, nose, or eyes.  Keep supplies on hand at home, such as food, medicine, and cleaning supplies.  If you must go out in public, wear a cloth face covering or face mask. Make sure your mask covers your nose and mouth. How does coronavirus disease spread? The virus that causes coronavirus disease spreads easily from person to person (is contagious). You may catch the virus by:  Breathing in droplets from an infected person. Droplets can be spread by a person breathing, speaking, singing, coughing, or sneezing.  Touching something, like a table or a doorknob, that was exposed to the virus (contaminated) and then touching your mouth, nose, or eyes. Can I get the virus from touching surfaces or objects? There is still a lot that we do not know about the virus that causes coronavirus disease. Scientists are basing a lot of information on what they know about similar viruses, such as:  Viruses cannot generally survive on surfaces for long. They need a human body (host) to survive.  It is more likely that the virus is spread by close contact with people who are sick (direct contact), such as through: ? Shaking hands or hugging. ? Breathing in respiratory droplets that travel through the air. Droplets can be spread by a person breathing, speaking, singing, coughing, or sneezing.  It is less likely that the virus is spread when a person touches a surface or object that has the virus on it (indirect contact). The virus may be able to enter the body if the person touches a surface or object and then touches his or her face, eyes, nose, or mouth. Can a person spread the virus without having symptoms of the  disease? It may be possible for the virus to spread before a person has symptoms of the disease, but this is most likely not the main way the virus is spreading. It is more likely for the virus  to spread by being in close contact with people who are sick and breathing in the respiratory droplets spread by a person breathing, speaking, singing, coughing, or sneezing. What are the symptoms of coronavirus disease? Symptoms vary from person to person and can range from mild to severe. Symptoms may include:  Fever or chills.  Cough.  Difficulty breathing or feeling short of breath.  Headaches, body aches, or muscle aches.  Runny or stuffy (congested) nose.  Sore throat.  New loss of taste or smell.  Nausea, vomiting, or diarrhea. These symptoms can appear anywhere from 2 to 14 days after you have been exposed to the virus. Some people may not have any symptoms. If you develop symptoms, call your health care provider. People with severe symptoms may need hospital care. Should I be tested for this virus? Your health care provider will decide whether to test you based on your symptoms, history of exposure, and your risk factors. How does a health care provider test for this virus? Health care providers will collect samples to send for testing. Samples may include:  Taking a swab of fluid from the back of your nose and throat, your nose, or your throat.  Taking fluid from the lungs by having you cough up mucus (sputum) into a sterile cup.  Taking a blood sample. Is there a treatment or vaccine for this virus? Currently, there is no vaccine to prevent coronavirus disease. Also, there are no medicines like antibiotics or antivirals to treat the virus. A person who becomes sick is given supportive care, which means rest and fluids. A person may also relieve his or her symptoms by using over-the-counter medicines that treat sneezing, coughing, and runny nose. These are the same medicines that a person takes for the common cold. If you develop symptoms, call your health care provider. People with severe symptoms may need hospital care. What can I do to protect myself and my family from  this virus?     You can protect yourself and your family by taking the same actions that you would take to prevent the spread of other viruses. Take the following actions:  Wash your hands often with soap and water for at least 20 seconds. If soap and water are not available, use alcohol-based hand sanitizer.  Avoid touching your face, mouth, nose, or eyes.  Cough or sneeze into a tissue, sleeve, or elbow. Do not cough or sneeze into your hand or the air. ? If you cough or sneeze into a tissue, throw it away immediately and wash your hands.  Disinfect objects and surfaces that you frequently touch every day.  Stay away from people who are sick.  Avoid going out in public, follow guidance from your state and local health authorities.  Avoid crowded indoor spaces. Stay at least 6 ft (2 m) away from others.  If you must go out in public, wear a cloth face covering or face mask. Make sure your mask covers your nose and mouth.  Stay home if you are sick, except to get medical care. Call your health care provider before you get medical care. Your health care provider will tell you how long to stay home.  Make sure your vaccines are up to date. Ask your health care provider  what vaccines you need. What should I do if I need to travel? Follow travel recommendations from your local health authority, the CDC, and WHO. Travel information and advice  Centers for Disease Control and Prevention (CDC): BodyEditor.hu  World Health Organization [facility]): ThirdIncome.ca Know the risks and take action to protect your health  You are at higher risk of getting coronavirus disease if you are traveling to areas with an outbreak or if you are exposed to travelers from areas with an outbreak.  Wash your hands often and practice good hygiene to lower the risk of catching or spreading the virus. What should I do  if I am sick? General instructions to stop the spread of infection  Wash your hands often with soap and water for at least 20 seconds. If soap and water are not available, use alcohol-based hand sanitizer.  Cough or sneeze into a tissue, sleeve, or elbow. Do not cough or sneeze into your hand or the air.  If you cough or sneeze into a tissue, throw it away immediately and wash your hands.  Stay home unless you must get medical care. Call your health care provider or local health authority before you get medical care.  Avoid public areas. Do not take public transportation, if possible.  If you can, wear a mask if you must go out of the house or if you are in close contact with someone who is not sick. Make sure your mask covers your nose and mouth. Keep your home clean  Disinfect objects and surfaces that are frequently touched every day. This may include: ? Counters and tables. ? Doorknobs and light switches. ? Sinks and faucets. ? Electronics such as phones, remote controls, keyboards, computers, and tablets.  Wash dishes in hot, soapy water or use a dishwasher. Air-dry your dishes.  Wash laundry in hot water. Prevent infecting other household members  Let healthy household members care for children and pets, if possible. If you have to care for children or pets, wash your hands often and wear a mask.  Sleep in a different bedroom or bed, if possible.  Do not share personal items, such as razors, toothbrushes, deodorant, combs, brushes, towels, and washcloths. Where to find more information Centers for Disease Control and Prevention (CDC)  Information and news updates: https://www.butler-gonzalez.com/ World Health Organization [facility])  Information and news updates: MissExecutive.com.ee  Coronavirus health topic: https://www.castaneda.info/  Questions and answers on COVID-19:  OpportunityDebt.at  Global tracker: who.sprinklr.com American Academy of Pediatrics (AAP)  Information for families: www.healthychildren.org/English/health-issues/conditions/chest-lungs/Pages/2019-Novel-Coronavirus.aspx The coronavirus situation is changing rapidly. Check your local health authority website or the CDC and [facility] websites for updates and news. When should I contact a health care provider?  Contact your health care provider if you have symptoms of an infection, such as fever or cough, and you: ? Have been near anyone who is known to have coronavirus disease. ? Have come into contact with a person who is suspected to have coronavirus disease. ? Have traveled to an area where there is an outbreak of COVID-19. When should I get emergency medical care?  Get help right away by calling your local emergency services (911 in the [place] if you have: ? Trouble breathing. ? Pain or pressure in your chest. ? Confusion. ? Blue-tinged lips and fingernails. ? Difficulty waking from sleep. ? Symptoms that get worse. Let the emergency medical personnel know if you think you have coronavirus disease. Summary  A new respiratory virus is spreading from person to person and  causing COVID-19 (coronavirus disease).  The virus that causes COVID-19 appears to spread easily. It spreads from one person to another through droplets from breathing, speaking, singing, coughing, or sneezing.  Older adults and those with chronic diseases are at higher risk of disease. If you are at higher risk for complications, take extra precautions.  There is currently no vaccine to prevent coronavirus disease. There are no medicines, such as antibiotics or antivirals, to treat the virus.  You can protect yourself and your family by washing your hands often, avoiding touching your face, and covering your coughs and sneezes. This information is not intended to replace advice given to you  by your health care provider. Make sure you discuss any questions you have with your health care provider. Document Revised: 10/02/2019 Document Reviewed: 03/31/2019 Elsevier Patient Education  [place]: Quarantine vs. Isolation QUARANTINE keeps someone who was in close contact with someone who has COVID-19 away from others. If you had close contact with a person who has COVID-19  Stay home until 14 days after your last contact.  Check your temperature twice a day and watch for symptoms of COVID-19.  If possible, stay away from people who are at higher-risk for getting very sick from COVID-19. ISOLATION keeps someone who is sick or tested positive for COVID-19 without symptoms away from others, even in their own home. If you are sick and think or know you have COVID-19  Stay home until after ? At least 10 days since symptoms first appeared and ? At least 24 hours with no fever without fever-reducing medication and ? Symptoms have improved If you tested positive for COVID-19 but do not have symptoms  Stay home until after ? 10 days have passed since your positive test If you live with others, stay in a specific "sick room" or area and away from other people or animals, including pets. Use a separate bathroom, if available. [email or web address] 07/06/2019 This information is not intended to replace advice given to you by your health care provider. Make sure you discuss any questions you have with your health care provider. Document Revised: 11/19/2019 Document Reviewed: 11/19/2019 Elsevier Patient Education  2020 [place]   Derek [name] was admitted to the Hospital on 12/14/2019 and Discharged on Discharge Date 12/17/2019 and should be excused from work/school   for 10  days starting 12/14/2019 , may return to work/school without any restrictions.  Call Bess Harvest MD, [place] [facility] [phone] with questions.  Charlynne Cousins M.D on  12/17/2019,at 10:27 AM  [facility] Office  [phone]

## 2020-01-06 ENCOUNTER — Inpatient Hospital Stay: Payer: No Typology Code available for payment source | Attending: Internal Medicine

## 2020-01-06 ENCOUNTER — Other Ambulatory Visit: Payer: Self-pay

## 2020-01-06 ENCOUNTER — Inpatient Hospital Stay: Payer: No Typology Code available for payment source

## 2020-01-06 DIAGNOSIS — D45 Polycythemia vera: Secondary | ICD-10-CM

## 2020-01-06 LAB — HEMATOCRIT: HCT: 41.4 % (ref 39.0–52.0)

## 2020-01-06 LAB — HEMOGLOBIN: Hemoglobin: 12.4 g/dL — ABNORMAL LOW (ref 13.0–17.0)

## 2020-01-19 ENCOUNTER — Other Ambulatory Visit: Payer: Self-pay

## 2020-02-18 ENCOUNTER — Other Ambulatory Visit: Payer: Self-pay | Admitting: Specialist

## 2020-02-18 DIAGNOSIS — R05 Cough: Secondary | ICD-10-CM

## 2020-02-18 DIAGNOSIS — R1312 Dysphagia, oropharyngeal phase: Secondary | ICD-10-CM

## 2020-02-18 DIAGNOSIS — R053 Chronic cough: Secondary | ICD-10-CM

## 2020-03-01 ENCOUNTER — Other Ambulatory Visit: Payer: Self-pay | Admitting: *Deleted

## 2020-03-01 DIAGNOSIS — D45 Polycythemia vera: Secondary | ICD-10-CM

## 2020-03-02 ENCOUNTER — Inpatient Hospital Stay: Payer: 59 | Attending: Internal Medicine | Admitting: Internal Medicine

## 2020-03-02 ENCOUNTER — Other Ambulatory Visit: Payer: Self-pay

## 2020-03-02 ENCOUNTER — Inpatient Hospital Stay: Payer: 59

## 2020-03-02 VITALS — BP 130/85 | HR 100 | Resp 18

## 2020-03-02 DIAGNOSIS — D45 Polycythemia vera: Secondary | ICD-10-CM

## 2020-03-02 DIAGNOSIS — D751 Secondary polycythemia: Secondary | ICD-10-CM

## 2020-03-02 LAB — CBC WITH DIFFERENTIAL/PLATELET
Abs Immature Granulocytes: 0.1 10*3/uL — ABNORMAL HIGH (ref 0.00–0.07)
Basophils Absolute: 0.1 10*3/uL (ref 0.0–0.1)
Basophils Relative: 1 %
Eosinophils Absolute: 0.1 10*3/uL (ref 0.0–0.5)
Eosinophils Relative: 2 %
HCT: 43.3 % (ref 39.0–52.0)
Hemoglobin: 13 g/dL (ref 13.0–17.0)
Immature Granulocytes: 1 %
Lymphocytes Relative: 12 %
Lymphs Abs: 1 10*3/uL (ref 0.7–4.0)
MCH: 28.7 pg (ref 26.0–34.0)
MCHC: 30 g/dL (ref 30.0–36.0)
MCV: 95.6 fL (ref 80.0–100.0)
Monocytes Absolute: 0.3 10*3/uL (ref 0.1–1.0)
Monocytes Relative: 4 %
Neutro Abs: 6.2 10*3/uL (ref 1.7–7.7)
Neutrophils Relative %: 80 %
Platelets: 474 10*3/uL — ABNORMAL HIGH (ref 150–400)
RBC: 4.53 MIL/uL (ref 4.22–5.81)
RDW: 15.1 % (ref 11.5–15.5)
WBC: 7.8 10*3/uL (ref 4.0–10.5)
nRBC: 0 % (ref 0.0–0.2)

## 2020-03-02 LAB — LACTATE DEHYDROGENASE: LDH: 163 U/L (ref 98–192)

## 2020-03-02 LAB — COMPREHENSIVE METABOLIC PANEL
ALT: 14 U/L (ref 0–44)
AST: 19 U/L (ref 15–41)
Albumin: 3.8 g/dL (ref 3.5–5.0)
Alkaline Phosphatase: 94 U/L (ref 38–126)
Anion gap: 10 (ref 5–15)
BUN: 12 mg/dL (ref 8–23)
CO2: 25 mmol/L (ref 22–32)
Calcium: 8.7 mg/dL — ABNORMAL LOW (ref 8.9–10.3)
Chloride: 104 mmol/L (ref 98–111)
Creatinine, Ser: 0.86 mg/dL (ref 0.61–1.24)
GFR calc Af Amer: >60 mL/min (ref >60)
GFR calc non Af Amer: >60 mL/min (ref >60)
Glucose, Bld: 142 mg/dL — ABNORMAL HIGH (ref 70–99)
Potassium: 4 mmol/L (ref 3.5–5.1)
Sodium: 139 mmol/L (ref 135–145)
Total Bilirubin: 0.8 mg/dL (ref 0.3–1.2)
Total Protein: 6.6 g/dL (ref 6.5–8.1)

## 2020-03-02 MED ORDER — HYDROXYUREA 500 MG PO CAPS: ORAL_CAPSULE | ORAL | 1 refills | Status: DC

## 2020-03-02 NOTE — Progress Notes (Signed)
[place] OFFICE PROGRESS NOTE  Patient Care Team: Dion Body, MD as PCP - General (Family Medicine)  Cancer Staging No matching staging information was found for the patient.   Oncology History Overview Note  # 2014- POLYCYTHEMIA VERA - JAK-2 Positive; on Hydrea 500mg /day; aspirin 81 mg a day; phlebotomy for Hct >43 [headaches];   # MAY 2019- BMBx- pan-hypercellular; no evidence of significant fibrosis; MAY [place]- 1389 cc/ splenomegaly; Oct 11th 2019- Increase in splenomegaly; Increased Hydrea 1500mg /day.   # nonarteritic ischemic optic neuropathy-Left eye [may 2018; Dr.Dingledein/Dr.Sitko at [place]; Previous Right eye ---------------------------------------    DIAGNOSIS: [ ]  POLYCYTHEMIA VERA  ;GOALS: control  CURRENT/MOST RECENT THERAPY [ ]  Hydrea        Polycythemia vera (HCC)      INTERVAL HISTORY:  Derek [name] 64 y.o.  male pleasant patient above history of polycythemia vera on hydrea is here for follow-up.   Patient complains of fevers 99-100 for the last 1 to 2 weeks.  Patient has been evaluated by PCP.  Patient had Covid infection approximately 4 to 6 weeks ago.  Otherwise denies any worsening cough.  Complains of fatigue.  Mild shortness of breath.   No nausea no vomiting no headaches.  Appetite is fair.  No weight loss.  No diarrhea.  Review of Systems  Constitutional: Positive for fever and malaise/fatigue. Negative for chills, diaphoresis and weight loss.  HENT: Negative for nosebleeds and sore throat.   Eyes: Negative for double vision.  Respiratory: Negative for cough, hemoptysis, sputum production, shortness of breath and wheezing.   Cardiovascular: Negative for chest pain, palpitations, orthopnea and leg swelling.  Gastrointestinal: Negative for abdominal pain, blood in stool, constipation, diarrhea, heartburn, melena, nausea and vomiting.  Genitourinary: Negative for dysuria, frequency and urgency.  Musculoskeletal: Negative for  back pain and joint pain.  Skin: Negative.  Negative for itching and rash.  Neurological: Negative for dizziness, tingling, focal weakness, weakness and headaches.  Endo/Heme/Allergies: Does not bruise/bleed easily.  Psychiatric/Behavioral: Negative for depression. The patient is not nervous/anxious and does not have insomnia.       PAST MEDICAL HISTORY :  Past Medical History:  Diagnosis Date  . Arthritis   . GERD (gastroesophageal reflux disease)   . Gout   . Low serum testosterone level   . Polycythemia vera ([place])   . Polycythemia, secondary 06/08/2015  . Pulmonary nodules   . Thoracic spine fracture (HCC)    compression fracture following a fall    PAST SURGICAL HISTORY :   Past Surgical History:  Procedure Laterality Date  . COLONOSCOPY  2013    FAMILY HISTORY :   Family History  Problem Relation Age of Onset  . Throat cancer Other        uncle  . Migraines Neg Hx   . Multiple sclerosis Neg Hx   . Neurofibromatosis Neg Hx   . Parkinsonism Neg Hx   . Seizures Neg Hx   . Neuropathy Neg Hx     SOCIAL HISTORY:   Social History   Tobacco Use  . Smoking status: Never Smoker  . Smokeless tobacco: Never Used  Substance Use Topics  . Alcohol use: Yes    Alcohol/week: 0.0 standard drinks    Comment: occasional alcohol use  . Drug use: No    ALLERGIES:  is allergic to penicillin g.  MEDICATIONS:  Current Outpatient Medications  Medication Sig Dispense Refill  . acetaminophen (TYLENOL) 500 MG tablet Take 500 mg by mouth every 6 (  six) hours as needed for fever.    Marland Kitchen amLODipine (NORVASC) 5 MG tablet Take 5 mg daily by mouth.  11  . aspirin EC 81 MG tablet Take 81 mg daily by mouth.     . Cholecalciferol (VITAMIN D-1000 MAX ST) 1000 units tablet Take 1,000 Units daily by mouth.     . Esomeprazole Magnesium (NEXIUM PO) Take 2 capsules by mouth daily.     . hydroxyurea (HYDREA) 500 MG capsule May take with food to minimize GI side effects. 810 capsule 1  . Multiple  Vitamins-Minerals (MULTIVITAMIN ADULT PO) Take 1 tablet by mouth 1 day or 1 dose.    . ondansetron (ZOFRAN ODT) 4 MG disintegrating tablet Take 1 tablet (4 mg total) by mouth every 8 (eight) hours as needed for nausea or vomiting. (Patient not taking: Reported on 03/02/2020) 20 tablet 0   No current facility-administered medications for this visit.    PHYSICAL EXAMINATION: ECOG PERFORMANCE STATUS: 0 - Asymptomatic  BP (!) 151/85 (Patient Position: Sitting)   Pulse (!) 101   Temp 97.9 F (36.6 C) (Tympanic)   Resp 20   Ht 5\' 9"  (1.753 m)   Wt 187 lb 3.2 oz (84.9 kg)   BMI 27.64 kg/m   Filed Weights   03/02/20 1335  Weight: 187 lb 3.2 oz (84.9 kg)    Physical Exam  Constitutional: He is oriented to person, place, and time and well-developed, well-nourished, and in no distress.  HENT:  Head: Normocephalic and atraumatic.  Mouth/Throat: Oropharynx is clear and moist. No oropharyngeal exudate.  Eyes: Pupils are equal, round, and reactive to light.  Cardiovascular: Normal rate and regular rhythm.  Pulmonary/Chest: No respiratory distress. He has no wheezes.  Abdominal: Soft. Bowel sounds are normal. He exhibits no distension and no mass. There is no abdominal tenderness. There is no rebound and no guarding.  Positive for mild splenomegaly.  Musculoskeletal:        General: No tenderness or edema. Normal range of motion.     Cervical back: Normal range of motion and neck supple.  Neurological: He is alert and oriented to person, place, and time.  Skin: Skin is warm.  Psychiatric: Affect normal.       LABORATORY DATA:  I have reviewed the data as listed    Component Value Date/Time   NA 139 03/02/2020 1257   NA 140 03/02/2014 0835   K 4.0 03/02/2020 1257   K 3.8 03/02/2014 0835   CL 104 03/02/2020 1257   CL 109 (H) 03/02/2014 0835   CO2 25 03/02/2020 1257   CO2 26 03/02/2014 0835   GLUCOSE 142 (H) 03/02/2020 1257   GLUCOSE 108 (H) 03/02/2014 0835   BUN 12 03/02/2020  1257   BUN 9 03/02/2014 0835   CREATININE 0.86 03/02/2020 1257   CREATININE 0.99 03/02/2014 0835   CALCIUM 8.7 (L) 03/02/2020 1257   CALCIUM 8.6 03/02/2014 0835   PROT 6.6 03/02/2020 1257   PROT 7.0 03/02/2014 0835   ALBUMIN 3.8 03/02/2020 1257   ALBUMIN 3.7 03/02/2014 0835   AST 19 03/02/2020 1257   AST 20 03/02/2014 0835   ALT 14 03/02/2020 1257   ALT 24 03/02/2014 0835   ALKPHOS 94 03/02/2020 1257   ALKPHOS 96 03/02/2014 0835   BILITOT 0.8 03/02/2020 1257   BILITOT 0.6 03/02/2014 0835   GFRNONAA >60 03/02/2020 1257   GFRNONAA >60 03/02/2014 0835   GFRAA >60 03/02/2020 1257   GFRAA >60 03/02/2014 0835    No  results found for: SPEP, UPEP  Lab Results  Component Value Date   WBC 7.8 03/02/2020   NEUTROABS 6.2 03/02/2020   HGB 13.0 03/02/2020   HCT 43.3 03/02/2020   MCV 95.6 03/02/2020   PLT 474 (H) 03/02/2020      Chemistry      Component Value Date/Time   NA 139 03/02/2020 1257   NA 140 03/02/2014 0835   K 4.0 03/02/2020 1257   K 3.8 03/02/2014 0835   CL 104 03/02/2020 1257   CL 109 (H) 03/02/2014 0835   CO2 25 03/02/2020 1257   CO2 26 03/02/2014 0835   BUN 12 03/02/2020 1257   BUN 9 03/02/2014 0835   CREATININE 0.86 03/02/2020 1257   CREATININE 0.99 03/02/2014 0835      Component Value Date/Time   CALCIUM 8.7 (L) 03/02/2020 1257   CALCIUM 8.6 03/02/2014 0835   ALKPHOS 94 03/02/2020 1257   ALKPHOS 96 03/02/2014 0835   AST 19 03/02/2020 1257   AST 20 03/02/2014 0835   ALT 14 03/02/2020 1257   ALT 24 03/02/2014 0835   BILITOT 0.8 03/02/2020 1257   BILITOT 0.6 03/02/2014 0835       RADIOGRAPHIC STUDIES: I have personally reviewed the radiological images as listed and agreed with the findings in the report. No results found.   ASSESSMENT & PLAN:  Polycythemia vera ([place]) # Polycythemia vera Jak 2+; on Hydrea 500 mg twice a day; except Saturday Sunday-extra pill.  December 2020-spleen volume 1140/stable.  Hemoglobin is  12.3 hematocrit 43 platelet-  474 Proceed with phleboltomy today.   # No concerns for transformation/progression.  Continue current dose of Hydrea.  Will get ultrasound in 4 months.  # Low grade fevers- [s/p COVID]-discussed that this could be manifestation of inflammation post Covid infection.  Recommend continued hydration/NSAIDs.  Do not suspect again transformation.  #Disposition # proceed with phlebotomy today # In 2 month H&H//possible phlebotomy #Follow-up-4 months/MD-CBC/CMP LDH; possible phlebotomy; US spleen prior- Dr.B   Orders Placed This Encounter  Procedures  . US SPLEEN (ABDOMEN LIMITED)    Standing Status:   Future    Standing Expiration Date:   05/02/2021    Order Specific Question:   Reason for Exam (SYMPTOM  OR DIAGNOSIS REQUIRED)    Answer:   polycythemiea cera; splenomegaly    Order Specific Question:   Preferred imaging location?    Answer:   [facility]  . Hematocrit [facility])    Standing Status:   Future    Standing Expiration Date:   03/02/2021  . Hemoglobin [facility])    Standing Status:   Future    Standing Expiration Date:   03/02/2021  . CBC with Differential    Standing Status:   Future    Standing Expiration Date:   03/02/2021  . Comprehensive metabolic panel    Standing Status:   Future    Standing Expiration Date:   03/02/2021  . Lactate dehydrogenase    Standing Status:   Future    Standing Expiration Date:   03/02/2021   All questions were answered. The patient knows to call the clinic with any problems, questions or concerns.      Cammie Sickle, MD 03/09/2020 7:15 AM

## 2020-03-02 NOTE — Assessment & Plan Note (Addendum)
#   Polycythemia vera Jak 2+; on Hydrea 500 mg twice a day; except Saturday Sunday-extra pill.  December 2020-spleen volume 1140/stable.  Hemoglobin is  12.3 hematocrit 43 platelet- 474 Proceed with phleboltomy today.   # No concerns for transformation/progression.  Continue current dose of Hydrea.  Will get ultrasound in 4 months.  # Low grade fevers- [s/p COVID]-discussed that this could be manifestation of inflammation post Covid infection.  Recommend continued hydration/NSAIDs.  Do not suspect again transformation.  #Disposition # proceed with phlebotomy today # In 2 month H&H//possible phlebotomy #Follow-up-4 months/MD-CBC/CMP LDH; possible phlebotomy; US spleen prior- Dr.B

## 2020-04-01 ENCOUNTER — Ambulatory Visit
Admission: RE | Admit: 2020-04-01 | Discharge: 2020-04-01 | Disposition: A | Discharge status: Home / Self Care | Payer: 59 | Source: Ambulatory Visit | Attending: Specialist | Admitting: Specialist

## 2020-04-01 ENCOUNTER — Other Ambulatory Visit: Payer: Self-pay

## 2020-04-01 DIAGNOSIS — R05 Cough: Secondary | ICD-10-CM | POA: Diagnosis present

## 2020-04-01 DIAGNOSIS — R1312 Dysphagia, oropharyngeal phase: Secondary | ICD-10-CM | POA: Diagnosis present

## 2020-04-01 DIAGNOSIS — R053 Chronic cough: Secondary | ICD-10-CM

## 2020-04-01 NOTE — Therapy (Signed)
[place], [place], [address] Phone: [phone]   Fax:     Modified Barium Swallow  Patient Details  Name: Derek [name] MRN: [id number] Date of Birth: 1956/04/06 No data recorded  Encounter Date: 04/01/2020  End of Session - 04/01/20 1315    Visit Number  1    Number of Visits  1    Date for SLP Re-Evaluation  04/01/20    SLP Start Time  2    SLP Stop Time   1315    SLP Time Calculation (min)  35 min    Activity Tolerance  Patient tolerated treatment well       Past Medical History:  Diagnosis Date  . Arthritis   . GERD (gastroesophageal reflux disease)   . Gout   . Low serum testosterone level   . Polycythemia vera ([place])   . Polycythemia, secondary 06/08/2015  . Pulmonary nodules   . Thoracic spine fracture (HCC)    compression fracture following a fall    Past Surgical History:  Procedure Laterality Date  . COLONOSCOPY  2013    There were no vitals filed for this visit.   Subjective: Patient behavior: (alertness, ability to follow instructions, etc.): The patient is alert, able to verbalize his swallowing complaints, and follow directions.  Chief complaint: persistent cough   Objective:  Radiological Procedure: A videoflouroscopic evaluation of oral-preparatory, reflex initiation, and pharyngeal phases of the swallow was performed; as well as a screening of the upper esophageal phase.  I. POSTURE: Upright in MBS chair, standing  II. VIEW: Lateral, A-P  III. COMPENSATORY STRATEGIES: N/A  IV. BOLUSES ADMINISTERED:   Thin Liquid: 1 small, 3 rapid consecutive   Nectar-thick Liquid: 2 moderate   Honey-thick Liquid: DNT   Puree: 2 teaspoon, 1 heaping teaspoon   Mechanical Soft: 1/4 graham cracker in applesauce  V. RESULTS OF EVALUATION: A. ORAL PREPARATORY PHASE: (The lips, tongue, and velum are observed for strength and coordination)       **Overall Severity  Rating: within normal limits   B. SWALLOW INITIATION/REFLEX: (The reflex is normal if "triggered" by the time the bolus reached the base of the tongue)  **Overall Severity Rating: Mild; triggers while falling from the valleculae to the pyriform sinuses   C. PHARYNGEAL PHASE: (Pharyngeal function is normal if the bolus shows rapid, smooth, and continuous transit through the pharynx and there is no pharyngeal residue after the swallow)  **Overall Severity Rating: within normal limits   D. LARYNGEAL PENETRATION: (Material entering into the laryngeal inlet/vestibule but not aspirated) None  E. ASPIRATION: None  F. ESOPHAGEAL PHASE: (Screening of the upper esophagus) An esophageal sweep in the upright position with liquid and solid consistencies showed mid- and distal-esophageal retention with retrograde flow below the level of the pharyngoesophageal segment.     ASSESSMENT: This 64 year old man; with persistent cough; is presenting with minimal oropharyngeal dysphagia characterized by delayed pharyngeal swallow initiation.  Oral control of the bolus including oral hold, rotary mastication, and anterior to posterior transfer is within functional limits.   Aspects of the pharyngeal stage of swallowing including tongue base retraction, hyolaryngeal excursion, epiglottic inversion, and duration/amplitude of UES opening are within normal limits.  There is no observed pharyngeal residue, laryngeal penetration, or tracheal aspiration.  The patient is not at risk for prandial aspiration.  Delayed pharyngeal swallow initiation is consistent with effects of laryngopharyngeal reflux (inflammation, edema, and resultant decreased sensation of the larynx and pharynx).  An esophageal sweep in the upright position with liquid and solid consistencies showed mid- and distal-esophageal retention with retrograde flow below the level of the pharyngoesophageal segment.   The patient may benefit from further evaluation with GI  to assess esophageal function.  PLAN/RECOMMENDATIONS:  A. Diet: Regular   B. Swallowing Precautions: Reflux precautions   C. Recommended consultation to: GI   D. Therapy recommendations: speech therapy is not indicated   E. Results and recommendations were discussed with the patient immediately following the study and the final report routed to the referring MD.   Oropharyngeal dysphagia - Plan: DG SWALLOW FUNC SPEECH PATH, DG SWALLOW FUNC SPEECH PATH  Persistent cough - Plan: DG SWALLOW FUNC SPEECH PATH, DG SWALLOW FUNC SPEECH PATH        Problem List Patient Active Problem List   Diagnosis Date Noted  . COVID-19 virus infection 12/14/2019  . Essential hypertension 12/12/2019  . Hypokalemia 12/12/2019  . Acute respiratory failure with hypoxia ([place]) 12/12/2019  . Pneumonia due to COVID-19 virus 12/12/2019  . Acute hypoxemic respiratory failure due to COVID-19 ([place]) 12/12/2019  . Arthritis 04/18/2016  . Acid reflux 04/18/2016  . Gout 04/18/2016  . Polycythemia vera ([place]) 04/18/2016  . Polycythemia, secondary 06/08/2015  . Conjunctival hyperemia 09/28/2013  . Cephalalgia 09/28/2013   Leroy Sea, MS/CCC- SLP  Lou Miner 04/01/2020, 1:15 PM  [facility] [place], [place], [address] Phone: [phone]   Fax:     Name: Derek [name] MRN: [id number] Date of Birth: Feb 08, 1956

## 2020-05-04 ENCOUNTER — Other Ambulatory Visit: Payer: Self-pay

## 2020-05-04 ENCOUNTER — Inpatient Hospital Stay: Payer: No Typology Code available for payment source

## 2020-05-04 ENCOUNTER — Inpatient Hospital Stay: Payer: No Typology Code available for payment source | Attending: Internal Medicine

## 2020-05-04 DIAGNOSIS — D45 Polycythemia vera: Secondary | ICD-10-CM | POA: Insufficient documentation

## 2020-05-04 LAB — HEMOGLOBIN: Hemoglobin: 12.1 g/dL — ABNORMAL LOW (ref 13.0–17.0)

## 2020-05-04 LAB — HEMATOCRIT: HCT: 40.8 % (ref 39.0–52.0)

## 2020-05-05 ENCOUNTER — Telehealth: Payer: Self-pay | Admitting: Internal Medicine

## 2020-05-05 ENCOUNTER — Other Ambulatory Visit
Admission: RE | Admit: 2020-05-05 | Discharge: 2020-05-05 | Disposition: A | Discharge status: Home / Self Care | Payer: 59 | Source: Ambulatory Visit | Attending: Infectious Diseases | Admitting: Infectious Diseases

## 2020-05-05 ENCOUNTER — Other Ambulatory Visit: Payer: Self-pay

## 2020-05-05 ENCOUNTER — Encounter: Payer: Self-pay | Admitting: Infectious Diseases

## 2020-05-05 ENCOUNTER — Inpatient Hospital Stay: Admission: RE | Admit: 2020-05-05 | Payer: 59 | Source: Ambulatory Visit

## 2020-05-05 ENCOUNTER — Ambulatory Visit: Payer: 59 | Attending: Infectious Diseases | Admitting: Infectious Diseases

## 2020-05-05 VITALS — BP 127/85 | HR 107 | Temp 98.2°F | Resp 16 | Ht 69.0 in | Wt 187.0 lb

## 2020-05-05 DIAGNOSIS — M1990 Unspecified osteoarthritis, unspecified site: Secondary | ICD-10-CM | POA: Diagnosis not present

## 2020-05-05 DIAGNOSIS — Z791 Long term (current) use of non-steroidal anti-inflammatories (NSAID): Secondary | ICD-10-CM | POA: Insufficient documentation

## 2020-05-05 DIAGNOSIS — R509 Fever, unspecified: Secondary | ICD-10-CM | POA: Diagnosis not present

## 2020-05-05 DIAGNOSIS — Z79899 Other long term (current) drug therapy: Secondary | ICD-10-CM | POA: Insufficient documentation

## 2020-05-05 DIAGNOSIS — Z8781 Personal history of (healed) traumatic fracture: Secondary | ICD-10-CM | POA: Diagnosis not present

## 2020-05-05 DIAGNOSIS — R918 Other nonspecific abnormal finding of lung field: Secondary | ICD-10-CM | POA: Insufficient documentation

## 2020-05-05 DIAGNOSIS — Z0184 Encounter for antibody response examination: Secondary | ICD-10-CM | POA: Insufficient documentation

## 2020-05-05 DIAGNOSIS — Z8619 Personal history of other infectious and parasitic diseases: Secondary | ICD-10-CM | POA: Insufficient documentation

## 2020-05-05 DIAGNOSIS — Z8701 Personal history of pneumonia (recurrent): Secondary | ICD-10-CM | POA: Diagnosis not present

## 2020-05-05 DIAGNOSIS — Z8616 Personal history of COVID-19: Secondary | ICD-10-CM | POA: Insufficient documentation

## 2020-05-05 DIAGNOSIS — M109 Gout, unspecified: Secondary | ICD-10-CM | POA: Insufficient documentation

## 2020-05-05 DIAGNOSIS — Z880 Allergy status to penicillin: Secondary | ICD-10-CM | POA: Diagnosis not present

## 2020-05-05 DIAGNOSIS — D45 Polycythemia vera: Secondary | ICD-10-CM | POA: Diagnosis not present

## 2020-05-05 LAB — CBC WITH DIFFERENTIAL/PLATELET
Abs Immature Granulocytes: 0.14 10*3/uL — ABNORMAL HIGH (ref 0.00–0.07)
Basophils Absolute: 0.1 10*3/uL (ref 0.0–0.1)
Basophils Relative: 1 %
Eosinophils Absolute: 0.1 10*3/uL (ref 0.0–0.5)
Eosinophils Relative: 1 %
HCT: 43.4 % (ref 39.0–52.0)
Hemoglobin: 12.7 g/dL — ABNORMAL LOW (ref 13.0–17.0)
Immature Granulocytes: 1 %
Lymphocytes Relative: 8 %
Lymphs Abs: 0.9 10*3/uL (ref 0.7–4.0)
MCH: 27.2 pg (ref 26.0–34.0)
MCHC: 29.3 g/dL — ABNORMAL LOW (ref 30.0–36.0)
MCV: 92.9 fL (ref 80.0–100.0)
Monocytes Absolute: 0.5 10*3/uL (ref 0.1–1.0)
Monocytes Relative: 5 %
Neutro Abs: 8.8 10*3/uL — ABNORMAL HIGH (ref 1.7–7.7)
Neutrophils Relative %: 84 %
Platelets: 430 10*3/uL — ABNORMAL HIGH (ref 150–400)
RBC: 4.67 MIL/uL (ref 4.22–5.81)
RDW: 15.3 % (ref 11.5–15.5)
WBC: 10.5 10*3/uL (ref 4.0–10.5)
nRBC: 0 % (ref 0.0–0.2)

## 2020-05-05 LAB — C-REACTIVE PROTEIN: CRP: 5.6 mg/dL — ABNORMAL HIGH (ref <1.0)

## 2020-05-05 LAB — FERRITIN: Ferritin: 22 ng/mL — ABNORMAL LOW (ref 24–336)

## 2020-05-05 LAB — LACTATE DEHYDROGENASE: LDH: 199 U/L — ABNORMAL HIGH (ref 98–192)

## 2020-05-05 NOTE — Patient Instructions (Addendum)
You are here for low grade fever for the past 2  Months- fever at night time- You had covid in Dec and was treated at [facility] You have polycythemia vera and on hydroxyurea Today we will do some labs Keep a monitor of your temp ( three times a day)  and avoid tylenol or Ibuprofen Will discuss on Tuesday.
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
## 2020-05-05 NOTE — Telephone Encounter (Signed)
On 5/20-spoke to Dr. Karma Greaser regarding patient's ongoing low-grade fevers.  Covid versus polycythemia vera transformation.  I think bone marrow biopsy is reasonable.  I left a message for the patient to call us back to discuss regarding bone marrow biopsy.

## 2020-05-06 LAB — MISC LABCORP TEST (SEND OUT): Labcorp test code: 164090

## 2020-05-07 LAB — FUNGITELL, SERUM: Fungitell Result: 50 pg/mL (ref <80)

## 2020-05-08 ENCOUNTER — Telehealth: Payer: Self-pay | Admitting: Internal Medicine

## 2020-05-08 NOTE — Telephone Encounter (Signed)
I spoke to patient regarding my discussion with Dr. Steva Ready regarding work-up for fevers of unknown origin.  Discussed regarding bone marrow biopsy/aspirate work-up given his polycythemia vera.   Patient is in agreement for the bone marrow biopsy.  However, he is currently awaiting results of the work-up with ID on 5/27.

## 2020-05-10 NOTE — Telephone Encounter (Signed)
Bone marrow biopsy worksheet faxed to specialty scheduling.

## 2020-05-11 ENCOUNTER — Other Ambulatory Visit: Payer: Self-pay | Admitting: Internal Medicine

## 2020-05-11 ENCOUNTER — Other Ambulatory Visit: Payer: Self-pay | Admitting: Infectious Diseases

## 2020-05-11 DIAGNOSIS — R509 Fever, unspecified: Secondary | ICD-10-CM

## 2020-05-11 DIAGNOSIS — D45 Polycythemia vera: Secondary | ICD-10-CM

## 2020-05-11 NOTE — Telephone Encounter (Signed)
Spoke with Dr. Brahmanday as there is no current order in system for scheduling to arrange biopsy. MD clarified that he is waiting on infectious disease provider to collaborate regarding bone marrow workup. OK per MD to hold off on scheduling the bone marrow biopsy at this time. 

## 2020-05-11 NOTE — Progress Notes (Signed)
Spoke to patient who is at work now. He feels fine until evening when he has fever . Without tylenol the fever went upto 102 last week and he then started taking 1 tylenol every night to sleep. He has some night sweats No cough, sob, chest pain, rash, headache, Gi symptoms, joint pain. He has Pvera and on hydroxy urea and has splenomegaly He had covid in Dec 2020 when he was treated at [facility] with remdisivir, IL6 inhibitor and steroids He started having fever in Feb ( ? Late jan)  Ferritin 25 CRP 5.6 Fungitell N Quantiferon gold Neg  He has a positive rheumatoid factor of 40 on 01/29/20  Diagnosis -Nocturnal Fever Is this long covid? Is this transformation of Pvera?  Will get Blood culture Will get HIV/ESR/Rh factor Will need bone marrow biopsy depending on the above test results

## 2020-05-11 NOTE — Progress Notes (Signed)
Discussed with Dr. Steva Ready; recommends bone marrow biopsy for further evaluation of fever of unknown origin.  H/J-please inform patient that bone marrow biopsy is ordered; please schedule  Schedule follow-up-with me in 1 week post biopsy-MD;  NO labs

## 2020-05-11 NOTE — Progress Notes (Signed)
Patient contacted. Bone marrow biopsy scheduled for June 7'th at 7:30 am. Patient contacted and provided with instructions to be NPO after midnight in prep for this procedure. He may take his routine bp/heart meds with sips of water only on day of procedure. Pt gave verbal understanding of the plan of care. Teach back process performed with patient.

## 2020-05-11 NOTE — Progress Notes (Signed)
Specialty schedule worksheet faxed to [facility] in scheduling. Tamika/Jennifer Please follow-up on biopsy date.  Pt notified of plan of care. He requests that he be notified when biopsy date if planned. He works at [employer] and needs plenty of advance noticed for time out for the procedure.Marland Kitchen

## 2020-05-19 ENCOUNTER — Other Ambulatory Visit: Payer: Self-pay | Admitting: Radiology

## 2020-05-19 ENCOUNTER — Ambulatory Visit: Payer: 59 | Admitting: Infectious Diseases

## 2020-05-20 ENCOUNTER — Other Ambulatory Visit: Payer: Self-pay | Admitting: Radiology

## 2020-05-23 ENCOUNTER — Ambulatory Visit
Admission: RE | Admit: 2020-05-23 | Discharge: 2020-05-23 | Disposition: A | Discharge status: Home / Self Care | Payer: No Typology Code available for payment source | Source: Ambulatory Visit | Attending: Internal Medicine | Admitting: Internal Medicine

## 2020-05-23 ENCOUNTER — Other Ambulatory Visit: Payer: Self-pay

## 2020-05-23 DIAGNOSIS — K219 Gastro-esophageal reflux disease without esophagitis: Secondary | ICD-10-CM | POA: Insufficient documentation

## 2020-05-23 DIAGNOSIS — R509 Fever, unspecified: Secondary | ICD-10-CM | POA: Insufficient documentation

## 2020-05-23 DIAGNOSIS — Z7982 Long term (current) use of aspirin: Secondary | ICD-10-CM | POA: Insufficient documentation

## 2020-05-23 DIAGNOSIS — Z8616 Personal history of COVID-19: Secondary | ICD-10-CM | POA: Diagnosis not present

## 2020-05-23 DIAGNOSIS — Z79899 Other long term (current) drug therapy: Secondary | ICD-10-CM | POA: Insufficient documentation

## 2020-05-23 DIAGNOSIS — M109 Gout, unspecified: Secondary | ICD-10-CM | POA: Insufficient documentation

## 2020-05-23 DIAGNOSIS — Z8701 Personal history of pneumonia (recurrent): Secondary | ICD-10-CM | POA: Diagnosis not present

## 2020-05-23 DIAGNOSIS — D45 Polycythemia vera: Secondary | ICD-10-CM

## 2020-05-23 LAB — CBC WITH DIFFERENTIAL/PLATELET
Abs Immature Granulocytes: 0.29 10*3/uL — ABNORMAL HIGH (ref 0.00–0.07)
Basophils Absolute: 0.1 10*3/uL (ref 0.0–0.1)
Basophils Relative: 1 %
Eosinophils Absolute: 0.2 10*3/uL (ref 0.0–0.5)
Eosinophils Relative: 2 %
HCT: 42.5 % (ref 39.0–52.0)
Hemoglobin: 12.6 g/dL — ABNORMAL LOW (ref 13.0–17.0)
Immature Granulocytes: 2 %
Lymphocytes Relative: 7 %
Lymphs Abs: 1 10*3/uL (ref 0.7–4.0)
MCH: 27.1 pg (ref 26.0–34.0)
MCHC: 29.6 g/dL — ABNORMAL LOW (ref 30.0–36.0)
MCV: 91.4 fL (ref 80.0–100.0)
Monocytes Absolute: 0.3 10*3/uL (ref 0.1–1.0)
Monocytes Relative: 2 %
Neutro Abs: 13.8 10*3/uL — ABNORMAL HIGH (ref 1.7–7.7)
Neutrophils Relative %: 86 %
Platelets: 379 10*3/uL (ref 150–400)
RBC: 4.65 MIL/uL (ref 4.22–5.81)
RDW: 15.5 % (ref 11.5–15.5)
WBC: 15.9 10*3/uL — ABNORMAL HIGH (ref 4.0–10.5)
nRBC: 0 % (ref 0.0–0.2)

## 2020-05-23 MED ORDER — FENTANYL CITRATE (PF) 100 MCG/2ML IJ SOLN
INTRAMUSCULAR | Status: AC | PRN
  Administered 2020-05-23 (×2): 50 ug via INTRAVENOUS

## 2020-05-23 MED ORDER — FENTANYL CITRATE (PF) 100 MCG/2ML IJ SOLN
INTRAMUSCULAR | Status: AC
  Filled 2020-05-23: qty 2

## 2020-05-23 MED ORDER — SODIUM CHLORIDE 0.9 % IV SOLN: INTRAVENOUS | Status: DC

## 2020-05-23 MED ORDER — MIDAZOLAM HCL 2 MG/2ML IJ SOLN
INTRAMUSCULAR | Status: AC | PRN
  Administered 2020-05-23 (×2): 1 mg via INTRAVENOUS

## 2020-05-23 MED ORDER — HEPARIN SOD (PORK) LOCK FLUSH 100 UNIT/ML IV SOLN
INTRAVENOUS | Status: AC
  Filled 2020-05-23: qty 5

## 2020-05-23 MED ORDER — MIDAZOLAM HCL 2 MG/2ML IJ SOLN
INTRAMUSCULAR | Status: AC
  Filled 2020-05-23: qty 2

## 2020-05-23 NOTE — H&P (Signed)
Chief Complaint: Patient was seen in consultation today for bone marrow biopsy at the request of Brahmanday,Govinda R  Referring Physician(s): Brahmanday,Govinda R  Patient Status: [facility] - Out-pt  History of Present Illness: Derek [name] is a 64 y.o. male with a history of polycythemia vera, Jak2+, who has had fever of unknown origin with nearly daily fevers in late afternoon/evenings since recovering from COVID-19 pneumonia in December. Derek [name] was hospitalized from 12/26-12/31/20 with COVID pneumonia. Has had a prior BM biopsy on 05/07/18 demonstrating panmyeloid proliferation and mild fibrosis. Has known splenomegaly.    Past Medical History:  Diagnosis Date  . Arthritis   . GERD (gastroesophageal reflux disease)   . Gout   . Low serum testosterone level   . Polycythemia vera ([place])   . Polycythemia, secondary 06/08/2015  . Pulmonary nodules   . Thoracic spine fracture (HCC)    compression fracture following a fall    Past Surgical History:  Procedure Laterality Date  . COLONOSCOPY  2013    Allergies: Penicillin g  Medications: Prior to Admission medications   Medication Sig Start Date End Date Taking? Authorizing Provider  acetaminophen (TYLENOL) 500 MG tablet Take 500 mg by mouth every 6 (six) hours as needed for fever.   Yes [provider]  amLODipine (NORVASC) 5 MG tablet Take 5 mg daily by mouth. 10/12/17  Yes [provider]  aspirin EC 81 MG tablet Take 81 mg daily by mouth.    Yes [provider]  Cholecalciferol (VITAMIN D-1000 MAX ST) 1000 units tablet Take 1,000 Units daily by mouth.    Yes [provider]  Esomeprazole Magnesium (NEXIUM PO) Take 2 capsules by mouth daily.    Yes [provider]  hydroxyurea (HYDREA) 500 MG capsule May take with food to minimize GI side effects. 03/02/20  Yes Cammie Sickle, MD  Multiple Vitamins-Minerals (MULTIVITAMIN ADULT PO) Take 1 tablet by mouth 1 day or 1 dose.    Yes [provider]  ondansetron (ZOFRAN ODT) 4 MG disintegrating tablet Take 1 tablet (4 mg total) by mouth every 8 (eight) hours as needed for nausea or vomiting. 12/08/19  Yes Duffy Bruce, MD     Family History  Problem Relation Age of Onset  . Throat cancer Other        uncle  . Migraines Neg Hx   . Multiple sclerosis Neg Hx   . Neurofibromatosis Neg Hx   . Parkinsonism Neg Hx   . Seizures Neg Hx   . Neuropathy Neg Hx     Social History   Socioeconomic History  . Marital status: Divorced    Spouse name: Not on file  . Number of children: 2  . Years of education: 75  . Highest education level: Not on file  Occupational History  . Occupation: [employer]  Tobacco Use  . Smoking status: Never Smoker  . Smokeless tobacco: Never Used  Substance and Sexual Activity  . Alcohol use: Yes    Alcohol/week: 0.0 standard drinks    Comment: occasional alcohol use  . Drug use: No  . Sexual activity: Not on file  Other Topics Concern  . Not on file  Social History Narrative   Lives alone   Caffeine use:  Very little per pt   1 glass tea.day   Social Determinants of Health   Financial Resource Strain:   . Difficulty of Paying Living Expenses:   Food Insecurity:   . Worried About Charity fundraiser  in the Last Year:   . [place] in the Last Year:   Transportation Needs:   . Film/video editor (Medical):   Marland Kitchen Lack of Transportation (Non-Medical):   Physical Activity:   . Days of Exercise per Week:   . Minutes of Exercise per Session:   Stress:   . Feeling of Stress :   Social Connections:   . Frequency of Communication with Friends and Family:   . Frequency of Social Gatherings with Friends and Family:   . Attends Religious Services:   . Active Member of Clubs or Organizations:   . Attends Archivist Meetings:   Marland Kitchen Marital Status:     Review of Systems: A 12 point ROS discussed and pertinent positives are indicated in the HPI above.   All other systems are negative.  Review of Systems  Constitutional: Positive for fever. Negative for activity change, fatigue and unexpected weight change.  Respiratory: Negative.   Cardiovascular: Negative.   Gastrointestinal: Negative for blood in stool, diarrhea, nausea and vomiting.       Occasional mild abdominal discomfort that the patient attributes to chronic splenomegaly.  Genitourinary: Negative.   Musculoskeletal: Positive for arthralgias.  Neurological: Negative.   Hematological: Negative.     Vital Signs: BP (!) 145/92   Pulse 95   Temp (!) 100.8 F (38.2 C) (Oral)   Resp 15   Ht '5\' 8"'  (1.727 m)   Wt 83.9 kg   SpO2 99%   BMI 28.13 kg/m   Physical Exam Vitals reviewed.  Constitutional:      General: He is not in acute distress.    Appearance: Normal appearance. He is not ill-appearing, toxic-appearing or diaphoretic.  HENT:     Head: Normocephalic and atraumatic.  Cardiovascular:     Rate and Rhythm: Normal rate and regular rhythm.     Heart sounds: Normal heart sounds. No murmur. No friction rub. No gallop.   Pulmonary:     Effort: Pulmonary effort is normal. No respiratory distress.     Breath sounds: Normal breath sounds. No stridor. No wheezing, rhonchi or rales.  Abdominal:     General: There is no distension.     Palpations: Abdomen is soft.     Tenderness: There is no abdominal tenderness. There is no guarding or rebound.  Musculoskeletal:        General: No swelling.     Cervical back: Neck supple. No tenderness.  Lymphadenopathy:     Cervical: No cervical adenopathy.  Skin:    General: Skin is warm and dry.  Neurological:     General: No focal deficit present.     Mental Status: He is alert and oriented to person, place, and time.     Imaging: No results found.  Labs:  CBC: Recent Labs    12/17/19 0455 01/06/20 1252 03/02/20 1257 05/04/20 1312 05/05/20 1146 05/23/20 0810  WBC 10.2  --  7.8  --  10.5 15.9*  HGB 12.3*   < >  13.0 12.1* 12.7* 12.6*  HCT 41.0   < > 43.3 40.8 43.4 42.5  PLT 585*  --  474*  --  430* 379   < > = values in this interval not displayed.    COAGS: No results for input(s): INR, APTT in the last 8760 hours.  BMP: Recent Labs    12/15/19 0344 12/16/19 0355 12/17/19 0455 03/02/20 1257  NA 139 139 139 139  K 4.8 4.9 4.8 4.0  CL 104 107 105 104  CO2 '26 24 24 25  ' GLUCOSE 140* 145* 112* 142*  BUN 22 24* 24* 12  CALCIUM 8.5* 8.5* 8.3* 8.7*  CREATININE 0.86 0.82 0.77 0.86  GFRNONAA >60 >60 >60 >60  GFRAA >60 >60 >60 >60    LIVER FUNCTION TESTS: Recent Labs    12/15/19 0344 12/16/19 0355 12/17/19 0455 03/02/20 1257  BILITOT 1.1 1.0 1.0 0.8  AST 33 '20 19 19  ' ALT 46* 40 38 14  ALKPHOS 59 58 58 94  PROT 5.7* 5.6* 5.1* 6.6  ALBUMIN 3.1* 3.0* 2.9* 3.8    Assessment and Plan:  For CT guided bone marrow biopsy today. Risks and benefits of bone marrow biopsy was discussed with the patient and/or patient's family including, but not limited to bleeding, infection, damage to adjacent structures or low yield requiring additional tests. All of the questions were answered and there is agreement to proceed. Consent signed and in chart.  Thank you for this interesting consult.  I greatly enjoyed meeting Derek [name] and look forward to participating in their care.  A copy of this report was sent to the requesting provider on this date.  Electronically Signed: Azzie Roup, MD 05/23/2020, 8:35 AM     I spent a total of 15 Minutes  in face to face in clinical consultation, greater than 50% of which was counseling/coordinating care for bone marrow biopsy.

## 2020-05-23 NOTE — Procedures (Signed)
Interventional Radiology Procedure Note  Procedure: CT guided bone marrow aspiration and biopsy  Complications: None  EBL: < 10 mL  Findings: Aspirate and core biopsy performed of bone marrow in right iliac bone.  Plan: Bedrest supine x 1 hrs  Everet Flagg T. Korra Christine, M.D Pager:  [phone]   

## 2020-05-23 NOTE — Progress Notes (Signed)
Patient clinically stable post BMB per Dr Kathlene Cote, tolerated well. Denies complaints post procedure. Awake/alert and oriented post procedure. Received Versed 2mg  along with Fentanyl 142mcg IV for procedure. . Report given to Carlynn Spry RN post procedure in specials with questions answered.

## 2020-05-23 NOTE — Discharge Instructions (Signed)
Bone Marrow Aspiration and Bone Marrow Biopsy, Adult, Care After °This sheet gives you information about how to care for yourself after your procedure. Your health care provider may also give you more specific instructions. If you have problems or questions, contact your health care provider. °What can I expect after the procedure? °After the procedure, it is common to have: °· Mild pain and tenderness. °· Swelling. °· Bruising. °Follow these instructions at home: °Puncture site care ° °· Follow instructions from your health care provider about how to take care of the puncture site. Make sure you: °? Wash your hands with soap and water before and after you change your bandage (dressing). If soap and water are not available, use hand sanitizer. °? Change your dressing as told by your health care provider. °· Check your puncture site every day for signs of infection. Check for: °? More redness, swelling, or pain. °? Fluid or blood. °? Warmth. °? Pus or a bad smell. °Activity °· Return to your normal activities as told by your health care provider. Ask your health care provider what activities are safe for you. °· Do not lift anything that is heavier than 10 lb (4.5 kg), or the limit that you are told, until your health care provider says that it is safe. °· Do not drive for 24 hours if you were given a sedative during your procedure. °General instructions ° °· Take over-the-counter and prescription medicines only as told by your health care provider. °· Do not take baths, swim, or use a hot tub until your health care provider approves. Ask your health care provider if you may take showers. You may only be allowed to take sponge baths. °· If directed, put ice on the affected area. To do this: °? Put ice in a plastic bag. °? Place a towel between your skin and the bag. °? Leave the ice on for 20 minutes, 2-3 times a day. °· Keep all follow-up visits as told by your health care provider. This is important. °Contact a  health care provider if: °· Your pain is not controlled with medicine. °· You have a fever. °· You have more redness, swelling, or pain around the puncture site. °· You have fluid or blood coming from the puncture site. °· Your puncture site feels warm to the touch. °· You have pus or a bad smell coming from the puncture site. °Summary °· After the procedure, it is common to have mild pain, tenderness, swelling, and bruising. °· Follow instructions from your health care provider about how to take care of the puncture site and what activities are safe for you. °· Take over-the-counter and prescription medicines only as told by your health care provider. °· Contact a health care provider if you have any signs of infection, such as fluid or blood coming from the puncture site. °This information is not intended to replace advice given to you by your health care provider. Make sure you discuss any questions you have with your health care provider. °Document Revised: 04/21/2019 Document Reviewed: 04/21/2019 °Elsevier Patient Education © 2020 [employer] ° °

## 2020-05-25 ENCOUNTER — Telehealth: Payer: Self-pay | Admitting: Internal Medicine

## 2020-05-25 NOTE — Telephone Encounter (Signed)
On 6/09-spoke to patient regarding results of the bone marrow biopsy no obvious progression to acute process.  Will discuss further at next office visit.  FYI

## 2020-05-30 ENCOUNTER — Encounter: Payer: Self-pay | Admitting: Internal Medicine

## 2020-05-30 ENCOUNTER — Other Ambulatory Visit: Payer: Self-pay

## 2020-05-30 ENCOUNTER — Inpatient Hospital Stay: Payer: No Typology Code available for payment source | Attending: Internal Medicine | Admitting: Internal Medicine

## 2020-05-30 DIAGNOSIS — R509 Fever, unspecified: Secondary | ICD-10-CM | POA: Diagnosis not present

## 2020-05-30 DIAGNOSIS — Z79899 Other long term (current) drug therapy: Secondary | ICD-10-CM | POA: Diagnosis not present

## 2020-05-30 DIAGNOSIS — D45 Polycythemia vera: Secondary | ICD-10-CM | POA: Diagnosis not present

## 2020-05-30 NOTE — Progress Notes (Signed)
[place] OFFICE PROGRESS NOTE  Patient Care Team: Dion Body, MD as PCP - General (Family Medicine)  Cancer Staging No matching staging information was found for the patient.   Oncology History Overview Note  # 2014- POLYCYTHEMIA VERA - JAK-2 Positive; on Hydrea 568m/day; aspirin 81 mg a day; phlebotomy for Hct >43 [headaches];   # MAY 2019- BMBx- pan-hypercellular; no evidence of significant fibrosis; MAY [place] 1389 cc/ splenomegaly; Oct 11th 2019- Increase in splenomegaly; Increased Hydrea 15068mday.   # nonarteritic ischemic optic neuropathy-Left eye [place]may 2018[place] [facility] [place]    DIAGNOSIS: '[ ]'  POLYCYTHEMIA VERA  ;GOALS: control  CURRENT/MOST RECENT THERAPY '[ ]'  Hydrea        Polycythemia vera (HCC)      INTERVAL HISTORY:  Derek Gadson89.o.  male pleasant patient above history of polycythemia vera on hydrea is here for follow-up/review results of the bone marrow biopsy.  Patient continues to have low-grade fevers up to 101; 2-3 times a week.  Improved with taking ibuprofen/Tylenol.  No night sweats.  Appetite is good.  No weight loss.  No fatigue.  No new shortness of the cough..  Review of Systems  Constitutional: Positive for fever. Negative for chills, diaphoresis and weight loss.  HENT: Negative for nosebleeds and sore throat.   Eyes: Negative for double vision.  Respiratory: Negative for cough, hemoptysis, sputum production, shortness of breath and wheezing.   Cardiovascular: Negative for chest pain, palpitations, orthopnea and leg swelling.  Gastrointestinal: Negative for abdominal pain, blood in stool, constipation, diarrhea, heartburn, melena, nausea and vomiting.  Genitourinary: Negative for dysuria, frequency and urgency.  Musculoskeletal: Negative for back pain and joint pain.  Skin: Negative.  Negative for itching and rash.  Neurological: Negative for  dizziness, tingling, focal weakness, weakness and headaches.  Endo/Heme/Allergies: Does not bruise/bleed easily.  Psychiatric/Behavioral: Negative for depression. The patient is not nervous/anxious and does not have insomnia.       PAST MEDICAL HISTORY :  Past Medical History:  Diagnosis Date  . Arthritis   . GERD (gastroesophageal reflux disease)   . Gout   . Low serum testosterone level   . [place]  . Polycythemia, secondary 06/08/2015  . Pulmonary nodules   . Thoracic spine fracture (HCC)    compression fracture following a fall    PAST SURGICAL HISTORY :   Past Surgical History:  Procedure Laterality Date  . COLONOSCOPY  2013    FAMILY HISTORY :   Family History  Problem Relation Age of Onset  . Throat cancer Other        uncle  . Migraines Neg Hx   . Multiple sclerosis Neg Hx   . Neurofibromatosis Neg Hx   . Parkinsonism Neg Hx   . Seizures Neg Hx   . Neuropathy Neg Hx     SOCIAL HISTORY:   Social History   Tobacco Use  . Smoking status: Never Smoker  . Smokeless tobacco: Never Used  Substance Use Topics  . Alcohol use: Yes    Alcohol/week: 0.0 standard drinks    Comment: occasional alcohol use  . Drug use: No    ALLERGIES:  is allergic to penicillin g.  MEDICATIONS:  Current Outpatient Medications  Medication Sig Dispense Refill  . acetaminophen (TYLENOL) 500 MG tablet Take 500 mg by mouth every 6 (six) hours as needed for fever.    . Marland KitchenmLODipine (NORVASC) 5 MG tablet Take 5 mg daily by mouth.  11  . aspirin EC 81 MG tablet Take 81 mg daily by mouth.     . Cholecalciferol (VITAMIN D-1000 MAX ST) 1000 units tablet Take 1,000 Units daily by mouth.     . Esomeprazole Magnesium (NEXIUM PO) Take 2 capsules by mouth daily.     . hydroxyurea (HYDREA) 500 MG capsule May take with food to minimize GI side effects. 810 capsule 1  . Multiple Vitamins-Minerals (MULTIVITAMIN ADULT PO) Take 1 tablet by mouth 1 day or 1 dose.    . ondansetron  (ZOFRAN ODT) 4 MG disintegrating tablet Take 1 tablet (4 mg total) by mouth every 8 (eight) hours as needed for nausea or vomiting. (Patient not taking: Reported on 05/30/2020) 20 tablet 0   No current facility-administered medications for this visit.    PHYSICAL EXAMINATION: ECOG PERFORMANCE STATUS: 0 - Asymptomatic  BP 135/83   Pulse 91   Temp (!) 97.1 F (36.2 C) (Oral)   Ht '5\' 8"'  (1.727 m)   Wt 189 lb (85.7 kg)   BMI 28.74 kg/m   Filed Weights   05/30/20 1525  Weight: 189 lb (85.7 kg)    Physical Exam HENT:     Head: Normocephalic and atraumatic.     Mouth/Throat:     Pharynx: No oropharyngeal exudate.  Eyes:     Pupils: Pupils are equal, round, and reactive to light.  Cardiovascular:     Rate and Rhythm: Normal rate and regular rhythm.  Pulmonary:     Effort: No respiratory distress.     Breath sounds: No wheezing.  Abdominal:     General: Bowel sounds are normal. There is no distension.     Palpations: Abdomen is soft. There is no mass.     Tenderness: There is no abdominal tenderness. There is no guarding or rebound.     Comments: Positive for mild splenomegaly.  Musculoskeletal:        General: No tenderness. Normal range of motion.     Cervical back: Normal range of motion and neck supple.  Skin:    General: Skin is warm.  Neurological:     Mental Status: He is alert and oriented to person, place, and time.  Psychiatric:        Mood and Affect: Affect normal.     LABORATORY DATA:  I have reviewed the data as listed    Component Value Date/Time   NA 139 03/02/2020 1257   NA 140 03/02/2014 0835   K 4.0 03/02/2020 1257   K 3.8 03/02/2014 0835   CL 104 03/02/2020 1257   CL 109 (H) 03/02/2014 0835   CO2 25 03/02/2020 1257   CO2 26 03/02/2014 0835   GLUCOSE 142 (H) 03/02/2020 1257   GLUCOSE 108 (H) 03/02/2014 0835   BUN 12 03/02/2020 1257   BUN 9 03/02/2014 0835   CREATININE 0.86 03/02/2020 1257   CREATININE 0.99 03/02/2014 0835   CALCIUM 8.7 (L)  03/02/2020 1257   CALCIUM 8.6 03/02/2014 0835   PROT 6.6 03/02/2020 1257   PROT 7.0 03/02/2014 0835   ALBUMIN 3.8 03/02/2020 1257   ALBUMIN 3.7 03/02/2014 0835   AST 19 03/02/2020 1257   AST 20 03/02/2014 0835   ALT 14 03/02/2020 1257   ALT 24 03/02/2014 0835   ALKPHOS 94 03/02/2020 1257   ALKPHOS 96 03/02/2014 0835   BILITOT 0.8 03/02/2020 1257   BILITOT 0.6 03/02/2014 0835   GFRNONAA >60 03/02/2020 1257   GFRNONAA >60 03/02/2014 0835   GFRAA >60  03/02/2020 1257   GFRAA >60 03/02/2014 0835    No results found for: SPEP, UPEP  Lab Results  Component Value Date   WBC 15.9 (H) 05/23/2020   NEUTROABS 13.8 (H) 05/23/2020   HGB 12.6 (L) 05/23/2020   HCT 42.5 05/23/2020   MCV 91.4 05/23/2020   PLT 379 05/23/2020      Chemistry      Component Value Date/Time   NA 139 03/02/2020 1257   NA 140 03/02/2014 0835   K 4.0 03/02/2020 1257   K 3.8 03/02/2014 0835   CL 104 03/02/2020 1257   CL 109 (H) 03/02/2014 0835   CO2 25 03/02/2020 1257   CO2 26 03/02/2014 0835   BUN 12 03/02/2020 1257   BUN 9 03/02/2014 0835   CREATININE 0.86 03/02/2020 1257   CREATININE 0.99 03/02/2014 0835      Component Value Date/Time   CALCIUM 8.7 (L) 03/02/2020 1257   CALCIUM 8.6 03/02/2014 0835   ALKPHOS 94 03/02/2020 1257   ALKPHOS 96 03/02/2014 0835   AST 19 03/02/2020 1257   AST 20 03/02/2014 0835   ALT 14 03/02/2020 1257   ALT 24 03/02/2014 0835   BILITOT 0.8 03/02/2020 1257   BILITOT 0.6 03/02/2014 0835       RADIOGRAPHIC STUDIES: I have personally reviewed the radiological images as listed and agreed with the findings in the report. No results found.   ASSESSMENT & PLAN:  Polycythemia vera ([place]) # Polycythemia vera Jak 2+; on Hydrea 500 mg twice a day; except Saturday Sunday-extra pill.  December 2020-spleen volume 1140; stable. June 2021-bone marrow biopsy persistent myeloproliferative disease; no evidence of transformation or leukemia.  Will review at tumor  conference.  #Today hemoglobin is 12.3 hematocrit 43.  Hold off phlebotomy.  Continue Hydrea/aspirin.  #Fevers-unclear etiology.  Post Covid versus progressive polycythemia vera.  Recommend bringing a fever diary.  Would recommend ultrasound of the spleen.  Discussed option of using Jakafi for Hydrea resistant polycythemia vera.  Again discussed the potential side effects including but not limited to shingles.  Discussed with ID, Dr. Steva Ready.  #Disposition # US spleen ASAP # as planned Follow-up-1 months/MD-CBC/CMP LDH;CRP; possible phlebotomy;  Dr.B   Orders Placed This Encounter  Procedures  . US SPLEEN (ABDOMEN LIMITED)    Standing Status:   Future    Standing Expiration Date:   05/30/2021    Order Specific Question:   Reason for Exam (SYMPTOM  OR DIAGNOSIS REQUIRED)    Answer:   Polycythemia vera; Splenomegaly    Order Specific Question:   Preferred imaging location?    Answer:   [facility]   All questions were answered. The patient knows to call the clinic with any problems, questions or concerns.      Cammie Sickle, MD 06/06/2020 4:51 PM

## 2020-05-30 NOTE — Assessment & Plan Note (Addendum)
#  Polycythemia vera Jak 2+; on Hydrea 500 mg twice a day; except Saturday Sunday-extra pill.  December 2020-spleen volume 1140; stable. June 2021-bone marrow biopsy persistent myeloproliferative disease; no evidence of transformation or leukemia.  Will review at tumor conference.  #Today hemoglobin is 12.3 hematocrit 43.  Hold off phlebotomy.  Continue Hydrea/aspirin.  #Fevers-unclear etiology.  Post Covid versus progressive polycythemia vera.  Recommend bringing a fever diary.  Would recommend ultrasound of the spleen.  Discussed option of using Jakafi for Hydrea resistant polycythemia vera.  Again discussed the potential side effects including but not limited to shingles.  Discussed with ID, Dr. Steva Ready.  #Disposition # US spleen ASAP # as planned Follow-up-1 months/MD-CBC/CMP LDH;CRP; possible phlebotomy;  Dr.B

## 2020-06-02 ENCOUNTER — Encounter (HOSPITAL_COMMUNITY): Payer: Self-pay | Admitting: Internal Medicine

## 2020-06-03 ENCOUNTER — Other Ambulatory Visit: Payer: Self-pay | Admitting: Internal Medicine

## 2020-06-28 ENCOUNTER — Encounter: Payer: Self-pay | Admitting: Internal Medicine

## 2020-06-28 DIAGNOSIS — Z8739 Personal history of other diseases of the musculoskeletal system and connective tissue: Secondary | ICD-10-CM | POA: Insufficient documentation

## 2020-06-28 NOTE — Progress Notes (Signed)
Patient prescreened for appointment. Patient has no concerns or questions.  

## 2020-06-29 ENCOUNTER — Inpatient Hospital Stay: Payer: No Typology Code available for payment source

## 2020-06-29 ENCOUNTER — Ambulatory Visit: Admission: RE | Admit: 2020-06-29 | Payer: No Typology Code available for payment source | Source: Ambulatory Visit

## 2020-06-29 ENCOUNTER — Other Ambulatory Visit: Payer: Self-pay

## 2020-06-29 ENCOUNTER — Inpatient Hospital Stay: Payer: No Typology Code available for payment source | Attending: Internal Medicine

## 2020-06-29 ENCOUNTER — Inpatient Hospital Stay (HOSPITAL_BASED_OUTPATIENT_CLINIC_OR_DEPARTMENT_OTHER): Payer: No Typology Code available for payment source | Admitting: Internal Medicine

## 2020-06-29 DIAGNOSIS — Z79899 Other long term (current) drug therapy: Secondary | ICD-10-CM | POA: Insufficient documentation

## 2020-06-29 DIAGNOSIS — Z7982 Long term (current) use of aspirin: Secondary | ICD-10-CM | POA: Insufficient documentation

## 2020-06-29 DIAGNOSIS — D45 Polycythemia vera: Secondary | ICD-10-CM | POA: Diagnosis not present

## 2020-06-29 DIAGNOSIS — R109 Unspecified abdominal pain: Secondary | ICD-10-CM | POA: Insufficient documentation

## 2020-06-29 DIAGNOSIS — R509 Fever, unspecified: Secondary | ICD-10-CM | POA: Insufficient documentation

## 2020-06-29 LAB — CBC WITH DIFFERENTIAL/PLATELET
Abs Immature Granulocytes: 0.13 10*3/uL — ABNORMAL HIGH (ref 0.00–0.07)
Basophils Absolute: 0.1 10*3/uL (ref 0.0–0.1)
Basophils Relative: 1 %
Eosinophils Absolute: 0.1 10*3/uL (ref 0.0–0.5)
Eosinophils Relative: 1 %
HCT: 40.8 % (ref 39.0–52.0)
Hemoglobin: 12.4 g/dL — ABNORMAL LOW (ref 13.0–17.0)
Immature Granulocytes: 1 %
Lymphocytes Relative: 11 %
Lymphs Abs: 1 10*3/uL (ref 0.7–4.0)
MCH: 27 pg (ref 26.0–34.0)
MCHC: 30.4 g/dL (ref 30.0–36.0)
MCV: 88.9 fL (ref 80.0–100.0)
Monocytes Absolute: 0.4 10*3/uL (ref 0.1–1.0)
Monocytes Relative: 4 %
Neutro Abs: 7.7 10*3/uL (ref 1.7–7.7)
Neutrophils Relative %: 82 %
Platelets: 370 10*3/uL (ref 150–400)
RBC: 4.59 MIL/uL (ref 4.22–5.81)
RDW: 15.2 % (ref 11.5–15.5)
WBC: 9.4 10*3/uL (ref 4.0–10.5)
nRBC: 0 % (ref 0.0–0.2)

## 2020-06-29 LAB — COMPREHENSIVE METABOLIC PANEL
ALT: 13 U/L (ref 0–44)
AST: 18 U/L (ref 15–41)
Albumin: 3.8 g/dL (ref 3.5–5.0)
Alkaline Phosphatase: 98 U/L (ref 38–126)
Anion gap: 9 (ref 5–15)
BUN: 10 mg/dL (ref 8–23)
CO2: 27 mmol/L (ref 22–32)
Calcium: 8.5 mg/dL — ABNORMAL LOW (ref 8.9–10.3)
Chloride: 104 mmol/L (ref 98–111)
Creatinine, Ser: 0.92 mg/dL (ref 0.61–1.24)
GFR calc Af Amer: >60 mL/min (ref >60)
GFR calc non Af Amer: >60 mL/min (ref >60)
Glucose, Bld: 99 mg/dL (ref 70–99)
Potassium: 4 mmol/L (ref 3.5–5.1)
Sodium: 140 mmol/L (ref 135–145)
Total Bilirubin: 0.9 mg/dL (ref 0.3–1.2)
Total Protein: 6.9 g/dL (ref 6.5–8.1)

## 2020-06-29 LAB — LACTATE DEHYDROGENASE: LDH: 165 U/L (ref 98–192)

## 2020-06-29 NOTE — Assessment & Plan Note (Addendum)
#  Polycythemia vera Jak 2+; on Hydrea 500 mg twice a day; except Saturday Sunday-extra pill.  December 2020-spleen volume 1140;STABLE. June 2021-bone marrow biopsy persistent myeloproliferative disease; no evidence of transformation or leukemia. STABLE.   #Today hemoglobin is 12.4 hematocrit 40.8.  Hold off phlebotomy.  Continue Hydrea/aspirin.  Again discussed if fevers attributed to polycythemia vera-would recommend discontinuation of Hydrea; recommend Jakafi. AWait US spleen.  Patient not too keen with Jakafi-given the concerns for shingles.  #Intermittent abdominal discomfort-unclear etiology awaiting EGD colonoscopy on July 28.  #Low grade Fevers [99- 100.4 at most]-unclear etiology.  Post Covid versus progressive polycythemia vera. Await US spleen.  See above.  # Shingles vaccine/Covid vaccine: recommend take covid vaccine.   #Disposition # HOLD phlebotomy # RE-schedule US spleen ASAP # Follow up TBD=;  Dr.B

## 2020-06-29 NOTE — Progress Notes (Signed)
[place] OFFICE PROGRESS NOTE  Patient Care Team: Derek Body, MD as PCP - General (Family Medicine) Derek Sickle, MD as Consulting Physician (Internal Medicine)  Cancer Staging No matching staging information was found for the patient.   Oncology History Overview Note  # 2014- POLYCYTHEMIA VERA - JAK-2 Positive; on Hydrea 540m/day; aspirin 81 mg a day; phlebotomy for Hct >43 [headaches];   # MAY 2019- BMBx- pan-hypercellular; no evidence of significant fibrosis; MAY [place] 1389 cc/ splenomegaly; Oct 11th 2019- Increase in splenomegaly; Increased Hydrea 15060mday.   # nonarteritic ischemic optic neuropathy-Left eye [may 2018; [facility] ---------------------------------------    DIAGNOSIS: '[ ]'  POLYCYTHEMIA VERA  ;GOALS: control  CURRENT/MOST RECENT THERAPY '[ ]'  Hydrea        Polycythemia vera (HCC)      INTERVAL HISTORY:  Derek Balestrieri89.o.  male pleasant patient above history of polycythemia vera on hydrea is here for follow-up.  Patient was supposed to get a ultrasound of the spleen; not done.  Patient states he was not aware at appointment.  Patient continues to have low-grade fevers up to 101 2-3 times a week.  When he gets to 99 he takes ibuprofen/Tylenol results.  He is able to sleep well.  No weight loss.  No loss of appetite.  Otherwise no new fatigue or shortness of breath or cough.  Patient also complains of vague abdominal discomfort in the epigastric region comes and goes.  Evaluated by GI-awaiting EGD colonoscopy end of the month.  Review of Systems  Constitutional: Positive for fever. Negative for chills, diaphoresis and weight loss.  HENT: Negative for nosebleeds and sore throat.   Eyes: Negative for double vision.  Respiratory: Negative for cough, hemoptysis, sputum production, shortness of breath and wheezing.   Cardiovascular: Negative for chest pain, palpitations, orthopnea and  leg swelling.  Gastrointestinal: Positive for abdominal pain. Negative for blood in stool, constipation, diarrhea, heartburn, melena, nausea and vomiting.  Genitourinary: Negative for dysuria, frequency and urgency.  Musculoskeletal: Negative for back pain and joint pain.  Skin: Negative.  Negative for itching and rash.  Neurological: Negative for dizziness, tingling, focal weakness, weakness and headaches.  Endo/Heme/Allergies: Does not bruise/bleed easily.  Psychiatric/Behavioral: Negative for depression. The patient is not nervous/anxious and does not have insomnia.       PAST MEDICAL HISTORY :  Past Medical History:  Diagnosis Date  . Arthritis   . GERD (gastroesophageal reflux disease)   . Gout   . Low serum testosterone level   . [place]  . Polycythemia, secondary 06/08/2015  . Pulmonary nodules   . Thoracic spine fracture (HCC)    compression fracture following a fall    PAST SURGICAL HISTORY :   Past Surgical History:  Procedure Laterality Date  . COLONOSCOPY  2013    FAMILY HISTORY :   Family History  Problem Relation Age of Onset  . Throat cancer Other        uncle  . Migraines Neg Hx   . Multiple sclerosis Neg Hx   . Neurofibromatosis Neg Hx   . Parkinsonism Neg Hx   . Seizures Neg Hx   . Neuropathy Neg Hx     SOCIAL HISTORY:   Social History   Tobacco Use  . Smoking status: Never Smoker  . Smokeless tobacco: Never Used  Substance Use Topics  . Alcohol use: Yes    Alcohol/week: 0.0 standard drinks    Comment: occasional alcohol  use  . Drug use: No    ALLERGIES:  is allergic to penicillin g.  MEDICATIONS:  Current Outpatient Medications  Medication Sig Dispense Refill  . acetaminophen (TYLENOL) 500 MG tablet Take 500 mg by mouth every 6 (six) hours as needed for fever.    Marland Kitchen amLODipine (NORVASC) 5 MG tablet Take 5 mg daily by mouth.  11  . aspirin EC 81 MG tablet Take 81 mg daily by mouth.     . Baclofen 5 MG TABS Take 1 tablet  by mouth 2 (two) times daily.    . Cholecalciferol (VITAMIN D-1000 MAX ST) 1000 units tablet Take 1,000 Units daily by mouth.     . Esomeprazole Magnesium (NEXIUM PO) Take 2 capsules by mouth daily.     . hydroxyurea (HYDREA) 500 MG capsule May take with food to minimize GI side effects. 810 capsule 1  . meloxicam (MOBIC) 15 MG tablet Take 1 tablet by mouth daily with breakfast.    . Multiple Vitamins-Minerals (MULTIVITAMIN ADULT PO) Take 1 tablet by mouth 1 day or 1 dose.    . ondansetron (ZOFRAN ODT) 4 MG disintegrating tablet Take 1 tablet (4 mg total) by mouth every 8 (eight) hours as needed for nausea or vomiting. 20 tablet 0   No current facility-administered medications for this visit.    PHYSICAL EXAMINATION: ECOG PERFORMANCE STATUS: 0 - Asymptomatic  BP 137/87 (BP Location: Right Arm, Patient Position: Sitting)   Pulse 89   Temp 98.3 F (36.8 C) (Tympanic)   Resp 18   Wt 188 lb (85.3 kg)   SpO2 98%   BMI 28.59 kg/m   Filed Weights   06/29/20 1304  Weight: 188 lb (85.3 kg)    Physical Exam HENT:     Head: Normocephalic and atraumatic.     Mouth/Throat:     Pharynx: No oropharyngeal exudate.  Eyes:     Pupils: Pupils are equal, round, and reactive to light.  Cardiovascular:     Rate and Rhythm: Normal rate and regular rhythm.  Pulmonary:     Effort: No respiratory distress.     Breath sounds: No wheezing.  Abdominal:     General: Bowel sounds are normal. There is no distension.     Palpations: Abdomen is soft. There is no mass.     Tenderness: There is no abdominal tenderness. There is no guarding or rebound.     Comments: Positive for mild splenomegaly.  Musculoskeletal:        General: No tenderness. Normal range of motion.     Cervical back: Normal range of motion and neck supple.  Skin:    General: Skin is warm.  Neurological:     Mental Status: He is alert and oriented to person, place, and time.  Psychiatric:        Mood and Affect: Affect normal.      LABORATORY DATA:  I have reviewed the data as listed    Component Value Date/Time   NA 140 06/29/2020 1239   NA 140 03/02/2014 0835   K 4.0 06/29/2020 1239   K 3.8 03/02/2014 0835   CL 104 06/29/2020 1239   CL 109 (H) 03/02/2014 0835   CO2 27 06/29/2020 1239   CO2 26 03/02/2014 0835   GLUCOSE 99 06/29/2020 1239   GLUCOSE 108 (H) 03/02/2014 0835   BUN 10 06/29/2020 1239   BUN 9 03/02/2014 0835   CREATININE 0.92 06/29/2020 1239   CREATININE 0.99 03/02/2014 0835   CALCIUM 8.5 (  L) 06/29/2020 1239   CALCIUM 8.6 03/02/2014 0835   PROT 6.9 06/29/2020 1239   PROT 7.0 03/02/2014 0835   ALBUMIN 3.8 06/29/2020 1239   ALBUMIN 3.7 03/02/2014 0835   AST 18 06/29/2020 1239   AST 20 03/02/2014 0835   ALT 13 06/29/2020 1239   ALT 24 03/02/2014 0835   ALKPHOS 98 06/29/2020 1239   ALKPHOS 96 03/02/2014 0835   BILITOT 0.9 06/29/2020 1239   BILITOT 0.6 03/02/2014 0835   GFRNONAA >60 06/29/2020 1239   GFRNONAA >60 03/02/2014 0835   GFRAA >60 06/29/2020 1239   GFRAA >60 03/02/2014 0835    No results found for: SPEP, UPEP  Lab Results  Component Value Date   WBC 9.4 06/29/2020   NEUTROABS 7.7 06/29/2020   HGB 12.4 (L) 06/29/2020   HCT 40.8 06/29/2020   MCV 88.9 06/29/2020   PLT 370 06/29/2020      Chemistry      Component Value Date/Time   NA 140 06/29/2020 1239   NA 140 03/02/2014 0835   K 4.0 06/29/2020 1239   K 3.8 03/02/2014 0835   CL 104 06/29/2020 1239   CL 109 (H) 03/02/2014 0835   CO2 27 06/29/2020 1239   CO2 26 03/02/2014 0835   BUN 10 06/29/2020 1239   BUN 9 03/02/2014 0835   CREATININE 0.92 06/29/2020 1239   CREATININE 0.99 03/02/2014 0835      Component Value Date/Time   CALCIUM 8.5 (L) 06/29/2020 1239   CALCIUM 8.6 03/02/2014 0835   ALKPHOS 98 06/29/2020 1239   ALKPHOS 96 03/02/2014 0835   AST 18 06/29/2020 1239   AST 20 03/02/2014 0835   ALT 13 06/29/2020 1239   ALT 24 03/02/2014 0835   BILITOT 0.9 06/29/2020 1239   BILITOT 0.6 03/02/2014 0835        RADIOGRAPHIC STUDIES: I have personally reviewed the radiological images as listed and agreed with the findings in the report. No results found.   ASSESSMENT & PLAN:  Polycythemia vera ([place]) # Polycythemia vera Jak 2+; on Hydrea 500 mg twice a day; except Saturday Sunday-extra pill.  December 2020-spleen volume 1140;STABLE. June 2021-bone marrow biopsy persistent myeloproliferative disease; no evidence of transformation or leukemia. STABLE.   #Today hemoglobin is 12.4 hematocrit 40.8.  Hold off phlebotomy.  Continue Hydrea/aspirin.  Again discussed if fevers attributed to polycythemia vera-would recommend discontinuation of Hydrea; recommend Jakafi. AWait US spleen.  Patient not too keen with Jakafi-given the concerns for shingles.  #Intermittent abdominal discomfort-unclear etiology awaiting EGD colonoscopy on July 28.  #Low grade Fevers [99- 100.4 at most]-unclear etiology.  Post Covid versus progressive polycythemia vera. Await US spleen.  See above.  # Shingles vaccine/Covid vaccine: recommend take covid vaccine.   #Disposition # HOLD phlebotomy # RE-schedule US spleen ASAP # Follow up TBD=;  Dr.B   No orders of the defined types were placed in this encounter.  All questions were answered. The patient knows to call the clinic with any problems, questions or concerns.      Derek Sickle, MD 06/29/2020 1:57 PM

## 2020-06-30 ENCOUNTER — Ambulatory Visit
Admission: RE | Admit: 2020-06-30 | Discharge: 2020-06-30 | Disposition: A | Discharge status: Home / Self Care | Payer: No Typology Code available for payment source | Source: Ambulatory Visit | Attending: Internal Medicine | Admitting: Internal Medicine

## 2020-06-30 DIAGNOSIS — D45 Polycythemia vera: Secondary | ICD-10-CM | POA: Diagnosis not present

## 2020-07-04 ENCOUNTER — Telehealth: Payer: Self-pay | Admitting: Internal Medicine

## 2020-07-04 NOTE — Telephone Encounter (Signed)
On 7/16-spoke to patient regarding results of the ultrasound spleen; slightly increased in volume.  Discussed regarding role of Jakafi; patient reluctant.  States is not too bothered with his ongoing fevers/as it seemed to improve with NSAIDs.  Patient will follow up with me in approximately 1 month to discuss Jakafi.   FYI-Dr.Ravishankar.

## 2020-07-05 ENCOUNTER — Other Ambulatory Visit: Payer: Self-pay

## 2020-07-05 DIAGNOSIS — D45 Polycythemia vera: Secondary | ICD-10-CM

## 2020-07-05 DIAGNOSIS — D751 Secondary polycythemia: Secondary | ICD-10-CM

## 2020-07-05 NOTE — Telephone Encounter (Signed)
Labs ordered.

## 2020-07-05 NOTE — Telephone Encounter (Signed)
Labs- cbc/bmp;LDH- Thanks GB

## 2020-07-05 NOTE — Telephone Encounter (Signed)
Apts arranged 

## 2020-07-05 NOTE — Telephone Encounter (Signed)
Dr. B do you want any labs in 1 month?

## 2020-07-05 NOTE — Telephone Encounter (Signed)
Derek [name], please schedule lab/md follow-up with Dr. B in 1 month

## 2020-07-13 ENCOUNTER — Ambulatory Visit: Admit: 2020-07-13 | Payer: No Typology Code available for payment source

## 2020-07-13 SURGERY — EGD (ESOPHAGOGASTRODUODENOSCOPY)
Anesthesia: General

## 2020-07-14 LAB — SURGICAL PATHOLOGY

## 2020-08-02 ENCOUNTER — Inpatient Hospital Stay: Payer: No Typology Code available for payment source

## 2020-08-02 ENCOUNTER — Inpatient Hospital Stay: Payer: No Typology Code available for payment source | Admitting: Internal Medicine

## 2020-08-15 ENCOUNTER — Inpatient Hospital Stay (HOSPITAL_BASED_OUTPATIENT_CLINIC_OR_DEPARTMENT_OTHER): Payer: No Typology Code available for payment source | Admitting: Internal Medicine

## 2020-08-15 ENCOUNTER — Inpatient Hospital Stay: Payer: No Typology Code available for payment source

## 2020-08-15 ENCOUNTER — Inpatient Hospital Stay: Payer: No Typology Code available for payment source | Attending: Internal Medicine

## 2020-08-15 ENCOUNTER — Encounter: Payer: Self-pay | Admitting: Internal Medicine

## 2020-08-15 ENCOUNTER — Other Ambulatory Visit: Payer: Self-pay

## 2020-08-15 VITALS — BP 127/80 | HR 84 | Resp 20

## 2020-08-15 DIAGNOSIS — Z79899 Other long term (current) drug therapy: Secondary | ICD-10-CM | POA: Diagnosis not present

## 2020-08-15 DIAGNOSIS — D45 Polycythemia vera: Secondary | ICD-10-CM

## 2020-08-15 DIAGNOSIS — D751 Secondary polycythemia: Secondary | ICD-10-CM

## 2020-08-15 LAB — CBC WITH DIFFERENTIAL/PLATELET
Abs Immature Granulocytes: 0.05 10*3/uL (ref 0.00–0.07)
Basophils Absolute: 0.1 10*3/uL (ref 0.0–0.1)
Basophils Relative: 1 %
Eosinophils Absolute: 0.2 10*3/uL (ref 0.0–0.5)
Eosinophils Relative: 2 %
HCT: 44.4 % (ref 39.0–52.0)
Hemoglobin: 13.6 g/dL (ref 13.0–17.0)
Immature Granulocytes: 1 %
Lymphocytes Relative: 10 %
Lymphs Abs: 0.8 10*3/uL (ref 0.7–4.0)
MCH: 27.6 pg (ref 26.0–34.0)
MCHC: 30.6 g/dL (ref 30.0–36.0)
MCV: 90.2 fL (ref 80.0–100.0)
Monocytes Absolute: 0.3 10*3/uL (ref 0.1–1.0)
Monocytes Relative: 4 %
Neutro Abs: 6.6 10*3/uL (ref 1.7–7.7)
Neutrophils Relative %: 82 %
Platelets: 437 10*3/uL — ABNORMAL HIGH (ref 150–400)
RBC: 4.92 MIL/uL (ref 4.22–5.81)
RDW: 15.6 % — ABNORMAL HIGH (ref 11.5–15.5)
WBC: 8 10*3/uL (ref 4.0–10.5)
nRBC: 0 % (ref 0.0–0.2)

## 2020-08-15 LAB — BASIC METABOLIC PANEL
Anion gap: 8 (ref 5–15)
BUN: 12 mg/dL (ref 8–23)
CO2: 25 mmol/L (ref 22–32)
Calcium: 8.6 mg/dL — ABNORMAL LOW (ref 8.9–10.3)
Chloride: 108 mmol/L (ref 98–111)
Creatinine, Ser: 0.8 mg/dL (ref 0.61–1.24)
GFR calc Af Amer: >60 mL/min (ref >60)
GFR calc non Af Amer: >60 mL/min (ref >60)
Glucose, Bld: 135 mg/dL — ABNORMAL HIGH (ref 70–99)
Potassium: 3.9 mmol/L (ref 3.5–5.1)
Sodium: 141 mmol/L (ref 135–145)

## 2020-08-15 LAB — LACTATE DEHYDROGENASE: LDH: 143 U/L (ref 98–192)

## 2020-08-15 NOTE — Progress Notes (Signed)
[place] OFFICE PROGRESS NOTE  Patient Care Team: Dion Body, MD as PCP - General (Family Medicine) Cammie Sickle, MD as Consulting Physician (Internal Medicine)  Cancer Staging No matching staging information was found for the patient.   Oncology History Overview Note  # 2014- POLYCYTHEMIA VERA - JAK-2 Positive; on Hydrea 564m/day; aspirin 81 mg a day; phlebotomy for Hct >43 [headaches];   # MAY 2019- BMBx- pan-hypercellular; no evidence of significant fibrosis; MAY [place] 1389 cc/ splenomegaly; Oct 11th 2019- Increase in splenomegaly; Increased Hydrea 15080mday.   # nonarteritic ischemic optic neuropathy-Left eye [may 2018; [facility] ---------------------------------------    DIAGNOSIS: '[ ]'  POLYCYTHEMIA VERA  ;GOALS: control  CURRENT/MOST RECENT THERAPY '[ ]'  Hydrea        Polycythemia vera (HCC)      INTERVAL HISTORY:  Derek Senters89.o.  male pleasant patient above history of polycythemia vera on hydrea is here for follow-up.  Patient states that he took his recent Covid vaccination.  Did not have any serious side effects.  Of note patient noted to have improvement of his fevers.  He is not taking NSAIDs as often.  Denies any abdominal pain nausea vomiting.  Review of Systems  Constitutional: Positive for fever. Negative for chills, diaphoresis and weight loss.  HENT: Negative for nosebleeds and sore throat.   Eyes: Negative for double vision.  Respiratory: Negative for cough, hemoptysis, sputum production, shortness of breath and wheezing.   Cardiovascular: Negative for chest pain, palpitations, orthopnea and leg swelling.  Gastrointestinal: Positive for abdominal pain. Negative for blood in stool, constipation, diarrhea, heartburn, melena, nausea and vomiting.  Genitourinary: Negative for dysuria, frequency and urgency.  Musculoskeletal: Negative for back pain and joint pain.  Skin:  Negative.  Negative for itching and rash.  Neurological: Negative for dizziness, tingling, focal weakness, weakness and headaches.  Endo/Heme/Allergies: Does not bruise/bleed easily.  Psychiatric/Behavioral: Negative for depression. The patient is not nervous/anxious and does not have insomnia.       PAST MEDICAL HISTORY :  Past Medical History:  Diagnosis Date  . Arthritis   . GERD (gastroesophageal reflux disease)   . Gout   . Low serum testosterone level   . [place]mia [place]a ([place]  . Polycythemia, secondary 06/08/2015  . Pulmonary nodules   . Thoracic spine fracture (HCC)    compression fracture following a fall    PAST SURGICAL HISTORY :   Past Surgical History:  Procedure Laterality Date  . COLONOSCOPY  2013    FAMILY HISTORY :   Family History  Problem Relation Age of Onset  . Throat cancer Other        uncle  . Migraines Neg Hx   . Multiple sclerosis Neg Hx   . Neurofibromatosis Neg Hx   . Parkinsonism Neg Hx   . Seizures Neg Hx   . Neuropathy Neg Hx     SOCIAL HISTORY:   Social History   Tobacco Use  . Smoking status: Never Smoker  . Smokeless tobacco: Never Used  Substance Use Topics  . Alcohol use: Yes    Alcohol/week: 0.0 standard drinks    Comment: occasional alcohol use  . Drug use: No    ALLERGIES:  is allergic to penicillin g.  MEDICATIONS:  Current Outpatient Medications  Medication Sig Dispense Refill  . acetaminophen (TYLENOL) 500 MG tablet Take 500 mg by mouth every 6 (six) hours as needed for fever.    . Marland KitchenmLODipine (NORVASC) 5  MG tablet Take 5 mg daily by mouth.  11  . aspirin EC 81 MG tablet Take 81 mg daily by mouth.     . Cholecalciferol (VITAMIN D-1000 MAX ST) 1000 units tablet Take 1,000 Units daily by mouth.     . Esomeprazole Magnesium (NEXIUM PO) Take 2 capsules by mouth daily.     . hydroxyurea (HYDREA) 500 MG capsule May take with food to minimize GI side effects. 810 capsule 1  . meloxicam (MOBIC) 15 MG tablet Take 1  tablet by mouth daily with breakfast.    . Multiple Vitamins-Minerals (MULTIVITAMIN ADULT PO) Take 1 tablet by mouth 1 day or 1 dose.    . ondansetron (ZOFRAN ODT) 4 MG disintegrating tablet Take 1 tablet (4 mg total) by mouth every 8 (eight) hours as needed for nausea or vomiting. (Patient not taking: Reported on 08/15/2020) 20 tablet 0   No current facility-administered medications for this visit.    PHYSICAL EXAMINATION: ECOG PERFORMANCE STATUS: 0 - Asymptomatic  BP (!) 142/85 (Patient Position: Sitting)   Pulse 90   Temp (!) 96.6 F (35.9 C) (Tympanic)   Resp 20   Ht '5\' 8"'  (1.727 m)   Wt 182 lb 12.8 oz (82.9 kg)   BMI 27.79 kg/m   Filed Weights   08/15/20 1006  Weight: 182 lb 12.8 oz (82.9 kg)    Physical Exam HENT:     Head: Normocephalic and atraumatic.     Mouth/Throat:     Pharynx: No oropharyngeal exudate.  Eyes:     Pupils: Pupils are equal, round, and reactive to light.  Cardiovascular:     Rate and Rhythm: Normal rate and regular rhythm.  Pulmonary:     Effort: No respiratory distress.     Breath sounds: No wheezing.  Abdominal:     General: Bowel sounds are normal. There is no distension.     Palpations: Abdomen is soft. There is no mass.     Tenderness: There is no abdominal tenderness. There is no guarding or rebound.     Comments: Positive for mild splenomegaly.  Musculoskeletal:        General: No tenderness. Normal range of motion.     Cervical back: Normal range of motion and neck supple.  Skin:    General: Skin is warm.  Neurological:     Mental Status: He is alert and oriented to person, place, and time.  Psychiatric:        Mood and Affect: Affect normal.     LABORATORY DATA:  I have reviewed the data as listed    Component Value Date/Time   NA 141 08/15/2020 0953   NA 140 03/02/2014 0835   K 3.9 08/15/2020 0953   K 3.8 03/02/2014 0835   CL 108 08/15/2020 0953   CL 109 (H) 03/02/2014 0835   CO2 25 08/15/2020 0953   CO2 26  03/02/2014 0835   GLUCOSE 135 (H) 08/15/2020 0953   GLUCOSE 108 (H) 03/02/2014 0835   BUN 12 08/15/2020 0953   BUN 9 03/02/2014 0835   CREATININE 0.80 08/15/2020 0953   CREATININE 0.99 03/02/2014 0835   CALCIUM 8.6 (L) 08/15/2020 0953   CALCIUM 8.6 03/02/2014 0835   PROT 6.9 06/29/2020 1239   PROT 7.0 03/02/2014 0835   ALBUMIN 3.8 06/29/2020 1239   ALBUMIN 3.7 03/02/2014 0835   AST 18 06/29/2020 1239   AST 20 03/02/2014 0835   ALT 13 06/29/2020 1239   ALT 24 03/02/2014 0835  ALKPHOS 98 06/29/2020 1239   ALKPHOS 96 03/02/2014 0835   BILITOT 0.9 06/29/2020 1239   BILITOT 0.6 03/02/2014 0835   GFRNONAA >60 08/15/2020 0953   GFRNONAA >60 03/02/2014 0835   GFRAA >60 08/15/2020 0953   GFRAA >60 03/02/2014 0835    No results found for: SPEP, UPEP  Lab Results  Component Value Date   WBC 8.0 08/15/2020   NEUTROABS 6.6 08/15/2020   HGB 13.6 08/15/2020   HCT 44.4 08/15/2020   MCV 90.2 08/15/2020   PLT 437 (H) 08/15/2020      Chemistry      Component Value Date/Time   NA 141 08/15/2020 0953   NA 140 03/02/2014 0835   K 3.9 08/15/2020 0953   K 3.8 03/02/2014 0835   CL 108 08/15/2020 0953   CL 109 (H) 03/02/2014 0835   CO2 25 08/15/2020 0953   CO2 26 03/02/2014 0835   BUN 12 08/15/2020 0953   BUN 9 03/02/2014 0835   CREATININE 0.80 08/15/2020 0953   CREATININE 0.99 03/02/2014 0835      Component Value Date/Time   CALCIUM 8.6 (L) 08/15/2020 0953   CALCIUM 8.6 03/02/2014 0835   ALKPHOS 98 06/29/2020 1239   ALKPHOS 96 03/02/2014 0835   AST 18 06/29/2020 1239   AST 20 03/02/2014 0835   ALT 13 06/29/2020 1239   ALT 24 03/02/2014 0835   BILITOT 0.9 06/29/2020 1239   BILITOT 0.6 03/02/2014 0835       RADIOGRAPHIC STUDIES: I have personally reviewed the radiological images as listed and agreed with the findings in the report. No results found.   ASSESSMENT & PLAN:  Polycythemia vera ([place]) # Polycythemia vera Jak 2+; on Hydrea 500 mg twice a day; except Saturday  Sunday-extra pill. STABLE. June 2021-bone marrow biopsy persistent myeloproliferative disease; no evidence of transformation or leukemia. STABLE. [place]- July 2021- 1140-slightly enlarged/ over all STABLE.  # Today-hematocrit is 44; proceed with phlebotomy today.  #Low grade Fevers [99- 100.4 at most]-unclear etiology; Improved- not needing NSAIds as much; monitor for now.  # Hypocalcemia- ca- 8.6; recommend vit D 1000 BID.   # Shingles vaccine/Covid vaccine: S/p #1 covid vaccine shot-patient will need shingles vaccination after Covid shots are done.  #Disposition # Phlebotomy today; if possible # in 55month-H&H; possible phlebotomy. # in second week of dec- MD; cbc/cmp/LDH-possible phlebotomy-Dr.B   Orders Placed This Encounter  Procedures  . Hemoglobin ([facility]    Standing Status:   Future    Standing Expiration Date:   08/15/2021  . Hematocrit ([facility])    Standing Status:   Future    Standing Expiration Date:   08/15/2021  . CBC with Differential    Standing Status:   Future    Standing Expiration Date:   08/15/2021  . Comprehensive metabolic panel    Standing Status:   Future    Standing Expiration Date:   08/15/2021  . Lactate dehydrogenase    Standing Status:   Future    Standing Expiration Date:   08/15/2021   All questions were answered. The patient knows to call the clinic with any problems, questions or concerns.      GCammie Sickle MD 08/15/2020 2:45 PM

## 2020-08-15 NOTE — Assessment & Plan Note (Addendum)
#  Polycythemia vera Jak 2+; on Hydrea 500 mg twice a day; except Saturday Sunday-extra pill. STABLE. June 2021-bone marrow biopsy persistent myeloproliferative disease; no evidence of transformation or leukemia. STABLE. [place]- July 2021- 1140-slightly enlarged/ over all STABLE.  # Today-hematocrit is 44; proceed with phlebotomy today.  #Low grade Fevers [99- 100.4 at most]-unclear etiology; Improved- not needing NSAIds as much; monitor for now.  # Hypocalcemia- ca- 8.6; recommend vit D 1000 BID.   # Shingles vaccine/Covid vaccine: S/p #1 covid vaccine shot-patient will need shingles vaccination after Covid shots are done.  #Disposition # Phlebotomy today; if possible # in 69month-H&H; possible phlebotomy. # in second week of dec- MD; cbc/cmp/LDH-possible phlebotomy-Dr.B

## 2020-08-23 ENCOUNTER — Telehealth: Payer: Self-pay | Admitting: *Deleted

## 2020-08-23 NOTE — Telephone Encounter (Signed)
Patient had second COVID vaccine. Pharmacist suggested that he have antibodies drawn at his next lab draw to determine whether he would need a booster before the 6 month date.

## 2020-08-24 ENCOUNTER — Telehealth: Payer: Self-pay | Admitting: Internal Medicine

## 2020-08-24 NOTE — Telephone Encounter (Signed)
Dr. B- pls advise. 

## 2020-08-24 NOTE — Telephone Encounter (Signed)
Left voice mail with detailed message recommending against checking antibodies to decide on booster.  Recommend call us if any questions/or will discuss next visit.

## 2020-08-25 NOTE — Telephone Encounter (Signed)
See md phone note 

## 2020-10-10 ENCOUNTER — Inpatient Hospital Stay: Payer: No Typology Code available for payment source

## 2020-10-10 ENCOUNTER — Other Ambulatory Visit: Payer: Self-pay | Admitting: *Deleted

## 2020-10-10 ENCOUNTER — Other Ambulatory Visit: Payer: Self-pay

## 2020-10-10 ENCOUNTER — Inpatient Hospital Stay: Payer: No Typology Code available for payment source | Attending: Internal Medicine

## 2020-10-10 DIAGNOSIS — D751 Secondary polycythemia: Secondary | ICD-10-CM | POA: Insufficient documentation

## 2020-10-10 DIAGNOSIS — D45 Polycythemia vera: Secondary | ICD-10-CM

## 2020-10-10 LAB — HEMATOCRIT: HCT: 42.8 % (ref 39.0–52.0)

## 2020-10-10 LAB — HEMOGLOBIN: Hemoglobin: 13 g/dL (ref 13.0–17.0)

## 2020-10-10 NOTE — Progress Notes (Unsigned)
Labs reviewed with MD and treatment team. Per MD pt does not need phlebotomy today. Pt updated and new appointments given to patient. RN educated pt on the importance of notifying the clinic if any complications occur at home. All questions answered at this time.   Derek [name] [employer]

## 2020-10-31 ENCOUNTER — Inpatient Hospital Stay: Payer: No Typology Code available for payment source | Attending: Internal Medicine

## 2020-10-31 ENCOUNTER — Inpatient Hospital Stay: Payer: No Typology Code available for payment source

## 2020-10-31 ENCOUNTER — Other Ambulatory Visit: Payer: No Typology Code available for payment source

## 2020-10-31 VITALS — BP 127/72 | HR 80 | Temp 96.3°F | Resp 18

## 2020-10-31 DIAGNOSIS — D751 Secondary polycythemia: Secondary | ICD-10-CM | POA: Insufficient documentation

## 2020-10-31 DIAGNOSIS — D45 Polycythemia vera: Secondary | ICD-10-CM

## 2020-10-31 LAB — CBC WITH DIFFERENTIAL/PLATELET
Abs Immature Granulocytes: 0.32 10*3/uL — ABNORMAL HIGH (ref 0.00–0.07)
Basophils Absolute: 0.2 10*3/uL — ABNORMAL HIGH (ref 0.0–0.1)
Basophils Relative: 1 %
Eosinophils Absolute: 0.2 10*3/uL (ref 0.0–0.5)
Eosinophils Relative: 1 %
HCT: 45.7 % (ref 39.0–52.0)
Hemoglobin: 14.1 g/dL (ref 13.0–17.0)
Immature Granulocytes: 2 %
Lymphocytes Relative: 6 %
Lymphs Abs: 1.1 10*3/uL (ref 0.7–4.0)
MCH: 27.8 pg (ref 26.0–34.0)
MCHC: 30.9 g/dL (ref 30.0–36.0)
MCV: 90.1 fL (ref 80.0–100.0)
Monocytes Absolute: 0.4 10*3/uL (ref 0.1–1.0)
Monocytes Relative: 2 %
Neutro Abs: 16.3 10*3/uL — ABNORMAL HIGH (ref 1.7–7.7)
Neutrophils Relative %: 88 %
Platelets: 355 10*3/uL (ref 150–400)
RBC: 5.07 MIL/uL (ref 4.22–5.81)
RDW: 16.4 % — ABNORMAL HIGH (ref 11.5–15.5)
Smear Review: NORMAL
WBC: 18.6 10*3/uL — ABNORMAL HIGH (ref 4.0–10.5)
nRBC: 0 % (ref 0.0–0.2)

## 2020-10-31 NOTE — Progress Notes (Signed)
1356- Patient tolerated phlebotomy procedure well. Patient and vital signs stable. Patient discharged to home at this time.

## 2020-11-28 ENCOUNTER — Inpatient Hospital Stay: Payer: No Typology Code available for payment source

## 2020-11-28 ENCOUNTER — Inpatient Hospital Stay (HOSPITAL_BASED_OUTPATIENT_CLINIC_OR_DEPARTMENT_OTHER): Payer: No Typology Code available for payment source | Admitting: Internal Medicine

## 2020-11-28 ENCOUNTER — Encounter: Payer: Self-pay | Admitting: Internal Medicine

## 2020-11-28 ENCOUNTER — Inpatient Hospital Stay: Payer: No Typology Code available for payment source | Attending: Internal Medicine

## 2020-11-28 DIAGNOSIS — D45 Polycythemia vera: Secondary | ICD-10-CM | POA: Insufficient documentation

## 2020-11-28 DIAGNOSIS — Z79899 Other long term (current) drug therapy: Secondary | ICD-10-CM | POA: Insufficient documentation

## 2020-11-28 DIAGNOSIS — D751 Secondary polycythemia: Secondary | ICD-10-CM

## 2020-11-28 LAB — COMPREHENSIVE METABOLIC PANEL
ALT: 14 U/L (ref 0–44)
AST: 17 U/L (ref 15–41)
Albumin: 4.2 g/dL (ref 3.5–5.0)
Alkaline Phosphatase: 89 U/L (ref 38–126)
Anion gap: 9 (ref 5–15)
BUN: 12 mg/dL (ref 8–23)
CO2: 28 mmol/L (ref 22–32)
Calcium: 9.1 mg/dL (ref 8.9–10.3)
Chloride: 104 mmol/L (ref 98–111)
Creatinine, Ser: 0.89 mg/dL (ref 0.61–1.24)
GFR, Estimated: >60 mL/min (ref >60)
Glucose, Bld: 112 mg/dL — ABNORMAL HIGH (ref 70–99)
Potassium: 4.3 mmol/L (ref 3.5–5.1)
Sodium: 141 mmol/L (ref 135–145)
Total Bilirubin: 0.9 mg/dL (ref 0.3–1.2)
Total Protein: 6.9 g/dL (ref 6.5–8.1)

## 2020-11-28 LAB — CBC WITH DIFFERENTIAL/PLATELET
Abs Immature Granulocytes: 0.06 10*3/uL (ref 0.00–0.07)
Basophils Absolute: 0.1 10*3/uL (ref 0.0–0.1)
Basophils Relative: 1 %
Eosinophils Absolute: 0.2 10*3/uL (ref 0.0–0.5)
Eosinophils Relative: 3 %
HCT: 44.4 % (ref 39.0–52.0)
Hemoglobin: 13.3 g/dL (ref 13.0–17.0)
Immature Granulocytes: 1 %
Lymphocytes Relative: 10 %
Lymphs Abs: 1 10*3/uL (ref 0.7–4.0)
MCH: 27.3 pg (ref 26.0–34.0)
MCHC: 30 g/dL (ref 30.0–36.0)
MCV: 91.2 fL (ref 80.0–100.0)
Monocytes Absolute: 0.3 10*3/uL (ref 0.1–1.0)
Monocytes Relative: 4 %
Neutro Abs: 7.5 10*3/uL (ref 1.7–7.7)
Neutrophils Relative %: 81 %
Platelets: 290 10*3/uL (ref 150–400)
RBC: 4.87 MIL/uL (ref 4.22–5.81)
RDW: 15.8 % — ABNORMAL HIGH (ref 11.5–15.5)
WBC: 9.2 10*3/uL (ref 4.0–10.5)
nRBC: 0 % (ref 0.0–0.2)

## 2020-11-28 LAB — LACTATE DEHYDROGENASE: LDH: 152 U/L (ref 98–192)

## 2020-11-28 NOTE — Progress Notes (Signed)
Patient here for follow up no concerns today. 

## 2020-11-28 NOTE — Progress Notes (Signed)
[facility] OFFICE PROGRESS NOTE  Patient Care Team: Dion Body, MD as PCP - General (Family Medicine) Cammie Sickle, MD as Consulting Physician (Internal Medicine)  Cancer Staging No matching staging information was found for the patient.   Oncology History Overview Note  # 2014- POLYCYTHEMIA VERA - JAK-2 Positive; on Hydrea 569m/day; aspirin 81 mg a day; phlebotomy for Hct >43 [headaches];   # MAY 2019- BMBx- pan-hypercellular; no evidence of significant fibrosis; MAY [place] 1389 cc/ splenomegaly; Oct 11th 2019- Increase in splenomegaly; Increased Hydrea 15076mday.   # nonarteritic ischemic optic neuropathy-Left eye [may 2018; [facility] ---------------------------------------    DIAGNOSIS: _0  POLYCYTHEMIA VERA  ;GOALS: control  CURRENT/MOST RECENT THERAPY _1  Hydrea        Polycythemia vera (HCC)      INTERVAL HISTORY:  Derek Godshall89.o.  male pleasant patient above history of polycythemia vera on hydrea is here for follow-up.  In the interim patient noted to have resolution of his fevers.  He states his fevers resolved after he got his Covid shots.  Appetite is good.  No nausea vomiting pain abdominal pain.  No joint pains.  No weight loss.   Review of Systems  Constitutional: Negative for chills, diaphoresis and weight loss.  HENT: Negative for nosebleeds and sore throat.   Eyes: Negative for double vision.  Respiratory: Negative for cough, hemoptysis, sputum production, shortness of breath and wheezing.   Cardiovascular: Negative for chest pain, palpitations, orthopnea and leg swelling.  Gastrointestinal: Negative for blood in stool, constipation, diarrhea, heartburn, melena, nausea and vomiting.  Genitourinary: Negative for dysuria, frequency and urgency.  Musculoskeletal: Negative for back pain and joint pain.  Skin: Negative.  Negative for itching and rash.  Neurological: Negative  for dizziness, tingling, focal weakness, weakness and headaches.  Endo/Heme/Allergies: Does not bruise/bleed easily.  Psychiatric/Behavioral: Negative for depression. The patient is not nervous/anxious and does not have insomnia.       PAST MEDICAL HISTORY :  Past Medical History:  Diagnosis Date  . Arthritis   . GERD (gastroesophageal reflux disease)   . Gout   . Low serum testosterone level   . Polycythemia vera [place]  . Polycythemia, secondary 06/08/2015  . Pulmonary nodules   . Thoracic spine fracture (HCC)    compression fracture following a fall    PAST SURGICAL HISTORY :   Past Surgical History:  Procedure Laterality Date  . COLONOSCOPY  2013    FAMILY HISTORY :   Family History  Problem Relation Age of Onset  . Throat cancer Other        uncle  . Migraines Neg Hx   . Multiple sclerosis Neg Hx   . Neurofibromatosis Neg Hx   . Parkinsonism Neg Hx   . Seizures Neg Hx   . Neuropathy Neg Hx     SOCIAL HISTORY:   Social History   Tobacco Use  . Smoking status: Never Smoker  . Smokeless tobacco: Never Used  Substance Use Topics  . Alcohol use: Yes    Alcohol/week: 0.0 standard drinks    Comment: occasional alcohol use  . Drug use: No    ALLERGIES:  is allergic to penicillin g.  MEDICATIONS:  Current Outpatient Medications  Medication Sig Dispense Refill  . acetaminophen (TYLENOL) 500 MG tablet Take 500 mg by mouth every 6 (six) hours as needed for fever.    . Marland KitchenmLODipine (NORVASC) 5 MG tablet Take 5 mg daily by  mouth.  11  . aspirin EC 81 MG tablet Take 81 mg daily by mouth.     . Cholecalciferol 25 MCG (1000 UT) tablet Take 1,000 Units daily by mouth.     . Esomeprazole Magnesium (NEXIUM PO) Take 2 capsules by mouth daily.     . hydroxyurea (HYDREA) 500 MG capsule May take with food to minimize GI side effects. 810 capsule 1  . meloxicam (MOBIC) 15 MG tablet Take 1 tablet by mouth daily with breakfast.    . Multiple Vitamins-Minerals (MULTIVITAMIN  ADULT PO) Take 1 tablet by mouth 1 day or 1 dose.    . ondansetron (ZOFRAN ODT) 4 MG disintegrating tablet Take 1 tablet (4 mg total) by mouth every 8 (eight) hours as needed for nausea or vomiting. 20 tablet 0   No current facility-administered medications for this visit.    PHYSICAL EXAMINATION: ECOG PERFORMANCE STATUS: 0 - Asymptomatic  BP 135/87 (BP Location: Left Arm, Patient Position: Sitting)   Pulse 92   Temp 98 F (36.7 C) (Tympanic)   Wt 190 lb 3.2 oz (86.3 kg)   SpO2 99%   BMI 28.92 kg/m   Filed Weights   11/28/20 1317  Weight: 190 lb 3.2 oz (86.3 kg)    Physical Exam HENT:     Head: Normocephalic and atraumatic.     Mouth/Throat:     Pharynx: No oropharyngeal exudate.  Eyes:     Pupils: Pupils are equal, round, and reactive to light.  Cardiovascular:     Rate and Rhythm: Normal rate and regular rhythm.  Pulmonary:     Effort: No respiratory distress.     Breath sounds: No wheezing.  Abdominal:     General: Bowel sounds are normal. There is no distension.     Palpations: Abdomen is soft. There is no mass.     Tenderness: There is no abdominal tenderness. There is no guarding or rebound.     Comments: Positive for mild splenomegaly.  Musculoskeletal:        General: No tenderness. Normal range of motion.     Cervical back: Normal range of motion and neck supple.  Skin:    General: Skin is warm.  Neurological:     Mental Status: He is alert and oriented to person, place, and time.  Psychiatric:        Mood and Affect: Affect normal.     LABORATORY DATA:  I have reviewed the data as listed    Component Value Date/Time   NA 141 11/28/2020 1258   NA 140 03/02/2014 0835   K 4.3 11/28/2020 1258   K 3.8 03/02/2014 0835   CL 104 11/28/2020 1258   CL 109 (H) 03/02/2014 0835   CO2 28 11/28/2020 1258   CO2 26 03/02/2014 0835   GLUCOSE 112 (H) 11/28/2020 1258   GLUCOSE 108 (H) 03/02/2014 0835   BUN 12 11/28/2020 1258   BUN 9 03/02/2014 0835    CREATININE 0.89 11/28/2020 1258   CREATININE 0.99 03/02/2014 0835   CALCIUM 9.1 11/28/2020 1258   CALCIUM 8.6 03/02/2014 0835   PROT 6.9 11/28/2020 1258   PROT 7.0 03/02/2014 0835   ALBUMIN 4.2 11/28/2020 1258   ALBUMIN 3.7 03/02/2014 0835   AST 17 11/28/2020 1258   AST 20 03/02/2014 0835   ALT 14 11/28/2020 1258   ALT 24 03/02/2014 0835   ALKPHOS 89 11/28/2020 1258   ALKPHOS 96 03/02/2014 0835   BILITOT 0.9 11/28/2020 1258   BILITOT 0.6 03/02/2014  [place] >60 11/28/2020 1258   GFRNONAA >60 03/02/2014 0835   GFRAA >60 08/15/2020 0953   GFRAA >60 03/02/2014 0835    No results found for: SPEP, UPEP  Lab Results  Component Value Date   WBC 9.2 11/28/2020   NEUTROABS 7.5 11/28/2020   HGB 13.3 11/28/2020   HCT 44.4 11/28/2020   MCV 91.2 11/28/2020   PLT 290 11/28/2020      Chemistry      Component Value Date/Time   NA 141 11/28/2020 1258   NA 140 03/02/2014 0835   K 4.3 11/28/2020 1258   K 3.8 03/02/2014 0835   CL 104 11/28/2020 1258   CL 109 (H) 03/02/2014 0835   CO2 28 11/28/2020 1258   CO2 26 03/02/2014 0835   BUN 12 11/28/2020 1258   BUN 9 03/02/2014 0835   CREATININE 0.89 11/28/2020 1258   CREATININE 0.99 03/02/2014 0835      Component Value Date/Time   CALCIUM 9.1 11/28/2020 1258   CALCIUM 8.6 03/02/2014 0835   ALKPHOS 89 11/28/2020 1258   ALKPHOS 96 03/02/2014 0835   AST 17 11/28/2020 1258   AST 20 03/02/2014 0835   ALT 14 11/28/2020 1258   ALT 24 03/02/2014 0835   BILITOT 0.9 11/28/2020 1258   BILITOT 0.6 03/02/2014 0835       RADIOGRAPHIC STUDIES: I have personally reviewed the radiological images as listed and agreed with the findings in the report. No results found.   ASSESSMENT & PLAN:  Polycythemia vera ([facility] [place]) # Polycythemia vera Jak 2+; on Hydrea 500 mg twice a day; except Saturday Sunday-extra pill. STABLE. June 2021-bone marrow biopsy persistent myeloproliferative disease; no evidence of transformation or leukemia. STABLE. [place]-  July 2021- 1140-slightly enlarged/ over all STABLE.  No concerns for any transformation.  We will repeat ultrasound in 4 months  # Today-hematocrit is 44; proceed with phlebotomy today.  #Low grade Fevers [99- 100.4 at most]-resolved ? Etiology. Monitor closely.   # Hypocalcemia- improved today Ca-9.2; cpontinue vit D 1000 BID.   # Shingles vaccine/Covid vaccine: S/p Covid vaccination.  Reminded the patient of shingles vaccination [given need for Jakafu in the future].  #Disposition # Phlebotomy today; # in 8month-H&H; possible phlebotomy. # in 4 months- MD; cbc/cmp/LDH-possible phlebotomy; [facility]   No orders of the defined types were placed in this encounter.  All questions were answered. The patient knows to call the clinic with any problems, questions or concerns.      GCammie Sickle MD 11/28/2020 1:49 PM

## 2020-11-28 NOTE — Progress Notes (Signed)
Pt tolerated 350 ml phlebotomy well today. VSS. Ambulatory at discharge. Instruct no heavy lifting. Rest and Hydrate with plenty of fluids rest of day.

## 2020-11-28 NOTE — Assessment & Plan Note (Addendum)
#  Polycythemia vera Jak 2+; on Hydrea 500 mg twice a day; except Saturday Sunday-extra pill. STABLE. June 2021-bone marrow biopsy persistent myeloproliferative disease; no evidence of transformation or leukemia. STABLE. [place]- July 2021- 1140-slightly enlarged/ over all STABLE.  No concerns for any transformation.  We will repeat ultrasound in 4 months  # Today-hematocrit is 44; proceed with phlebotomy today.  #Low grade Fevers [99- 100.4 at most]-resolved ? Etiology. Monitor closely.   # Hypocalcemia- improved today Ca-9.2; cpontinue vit D 1000 BID.   # Shingles vaccine/Covid vaccine: S/p Covid vaccination.  Reminded the patient of shingles vaccination [given need for Jakafu in the future].  #Disposition # Phlebotomy today; # in 7month-H&H; possible phlebotomy. # in 4 months- MD; cbc/cmp/LDH-possible phlebotomy; [facility]

## 2020-12-10 ENCOUNTER — Emergency Department: Payer: No Typology Code available for payment source

## 2020-12-10 ENCOUNTER — Other Ambulatory Visit: Payer: Self-pay

## 2020-12-10 ENCOUNTER — Emergency Department
Admission: EM | Admit: 2020-12-10 | Discharge: 2020-12-10 | Disposition: A | Discharge status: Home / Self Care | Payer: No Typology Code available for payment source | Attending: Emergency Medicine | Admitting: Emergency Medicine

## 2020-12-10 DIAGNOSIS — R918 Other nonspecific abnormal finding of lung field: Secondary | ICD-10-CM

## 2020-12-10 DIAGNOSIS — S32040A Wedge compression fracture of fourth lumbar vertebra, initial encounter for closed fracture: Secondary | ICD-10-CM | POA: Insufficient documentation

## 2020-12-10 DIAGNOSIS — S3992XA Unspecified injury of lower back, initial encounter: Secondary | ICD-10-CM | POA: Diagnosis present

## 2020-12-10 DIAGNOSIS — Y92410 Unspecified street and highway as the place of occurrence of the external cause: Secondary | ICD-10-CM | POA: Diagnosis not present

## 2020-12-10 DIAGNOSIS — I10 Essential (primary) hypertension: Secondary | ICD-10-CM | POA: Insufficient documentation

## 2020-12-10 DIAGNOSIS — T1490XA Injury, unspecified, initial encounter: Secondary | ICD-10-CM

## 2020-12-10 LAB — CBC
HCT: 41.7 % (ref 39.0–52.0)
Hemoglobin: 12.6 g/dL — ABNORMAL LOW (ref 13.0–17.0)
MCH: 27.5 pg (ref 26.0–34.0)
MCHC: 30.2 g/dL (ref 30.0–36.0)
MCV: 90.8 fL (ref 80.0–100.0)
Platelets: 404 10*3/uL — ABNORMAL HIGH (ref 150–400)
RBC: 4.59 MIL/uL (ref 4.22–5.81)
RDW: 14.6 % (ref 11.5–15.5)
WBC: 11.1 10*3/uL — ABNORMAL HIGH (ref 4.0–10.5)
nRBC: 0 % (ref 0.0–0.2)

## 2020-12-10 LAB — BASIC METABOLIC PANEL
Anion gap: 10 (ref 5–15)
BUN: 10 mg/dL (ref 8–23)
CO2: 26 mmol/L (ref 22–32)
Calcium: 8.9 mg/dL (ref 8.9–10.3)
Chloride: 105 mmol/L (ref 98–111)
Creatinine, Ser: 0.87 mg/dL (ref 0.61–1.24)
GFR, Estimated: >60 mL/min (ref >60)
Glucose, Bld: 150 mg/dL — ABNORMAL HIGH (ref 70–99)
Potassium: 3.9 mmol/L (ref 3.5–5.1)
Sodium: 141 mmol/L (ref 135–145)

## 2020-12-10 LAB — URINALYSIS, COMPLETE (UACMP) WITH MICROSCOPIC
Bacteria, UA: NONE SEEN
Bilirubin Urine: NEGATIVE
Glucose, UA: NEGATIVE mg/dL
Hgb urine dipstick: NEGATIVE
Ketones, ur: NEGATIVE mg/dL
Leukocytes,Ua: NEGATIVE
Nitrite: NEGATIVE
Protein, ur: NEGATIVE mg/dL
Specific Gravity, Urine: 1.013 (ref 1.005–1.030)
Squamous Epithelial / HPF: NONE SEEN (ref 0–5)
pH: 7 (ref 5.0–8.0)

## 2020-12-10 MED ORDER — OXYCODONE-ACETAMINOPHEN 5-325 MG PO TABS
1.0000 | ORAL_TABLET | Freq: Once | ORAL | Status: AC
  Administered 2020-12-10: 23:00:00 1 via ORAL
  Filled 2020-12-10: qty 1

## 2020-12-10 MED ORDER — OXYCODONE-ACETAMINOPHEN 5-325 MG PO TABS: 1.0000 | ORAL_TABLET | Freq: Four times a day (QID) | ORAL | 0 refills | Status: DC | PRN

## 2020-12-10 MED ORDER — HYDROXYUREA 500 MG PO CAPS
1000.0000 mg | ORAL_CAPSULE | Freq: Once | ORAL | Status: DC
  Filled 2020-12-10: qty 2

## 2020-12-10 MED ORDER — IOHEXOL 300 MG/ML  SOLN
100.0000 mL | Freq: Once | INTRAMUSCULAR | Status: AC | PRN
  Administered 2020-12-10: 17:00:00 100 mL via INTRAVENOUS
  Filled 2020-12-10: qty 100

## 2020-12-10 MED ORDER — ACETAMINOPHEN 325 MG PO TABS
650.0000 mg | ORAL_TABLET | Freq: Once | ORAL | Status: AC
  Administered 2020-12-10: 18:00:00 650 mg via ORAL
  Filled 2020-12-10: qty 2

## 2020-12-10 MED ORDER — LIDOCAINE 5 % EX PTCH
1.0000 | MEDICATED_PATCH | CUTANEOUS | Status: DC
  Administered 2020-12-10: 18:00:00 1 via TRANSDERMAL
  Filled 2020-12-10 (×2): qty 1

## 2020-12-10 MED ORDER — OXYCODONE-ACETAMINOPHEN 5-325 MG PO TABS: 1.0000 | ORAL_TABLET | Freq: Four times a day (QID) | ORAL | 0 refills | Status: AC | PRN

## 2020-12-10 MED ORDER — HYDROXYUREA 500 MG PO CAPS: 500.0000 mg | ORAL_CAPSULE | Freq: Every day | ORAL | Status: DC

## 2020-12-10 MED ORDER — ONDANSETRON 4 MG PO TBDP
4.0000 mg | ORAL_TABLET | Freq: Once | ORAL | Status: AC
  Administered 2020-12-10: 23:00:00 4 mg via ORAL
  Filled 2020-12-10: qty 1
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
## 2020-12-10 NOTE — ED Provider Notes (Signed)
  Physical Exam  BP 130/86   Pulse 100   Temp 98.7 F (37.1 C) (Oral)   Resp 18   SpO2 100%   Physical Exam  ED Course/Procedures     Procedures  MDM   Assumed care for Derek Emperor, PA-C.  MRI of the lumbar spine indicated that previously identified lumbar spine lucencies were compatible with benign hemangiomas.  I contacted Dr. Tasia Catchings to notify her of results and she recommended follow-up with patient's oncologist as normal.  Patient's LSO brace was applied in the emergency department and post application films were obtained.  Patient's hydroxyurea was given in the emergency department prior to discharge and all patient questions were answered.      Vallarie Mare [place], Hershal Coria 12/10/20 2336    Blake Divine, MD 12/10/20 2337
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
## 2020-12-10 NOTE — ED Notes (Signed)
Pt taken by CT 

## 2020-12-10 NOTE — ED Notes (Signed)
Pt in MRI.
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
## 2020-12-10 NOTE — ED Notes (Signed)
Called pharmacy about medication  

## 2020-12-20 DIAGNOSIS — I10 Essential (primary) hypertension: Secondary | ICD-10-CM | POA: Diagnosis not present

## 2020-12-20 DIAGNOSIS — E781 Pure hyperglyceridemia: Secondary | ICD-10-CM | POA: Diagnosis not present

## 2020-12-20 DIAGNOSIS — R0789 Other chest pain: Secondary | ICD-10-CM | POA: Diagnosis not present

## 2020-12-26 DIAGNOSIS — E781 Pure hyperglyceridemia: Secondary | ICD-10-CM | POA: Diagnosis not present

## 2020-12-26 DIAGNOSIS — Z136 Encounter for screening for cardiovascular disorders: Secondary | ICD-10-CM | POA: Diagnosis not present

## 2020-12-26 DIAGNOSIS — K219 Gastro-esophageal reflux disease without esophagitis: Secondary | ICD-10-CM | POA: Diagnosis not present

## 2020-12-26 DIAGNOSIS — S32040D Wedge compression fracture of fourth lumbar vertebra, subsequent encounter for fracture with routine healing: Secondary | ICD-10-CM | POA: Diagnosis not present

## 2020-12-26 DIAGNOSIS — I10 Essential (primary) hypertension: Secondary | ICD-10-CM | POA: Diagnosis not present

## 2021-01-10 ENCOUNTER — Telehealth: Payer: Self-pay | Admitting: *Deleted

## 2021-01-10 NOTE — Telephone Encounter (Signed)
Derek [name] has already r/s the patient for 2/8

## 2021-01-10 NOTE — Telephone Encounter (Signed)
Patient would like his appointment for labs and possible phlebotomy sooner than scheduled but not on 2/3 or 2/7.

## 2021-01-13 DIAGNOSIS — M25551 Pain in right hip: Secondary | ICD-10-CM | POA: Diagnosis not present

## 2021-01-13 DIAGNOSIS — S32040D Wedge compression fracture of fourth lumbar vertebra, subsequent encounter for fracture with routine healing: Secondary | ICD-10-CM | POA: Diagnosis not present

## 2021-01-13 DIAGNOSIS — Z8781 Personal history of (healed) traumatic fracture: Secondary | ICD-10-CM | POA: Diagnosis not present

## 2021-01-13 DIAGNOSIS — M25552 Pain in left hip: Secondary | ICD-10-CM | POA: Diagnosis not present

## 2021-01-24 ENCOUNTER — Inpatient Hospital Stay: Payer: PPO | Attending: Internal Medicine

## 2021-01-24 ENCOUNTER — Inpatient Hospital Stay: Payer: PPO

## 2021-01-24 ENCOUNTER — Other Ambulatory Visit: Payer: Self-pay

## 2021-01-24 ENCOUNTER — Other Ambulatory Visit: Payer: Self-pay | Admitting: *Deleted

## 2021-01-24 DIAGNOSIS — D751 Secondary polycythemia: Secondary | ICD-10-CM

## 2021-01-24 DIAGNOSIS — D45 Polycythemia vera: Secondary | ICD-10-CM | POA: Diagnosis not present

## 2021-01-24 LAB — HEMATOCRIT: HCT: 42.1 % (ref 39.0–52.0)

## 2021-01-24 LAB — HEMOGLOBIN: Hemoglobin: 13.1 g/dL (ref 13.0–17.0)

## 2021-01-24 NOTE — Progress Notes (Signed)
Per parameters pt's HCT is 42.1. No phlebotomy required today. Pt was very pleasant. He will keep all follow up appointments.

## 2021-01-24 NOTE — Progress Notes (Signed)
Pt denied being symptomatic and feeling need for phlebotomy today. He did say he would like another lab/ phlebotomy appt in 4 weeks. He doesn't want to wait another 8 weeks. I scheduled an appt 4 weeks from today.

## 2021-01-24 NOTE — Addendum Note (Signed)
Addended by: Sofie Rower A on: 01/24/2021 10:52 AM   Modules accepted: Orders

## 2021-01-30 ENCOUNTER — Other Ambulatory Visit: Payer: No Typology Code available for payment source

## 2021-02-10 DIAGNOSIS — S32040D Wedge compression fracture of fourth lumbar vertebra, subsequent encounter for fracture with routine healing: Secondary | ICD-10-CM | POA: Diagnosis not present

## 2021-02-21 ENCOUNTER — Inpatient Hospital Stay: Payer: PPO | Attending: Internal Medicine

## 2021-02-21 ENCOUNTER — Inpatient Hospital Stay: Payer: PPO

## 2021-02-21 VITALS — BP 125/89 | HR 106 | Temp 98.0°F | Resp 18

## 2021-02-21 DIAGNOSIS — D45 Polycythemia vera: Secondary | ICD-10-CM | POA: Diagnosis not present

## 2021-02-21 DIAGNOSIS — D751 Secondary polycythemia: Secondary | ICD-10-CM

## 2021-02-21 LAB — HEMATOCRIT: HCT: 49.8 % (ref 39.0–52.0)

## 2021-02-21 LAB — HEMOGLOBIN: Hemoglobin: 14.9 g/dL (ref 13.0–17.0)

## 2021-02-21 NOTE — Progress Notes (Signed)
Pt came in for phlebotomy today. He denies pain but states he got up and ate too much junk in the middle of the night and his stomach feels queasy. I asked if he wanted to come back another day for phlebotomy, he declined and said he would be fine to have it. 5 minutes after completing phlebotomy pt had a vomiting episode. Now he feels much better. Drinking sprite. Denies any dizziness. States he is ready to go home as he feels fine to be discharged from clinic. VSS.

## 2021-02-24 DIAGNOSIS — R112 Nausea with vomiting, unspecified: Secondary | ICD-10-CM | POA: Diagnosis not present

## 2021-02-24 DIAGNOSIS — K529 Noninfective gastroenteritis and colitis, unspecified: Secondary | ICD-10-CM | POA: Diagnosis not present

## 2021-03-03 ENCOUNTER — Inpatient Hospital Stay: Payer: PPO

## 2021-03-03 ENCOUNTER — Telehealth: Payer: Self-pay | Admitting: *Deleted

## 2021-03-03 ENCOUNTER — Other Ambulatory Visit: Payer: Self-pay

## 2021-03-03 ENCOUNTER — Other Ambulatory Visit: Payer: Self-pay | Admitting: *Deleted

## 2021-03-03 DIAGNOSIS — D45 Polycythemia vera: Secondary | ICD-10-CM | POA: Diagnosis not present

## 2021-03-03 LAB — CBC WITH DIFFERENTIAL/PLATELET
Abs Immature Granulocytes: 0.03 10*3/uL (ref 0.00–0.07)
Basophils Absolute: 0.1 10*3/uL (ref 0.0–0.1)
Basophils Relative: 1 %
Eosinophils Absolute: 0.1 10*3/uL (ref 0.0–0.5)
Eosinophils Relative: 2 %
HCT: 38.1 % — ABNORMAL LOW (ref 39.0–52.0)
Hemoglobin: 11.8 g/dL — ABNORMAL LOW (ref 13.0–17.0)
Immature Granulocytes: 1 %
Lymphocytes Relative: 14 %
Lymphs Abs: 0.9 10*3/uL (ref 0.7–4.0)
MCH: 27.6 pg (ref 26.0–34.0)
MCHC: 31 g/dL (ref 30.0–36.0)
MCV: 89.2 fL (ref 80.0–100.0)
Monocytes Absolute: 0.2 10*3/uL (ref 0.1–1.0)
Monocytes Relative: 4 %
Neutro Abs: 5 10*3/uL (ref 1.7–7.7)
Neutrophils Relative %: 78 %
Platelets: 212 10*3/uL (ref 150–400)
RBC: 4.27 MIL/uL (ref 4.22–5.81)
RDW: 16.3 % — ABNORMAL HIGH (ref 11.5–15.5)
Smear Review: NORMAL
WBC: 6.3 10*3/uL (ref 4.0–10.5)
nRBC: 0 % (ref 0.0–0.2)

## 2021-03-03 NOTE — Telephone Encounter (Signed)
Patient called answering service reporting that he thinks his HCT is high because he has a severe headache. Please advise

## 2021-03-03 NOTE — Telephone Encounter (Signed)
Lab apt made at 345pm. This was the soonest the patient could come.

## 2021-03-03 NOTE — Telephone Encounter (Signed)
Hct - 38.1  Patient did not require a phlebotomy. Pt d/c and instructed to drink plenty of fluids. Bp check by RN 140/60.

## 2021-03-03 NOTE — Telephone Encounter (Signed)
Last phlebotomy performed on 3/8. Patient states that he feels symptomatic and believes his hgb is still too high. He would like to be considered for a phlebotomy. He does not get off of work until 330 today. Lab apt made for 345 pm today.  Perimeters: Proceed with phlebotmy if hemocrit is greater than 43; or if pt is symptomatic with headaches.

## 2021-03-03 NOTE — Telephone Encounter (Signed)
Sounds good. Is he coming in?

## 2021-03-07 ENCOUNTER — Other Ambulatory Visit: Payer: Self-pay | Admitting: *Deleted

## 2021-03-07 DIAGNOSIS — D45 Polycythemia vera: Secondary | ICD-10-CM

## 2021-03-07 MED ORDER — HYDROXYUREA 500 MG PO CAPS: ORAL_CAPSULE | ORAL | 3 refills | Status: DC

## 2021-03-10 DIAGNOSIS — S32040D Wedge compression fracture of fourth lumbar vertebra, subsequent encounter for fracture with routine healing: Secondary | ICD-10-CM | POA: Diagnosis not present

## 2021-03-23 ENCOUNTER — Ambulatory Visit
Admission: RE | Admit: 2021-03-23 | Discharge: 2021-03-23 | Disposition: A | Discharge status: Home / Self Care | Payer: PPO | Source: Ambulatory Visit | Attending: Internal Medicine | Admitting: Internal Medicine

## 2021-03-23 ENCOUNTER — Other Ambulatory Visit: Payer: Self-pay

## 2021-03-23 DIAGNOSIS — D45 Polycythemia vera: Secondary | ICD-10-CM | POA: Insufficient documentation

## 2021-03-23 DIAGNOSIS — R161 Splenomegaly, not elsewhere classified: Secondary | ICD-10-CM | POA: Diagnosis not present

## 2021-03-24 DIAGNOSIS — B354 Tinea corporis: Secondary | ICD-10-CM | POA: Diagnosis not present

## 2021-03-27 ENCOUNTER — Encounter: Payer: Self-pay | Admitting: Internal Medicine

## 2021-03-27 ENCOUNTER — Inpatient Hospital Stay: Payer: PPO | Attending: Internal Medicine

## 2021-03-27 ENCOUNTER — Inpatient Hospital Stay (HOSPITAL_BASED_OUTPATIENT_CLINIC_OR_DEPARTMENT_OTHER): Payer: PPO | Admitting: Internal Medicine

## 2021-03-27 ENCOUNTER — Inpatient Hospital Stay: Payer: PPO

## 2021-03-27 DIAGNOSIS — D45 Polycythemia vera: Secondary | ICD-10-CM | POA: Insufficient documentation

## 2021-03-27 DIAGNOSIS — D751 Secondary polycythemia: Secondary | ICD-10-CM

## 2021-03-27 LAB — CBC WITH DIFFERENTIAL/PLATELET
Abs Immature Granulocytes: 0.08 10*3/uL — ABNORMAL HIGH (ref 0.00–0.07)
Basophils Absolute: 0.1 10*3/uL (ref 0.0–0.1)
Basophils Relative: 1 %
Eosinophils Absolute: 0.1 10*3/uL (ref 0.0–0.5)
Eosinophils Relative: 1 %
HCT: 41.3 % (ref 39.0–52.0)
Hemoglobin: 12.4 g/dL — ABNORMAL LOW (ref 13.0–17.0)
Immature Granulocytes: 1 %
Lymphocytes Relative: 9 %
Lymphs Abs: 0.8 10*3/uL (ref 0.7–4.0)
MCH: 27.5 pg (ref 26.0–34.0)
MCHC: 30 g/dL (ref 30.0–36.0)
MCV: 91.6 fL (ref 80.0–100.0)
Monocytes Absolute: 0.3 10*3/uL (ref 0.1–1.0)
Monocytes Relative: 3 %
Neutro Abs: 8.2 10*3/uL — ABNORMAL HIGH (ref 1.7–7.7)
Neutrophils Relative %: 85 %
Platelets: 251 10*3/uL (ref 150–400)
RBC: 4.51 MIL/uL (ref 4.22–5.81)
RDW: 14.7 % (ref 11.5–15.5)
Smear Review: NORMAL
WBC: 9.6 10*3/uL (ref 4.0–10.5)
nRBC: 0 % (ref 0.0–0.2)

## 2021-03-27 LAB — COMPREHENSIVE METABOLIC PANEL
ALT: 18 U/L (ref 0–44)
AST: 22 U/L (ref 15–41)
Albumin: 4.2 g/dL (ref 3.5–5.0)
Alkaline Phosphatase: 89 U/L (ref 38–126)
Anion gap: 9 (ref 5–15)
BUN: 10 mg/dL (ref 8–23)
CO2: 26 mmol/L (ref 22–32)
Calcium: 8.8 mg/dL — ABNORMAL LOW (ref 8.9–10.3)
Chloride: 106 mmol/L (ref 98–111)
Creatinine, Ser: 0.83 mg/dL (ref 0.61–1.24)
GFR, Estimated: >60 mL/min (ref >60)
Glucose, Bld: 128 mg/dL — ABNORMAL HIGH (ref 70–99)
Potassium: 3.8 mmol/L (ref 3.5–5.1)
Sodium: 141 mmol/L (ref 135–145)
Total Bilirubin: 0.8 mg/dL (ref 0.3–1.2)
Total Protein: 6.7 g/dL (ref 6.5–8.1)

## 2021-03-27 LAB — LACTATE DEHYDROGENASE: LDH: 170 U/L (ref 98–192)

## 2021-03-27 NOTE — Progress Notes (Signed)
Patient states know new concerns at the moment.

## 2021-03-27 NOTE — Assessment & Plan Note (Addendum)
#  Polycythemia vera Jak 2+; on Hydrea 500 mg twice a day; except Saturday Sunday-extra pill. STABLE:  June 2021-bone marrow biopsy persistent myeloproliferative disease; no evidence of transformation or leukemia. STABLE. US- April 2022- STABLE; 1143 cc/ severe splenomegaly but over all STABLE.  No concerns for any transformation.    # Today-hematocrit is 41 ; proceed with phlebotomy today.  #Low grade Fevers [99- 100.4 at most]-resolved ? Etiology. Monitor closely.   # Shingles vaccine/Covid vaccine: S/p Covid vaccination.  Reminded the patient of shingles vaccination [given need for Jakafu in the future].  #Disposition # NO Phlebotomy today; # in 2months-H&H; possible phlebotomy. # in 4 months- MD; cbc/cmp/LDH-possible phlebotomy;-Dr.B 

## 2021-03-27 NOTE — Progress Notes (Signed)
[place] OFFICE PROGRESS NOTE  Patient Care Team: Dion Body, MD as PCP - General (Family Medicine) Cammie Sickle, MD as Consulting Physician (Internal Medicine)  Cancer Staging No matching staging information was found for the patient.   Oncology History Overview Note  # 2014- POLYCYTHEMIA VERA - JAK-2 Positive; on Hydrea 558m/day; aspirin 81 mg a day; phlebotomy for Hct >43 [headaches];   # MAY 2019- BMBx- pan-hypercellular; no evidence of significant fibrosis; [place] 1389 cc/ splenomegaly; Oct 11th 2019- Increase in splenomegaly; Increased Hydrea 15023mday.   # nonarteritic ischemic optic neuropathy-Left eye [facility]may 2018; [facility] ---------------------------------------    DIAGNOSIS: _0  POLYCYTHEMIA VERA  ;GOALS: control  CURRENT/MOST RECENT THERAPY _1  Hydrea        Polycythemia vera (HCC)      INTERVAL HISTORY:  Derek Ramthun89.o.  male pleasant patient above history of polycythemia vera on hydrea is here for follow-up/review results of ultrasound  Patient denies any nausea vomiting abdominal pain.  Denies any weight loss.  Denies any night sweats.  Denies any fatigue.  Denies any headaches.  Patient had phlebotomy approximately 2 months ago.  Denies any fevers.  Review of Systems  Constitutional: Negative for chills, diaphoresis and weight loss.  HENT: Negative for nosebleeds and sore throat.   Eyes: Negative for double vision.  Respiratory: Negative for cough, hemoptysis, sputum production, shortness of breath and wheezing.   Cardiovascular: Negative for chest pain, palpitations, orthopnea and leg swelling.  Gastrointestinal: Negative for blood in stool, constipation, diarrhea, heartburn, melena, nausea and vomiting.  Genitourinary: Negative for dysuria, frequency and urgency.  Musculoskeletal: Negative for back pain and joint pain.  Skin: Negative.  Negative for itching and rash.   Neurological: Negative for dizziness, tingling, focal weakness, weakness and headaches.  Endo/Heme/Allergies: Does not bruise/bleed easily.  Psychiatric/Behavioral: Negative for depression. The patient is not nervous/anxious and does not have insomnia.       PAST MEDICAL HISTORY :  Past Medical History:  Diagnosis Date  . Arthritis   . GERD (gastroesophageal reflux disease)   . Gout   . Low serum testosterone level   . [place]  . Polycythemia, secondary 06/08/2015  . Pulmonary nodules   . Thoracic spine fracture (HCC)    compression fracture following a fall    PAST SURGICAL HISTORY :   Past Surgical History:  Procedure Laterality Date  . COLONOSCOPY  2013    FAMILY HISTORY :   Family History  Problem Relation Age of Onset  . Throat cancer Other        uncle  . Migraines Neg Hx   . Multiple sclerosis Neg Hx   . Neurofibromatosis Neg Hx   . Parkinsonism Neg Hx   . Seizures Neg Hx   . Neuropathy Neg Hx     SOCIAL HISTORY:   Social History   Tobacco Use  . Smoking status: Never Smoker  . Smokeless tobacco: Never Used  Substance Use Topics  . Alcohol use: Yes    Alcohol/week: 0.0 standard drinks    Comment: occasional alcohol use  . Drug use: No    ALLERGIES:  is allergic to penicillin g.  MEDICATIONS:  Current Outpatient Medications  Medication Sig Dispense Refill  . acetaminophen (TYLENOL) 500 MG tablet Take 500 mg by mouth every 6 (six) hours as needed for fever.    . Marland KitchenmLODipine (NORVASC) 5 MG tablet Take 5 mg daily by mouth.  11  .  aspirin EC 81 MG tablet Take 81 mg daily by mouth.     . Cholecalciferol 25 MCG (1000 UT) tablet Take 1,000 Units daily by mouth.     . Esomeprazole Magnesium (NEXIUM PO) Take 2 capsules by mouth daily.     . hydroxyurea (HYDREA) 500 MG capsule May take with food to minimize GI side effects. 70 capsule 3  . meloxicam (MOBIC) 15 MG tablet Take 1 tablet by mouth daily with breakfast.    . Multiple  Vitamins-Minerals (MULTIVITAMIN ADULT PO) Take 1 tablet by mouth 1 day or 1 dose.    . ondansetron (ZOFRAN ODT) 4 MG disintegrating tablet Take 1 tablet (4 mg total) by mouth every 8 (eight) hours as needed for nausea or vomiting. (Patient not taking: Reported on 03/27/2021) 20 tablet 0   No current facility-administered medications for this visit.    PHYSICAL EXAMINATION: ECOG PERFORMANCE STATUS: 0 - Asymptomatic  BP (!) 152/92   Pulse 95   Temp 98.7 F (37.1 C)   Resp 20   Wt 198 lb 3.2 oz (89.9 kg)   SpO2 100%   BMI 30.14 kg/m   Filed Weights   03/27/21 1317  Weight: 198 lb 3.2 oz (89.9 kg)    Physical Exam HENT:     Head: Normocephalic and atraumatic.     Mouth/Throat:     Pharynx: No oropharyngeal exudate.  Eyes:     Pupils: Pupils are equal, round, and reactive to light.  Cardiovascular:     Rate and Rhythm: Normal rate and regular rhythm.  Pulmonary:     Effort: No respiratory distress.     Breath sounds: No wheezing.  Abdominal:     General: Bowel sounds are normal. There is no distension.     Palpations: Abdomen is soft. There is no mass.     Tenderness: There is no abdominal tenderness. There is no guarding or rebound.     Comments: Positive for mild splenomegaly.  Musculoskeletal:        General: No tenderness. Normal range of motion.     Cervical back: Normal range of motion and neck supple.  Skin:    General: Skin is warm.  Neurological:     Mental Status: He is alert and oriented to person, place, and time.  Psychiatric:        Mood and Affect: Affect normal.     LABORATORY DATA:  I have reviewed the data as listed    Component Value Date/Time   NA 141 03/27/2021 1302   NA 140 03/02/2014 0835   K 3.8 03/27/2021 1302   K 3.8 03/02/2014 0835   CL 106 03/27/2021 1302   CL 109 (H) 03/02/2014 0835   CO2 26 03/27/2021 1302   CO2 26 03/02/2014 0835   GLUCOSE 128 (H) 03/27/2021 1302   GLUCOSE 108 (H) 03/02/2014 0835   BUN 10 03/27/2021 1302    BUN 9 03/02/2014 0835   CREATININE 0.83 03/27/2021 1302   CREATININE 0.99 03/02/2014 0835   CALCIUM 8.8 (L) 03/27/2021 1302   CALCIUM 8.6 03/02/2014 0835   PROT 6.7 03/27/2021 1302   PROT 7.0 03/02/2014 0835   ALBUMIN 4.2 03/27/2021 1302   ALBUMIN 3.7 03/02/2014 0835   AST 22 03/27/2021 1302   AST 20 03/02/2014 0835   ALT 18 03/27/2021 1302   ALT 24 03/02/2014 0835   ALKPHOS 89 03/27/2021 1302   ALKPHOS 96 03/02/2014 0835   BILITOT 0.8 03/27/2021 1302   BILITOT 0.6 03/02/2014 1950  GFRNONAA >60 03/27/2021 1302   GFRNONAA >60 03/02/2014 0835   GFRAA >60 08/15/2020 0953   GFRAA >60 03/02/2014 0835    No results found for: SPEP, UPEP  Lab Results  Component Value Date   WBC 9.6 03/27/2021   NEUTROABS 8.2 (H) 03/27/2021   HGB 12.4 (L) 03/27/2021   HCT 41.3 03/27/2021   MCV 91.6 03/27/2021   PLT 251 03/27/2021      Chemistry      Component Value Date/Time   NA 141 03/27/2021 1302   NA 140 03/02/2014 0835   K 3.8 03/27/2021 1302   K 3.8 03/02/2014 0835   CL 106 03/27/2021 1302   CL 109 (H) 03/02/2014 0835   CO2 26 03/27/2021 1302   CO2 26 03/02/2014 0835   BUN 10 03/27/2021 1302   BUN 9 03/02/2014 0835   CREATININE 0.83 03/27/2021 1302   CREATININE 0.99 03/02/2014 0835      Component Value Date/Time   CALCIUM 8.8 (L) 03/27/2021 1302   CALCIUM 8.6 03/02/2014 0835   ALKPHOS 89 03/27/2021 1302   ALKPHOS 96 03/02/2014 0835   AST 22 03/27/2021 1302   AST 20 03/02/2014 0835   ALT 18 03/27/2021 1302   ALT 24 03/02/2014 0835   BILITOT 0.8 03/27/2021 1302   BILITOT 0.6 03/02/2014 0835       RADIOGRAPHIC STUDIES: I have personally reviewed the radiological images as listed and agreed with the findings in the report. No results found.   ASSESSMENT & PLAN:  Polycythemia vera ([place]) # Polycythemia vera Jak 2+; on Hydrea 500 mg twice a day; except Saturday Sunday-extra pill. STABLE:  June 2021-bone marrow biopsy persistent myeloproliferative disease; no evidence of  transformation or leukemia. STABLE. [place]- April 2022- STABLE; 1143 cc/ severe splenomegaly but over all STABLE.  No concerns for any transformation.    # Today-hematocrit is 41 ; proceed with phlebotomy today.  #Low grade Fevers [99- 100.4 at most]-resolved ? Etiology. Monitor closely.   # Shingles vaccine/Covid vaccine: S/p Covid vaccination.  Reminded the patient of shingles vaccination [given need for Jakafu in the future].  #Disposition # NO Phlebotomy today; # in 95month-H&H; possible phlebotomy. # in 4 months- MD; cbc/cmp/LDH-possible phlebotomy;-Dr.B   Orders Placed This Encounter  Procedures  . Hematocrit ([facility])    Standing Status:   Future    Standing Expiration Date:   03/27/2022  . Hemoglobin ([facility]    Standing Status:   Future    Standing Expiration Date:   03/27/2022  . Comprehensive metabolic panel    Standing Status:   Future    Standing Expiration Date:   03/27/2022  . CBC with Differential    Standing Status:   Future    Standing Expiration Date:   03/27/2022  . Lactate dehydrogenase    Standing Status:   Future    Standing Expiration Date:   03/27/2022   All questions were answered. The patient knows to call the clinic with any problems, questions or concerns.      GCammie Sickle MD 03/27/2021 1:52 PM

## 2021-05-01 DIAGNOSIS — S32040D Wedge compression fracture of fourth lumbar vertebra, subsequent encounter for fracture with routine healing: Secondary | ICD-10-CM | POA: Diagnosis not present

## 2021-05-08 ENCOUNTER — Telehealth: Payer: Self-pay | Admitting: *Deleted

## 2021-05-08 DIAGNOSIS — D45 Polycythemia vera: Secondary | ICD-10-CM

## 2021-05-08 NOTE — Telephone Encounter (Signed)
Lab appointment scheduled for tomorrow 5/24 @ 11:30 pt is aware.

## 2021-05-08 NOTE — Telephone Encounter (Signed)
Patient called asking for his appointment to be moved up, He is having more frequent headaches again and thinks he may need phlebotomy. Please advise

## 2021-05-08 NOTE — Telephone Encounter (Signed)
T-Please order cbc; will decide if he needs a phlebotomy any sooner. GB

## 2021-05-09 ENCOUNTER — Inpatient Hospital Stay: Payer: PPO | Attending: Internal Medicine

## 2021-05-09 DIAGNOSIS — D45 Polycythemia vera: Secondary | ICD-10-CM | POA: Diagnosis not present

## 2021-05-09 LAB — CBC WITH DIFFERENTIAL/PLATELET
Abs Immature Granulocytes: 0.05 10*3/uL (ref 0.00–0.07)
Basophils Absolute: 0.1 10*3/uL (ref 0.0–0.1)
Basophils Relative: 1 %
Eosinophils Absolute: 0.2 10*3/uL (ref 0.0–0.5)
Eosinophils Relative: 2 %
HCT: 41.7 % (ref 39.0–52.0)
Hemoglobin: 12.7 g/dL — ABNORMAL LOW (ref 13.0–17.0)
Immature Granulocytes: 1 %
Lymphocytes Relative: 11 %
Lymphs Abs: 0.9 10*3/uL (ref 0.7–4.0)
MCH: 27.3 pg (ref 26.0–34.0)
MCHC: 30.5 g/dL (ref 30.0–36.0)
MCV: 89.7 fL (ref 80.0–100.0)
Monocytes Absolute: 0.3 10*3/uL (ref 0.1–1.0)
Monocytes Relative: 4 %
Neutro Abs: 6.5 10*3/uL (ref 1.7–7.7)
Neutrophils Relative %: 81 %
Platelets: 280 10*3/uL (ref 150–400)
RBC: 4.65 MIL/uL (ref 4.22–5.81)
RDW: 14.8 % (ref 11.5–15.5)
WBC: 8 10*3/uL (ref 4.0–10.5)
nRBC: 0 % (ref 0.0–0.2)

## 2021-05-21 IMAGING — US US ABDOMEN LIMITED
1 series · 10 of 10 positions shown · non-contrast
Comparison: 12/26/2018

CLINICAL DATA: History of polycythemia

EXAM:
ULTRASOUND ABDOMEN LIMITED

[Series 1: us abdomen limited · 0.28mm/px · 10 of 10 slices shown]
[im 1/10]
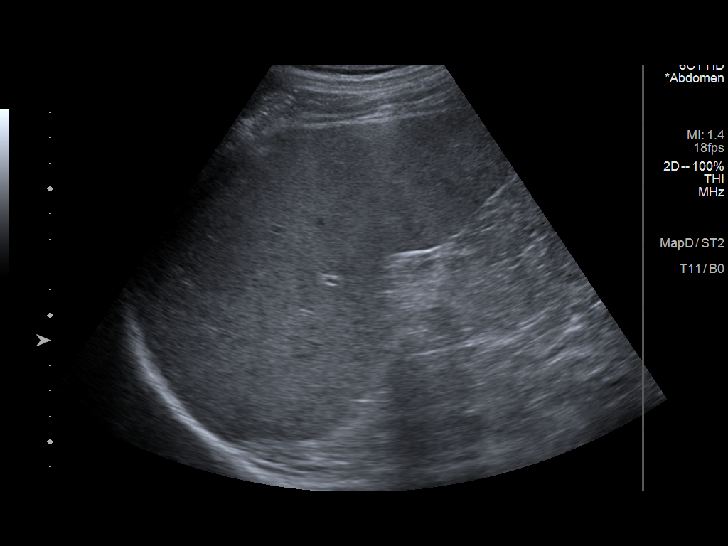
[im 2/10]
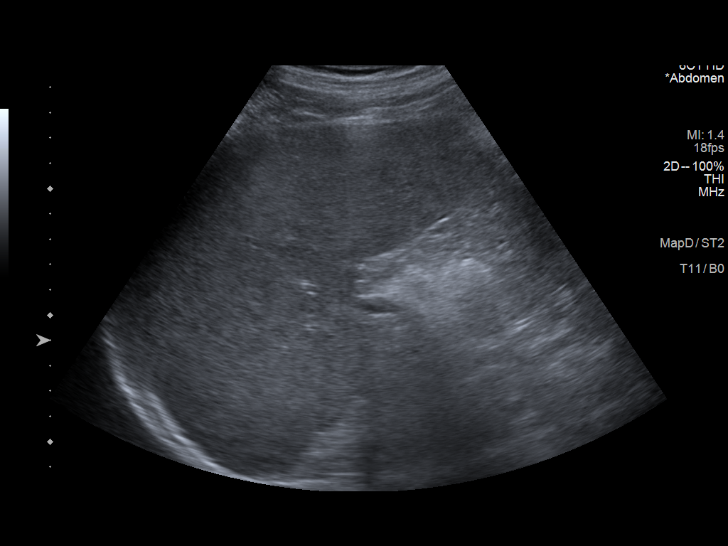
[im 3/10]
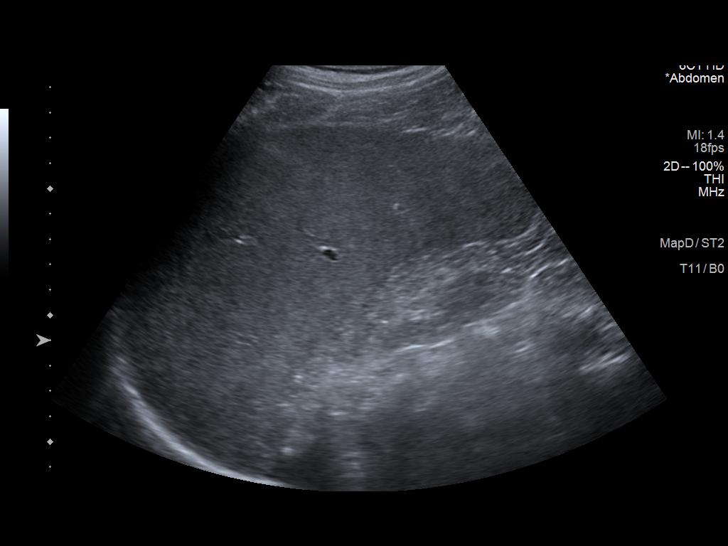
[im 4/10]
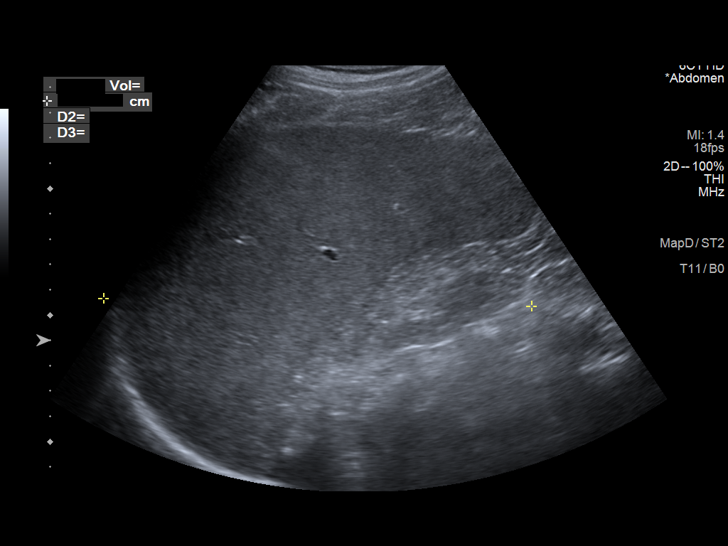
[im 5/10]
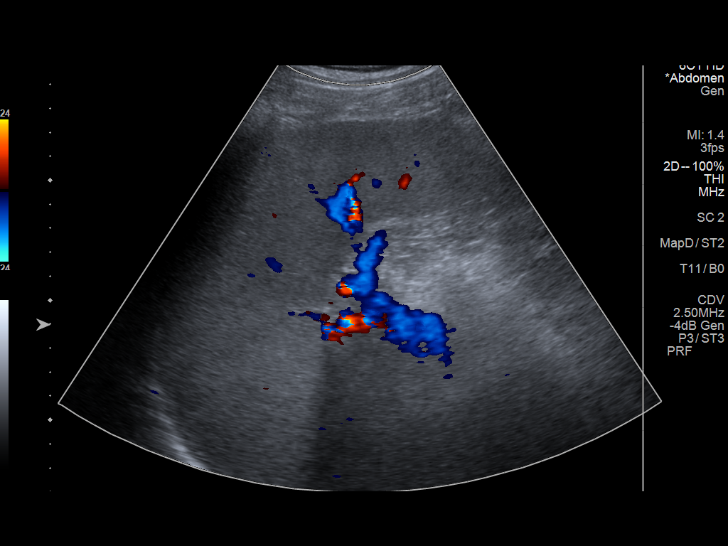
[im 6/10]
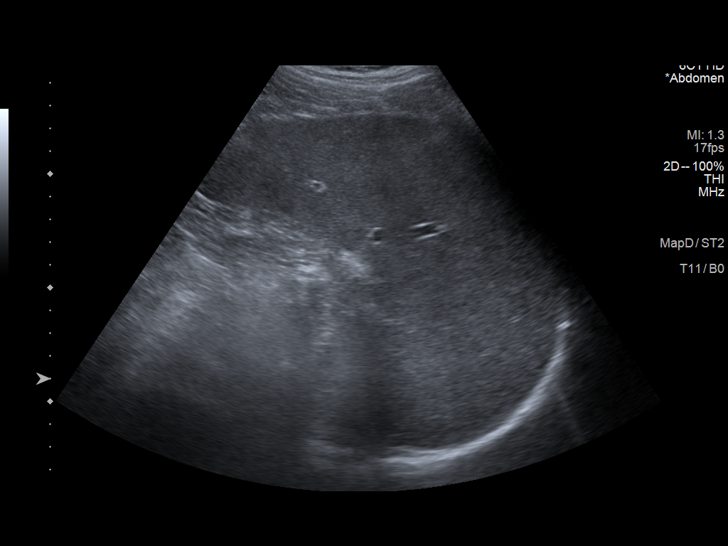
[im 7/10]
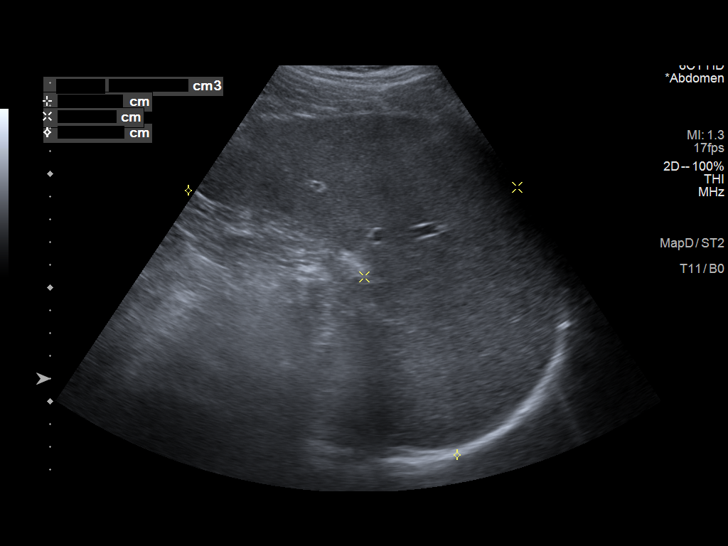
[im 8/10]
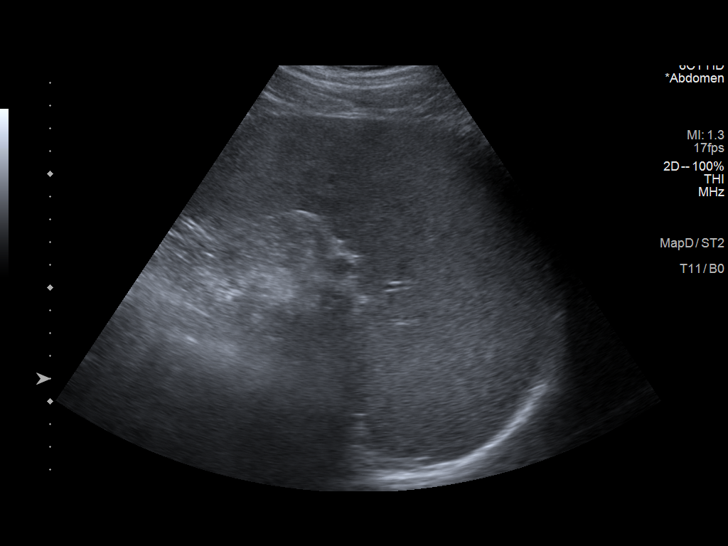
[im 9/10]
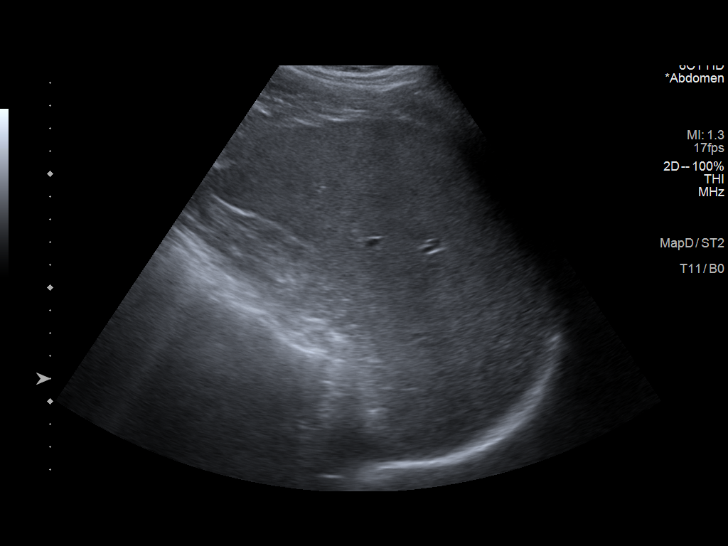
[im 10/10]
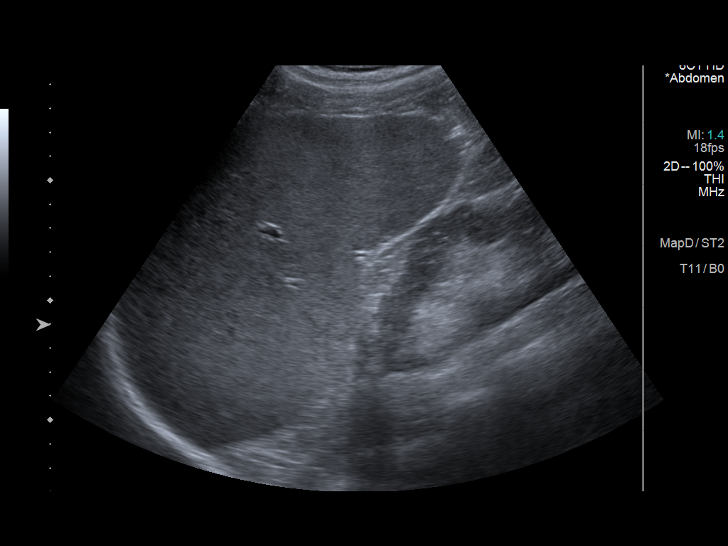

[10 of 10 positions shown; findings below may reference images not displayed]

FINDINGS: Spleen is mildly enlarged measuring 16.9 cm in greatest length. This
is stable in appearance from the prior exam. Calculated volume is
1142.9 mL this is relatively stable from the prior study. No focal
splenic lesion is noted.
IMPRESSION: Splenomegaly stable from the prior exam.

## 2021-05-30 ENCOUNTER — Inpatient Hospital Stay: Payer: PPO

## 2021-05-30 ENCOUNTER — Other Ambulatory Visit: Payer: Self-pay

## 2021-05-30 ENCOUNTER — Inpatient Hospital Stay: Payer: PPO | Attending: Internal Medicine

## 2021-05-30 VITALS — BP 132/83 | HR 84 | Temp 96.6°F | Resp 16

## 2021-05-30 DIAGNOSIS — D751 Secondary polycythemia: Secondary | ICD-10-CM

## 2021-05-30 DIAGNOSIS — D45 Polycythemia vera: Secondary | ICD-10-CM | POA: Insufficient documentation

## 2021-05-30 LAB — HEMATOCRIT: HCT: 43.1 % (ref 39.0–52.0)

## 2021-05-30 LAB — HEMOGLOBIN: Hemoglobin: 13.2 g/dL (ref 13.0–17.0)

## 2021-06-03 IMAGING — CR DG CHEST 2V
2 series · 2 of 2 positions shown · non-contrast
Comparison: CT chest of 03/18/2015
COMPARISON: CT chest of 03/18/2015

Addendum:
CLINICAL DATA: Cough cough, fever

EXAM:
CHEST - 2 VIEW

[chest pa]
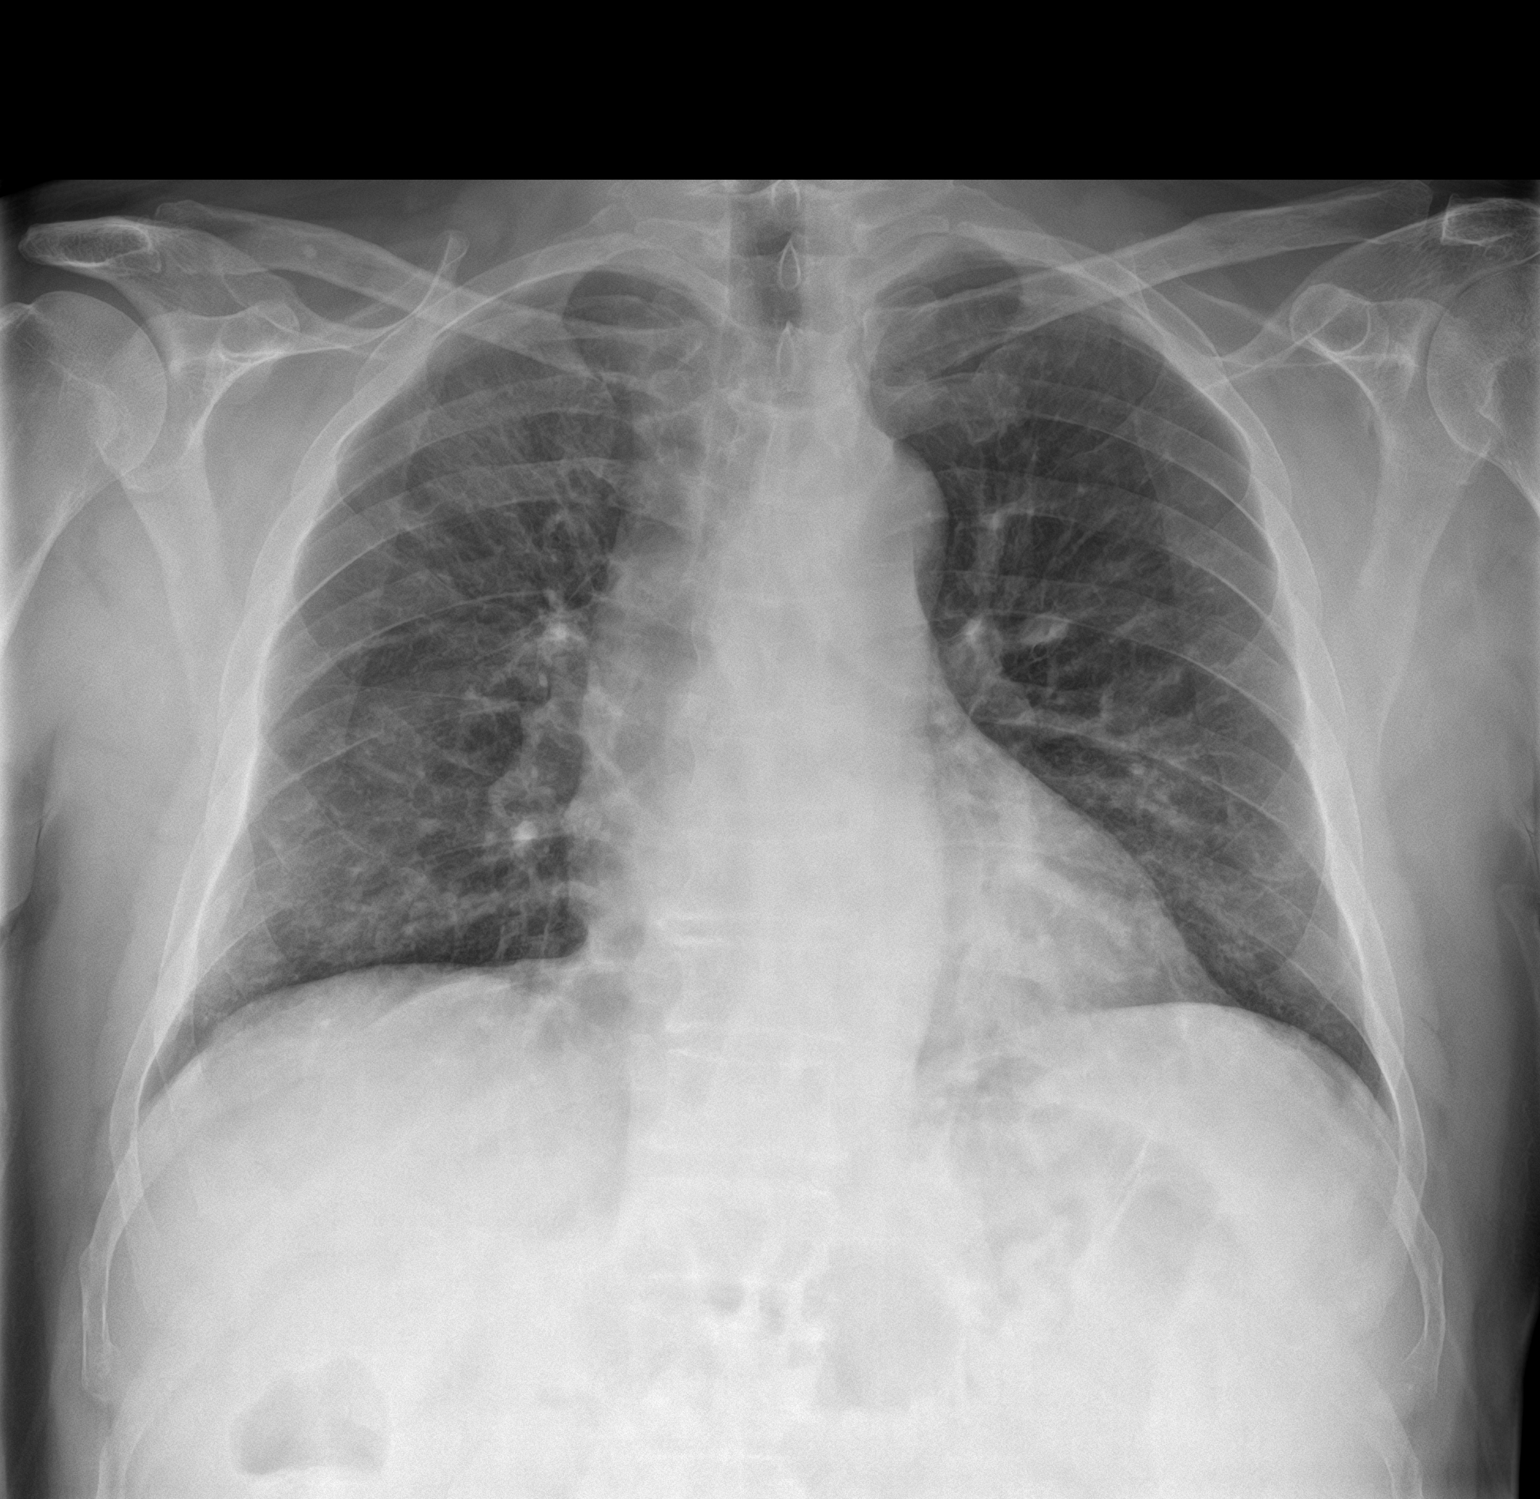

[chest lat]
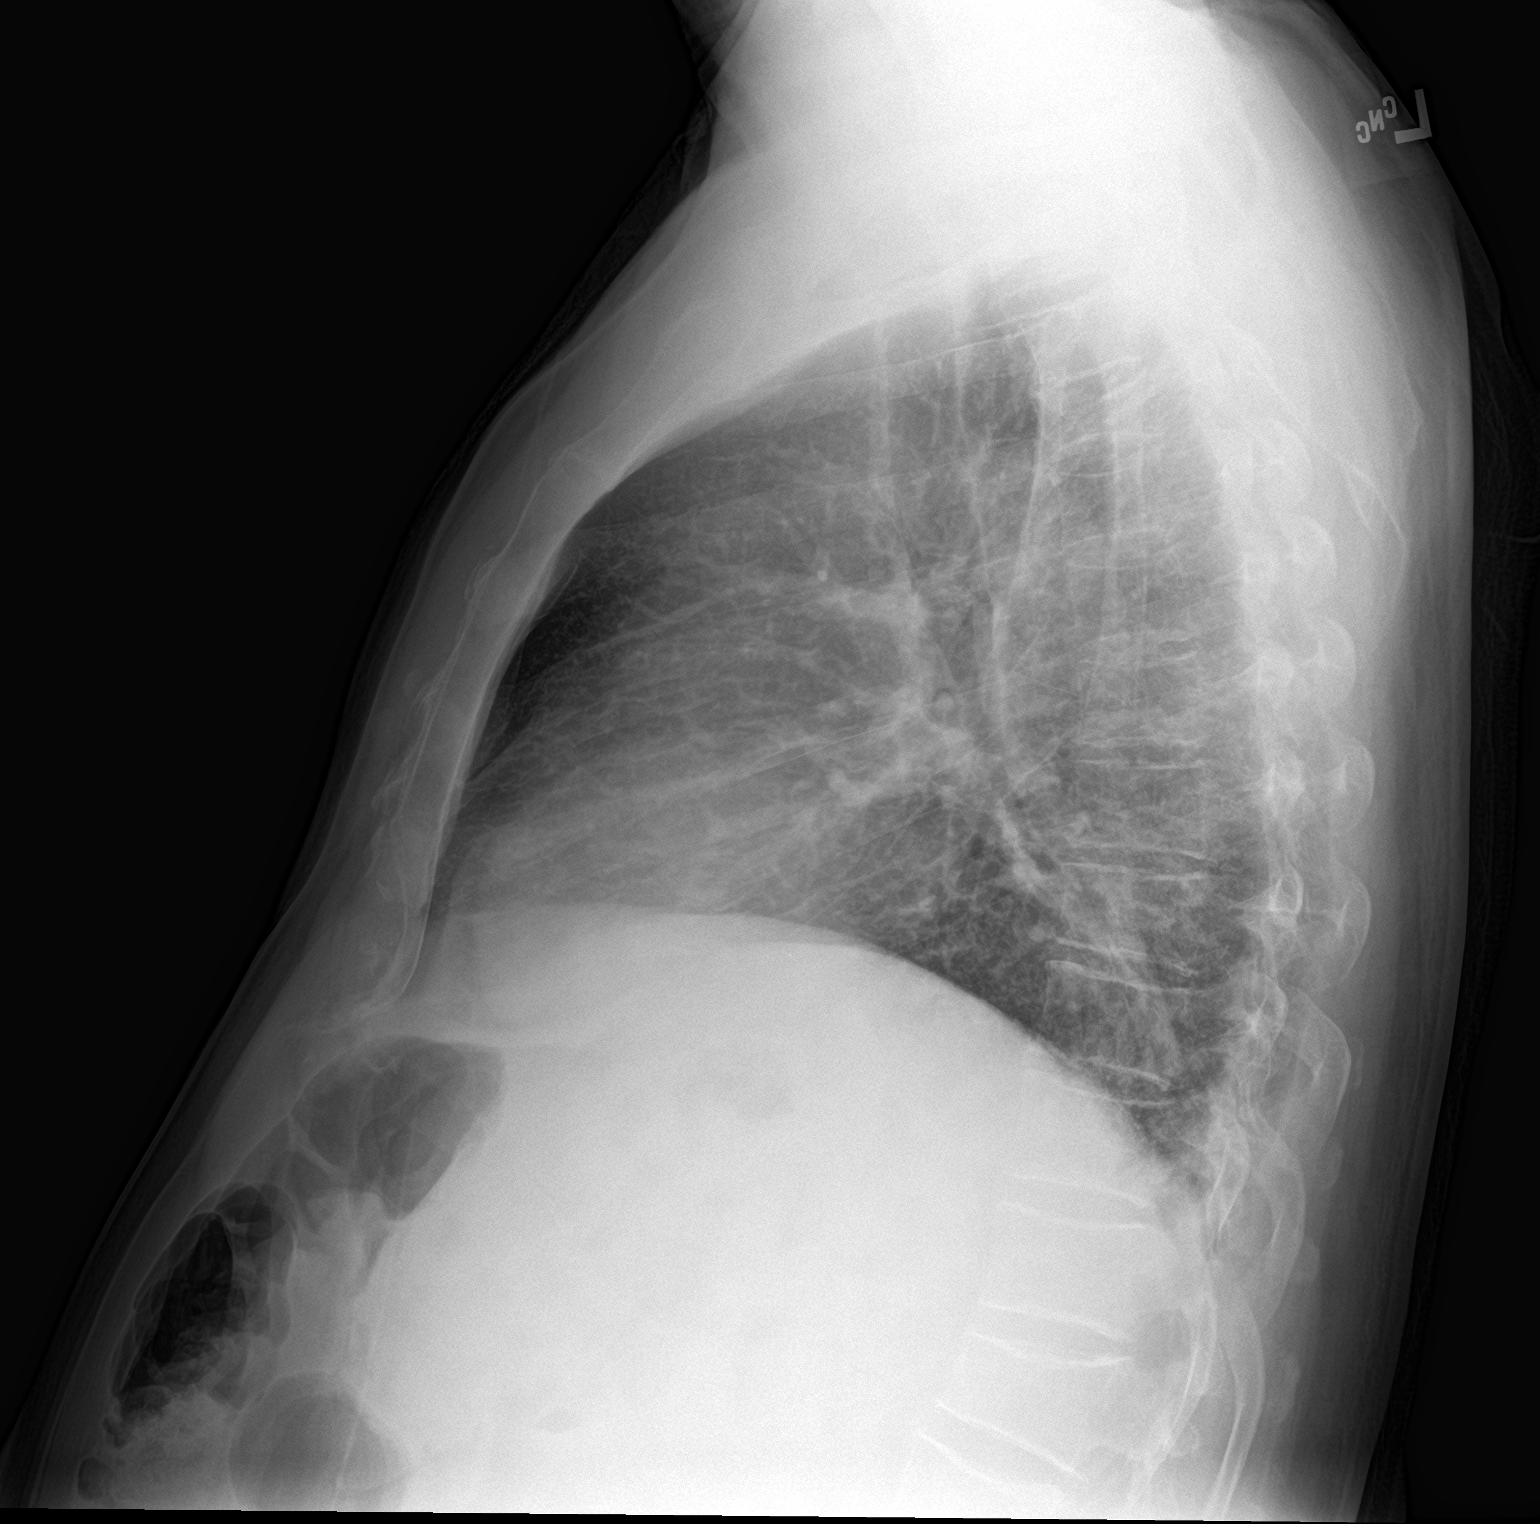

[2 of 2 positions shown; findings below may reference images not displayed]

FINDINGS: Cardiomediastinal contours are normal. Subtle increased interstitial
markings with slight asymmetry. No signs of dense consolidation or
effusion.

Visualized skeletal structures are unremarkable.
IMPRESSION: 1. Subtle increased interstitial markings with slight asymmetry.
Findings could represent early viral or atypical pneumonia.
2. No signs of dense consolidation or effusion.

ADDENDUM:
Subtle retrocardiac opacity could also represent developing
infection or atelectasis.

*** End of Addendum ***
FINDINGS: Cardiomediastinal contours are normal. Subtle increased interstitial
markings with slight asymmetry. No signs of dense consolidation or
effusion.

Visualized skeletal structures are unremarkable.
IMPRESSION: 1. Subtle increased interstitial markings with slight asymmetry.
Findings could represent early viral or atypical pneumonia.
2. No signs of dense consolidation or effusion.

## 2021-06-06 IMAGING — DX DG CHEST 1V PORT
1 series · 1 of 1 positions shown · non-contrast
Comparison: 12/08/2019

CLINICAL DATA: [id number]

EXAM:
PORTABLE CHEST 1 VIEW

[chest ap]
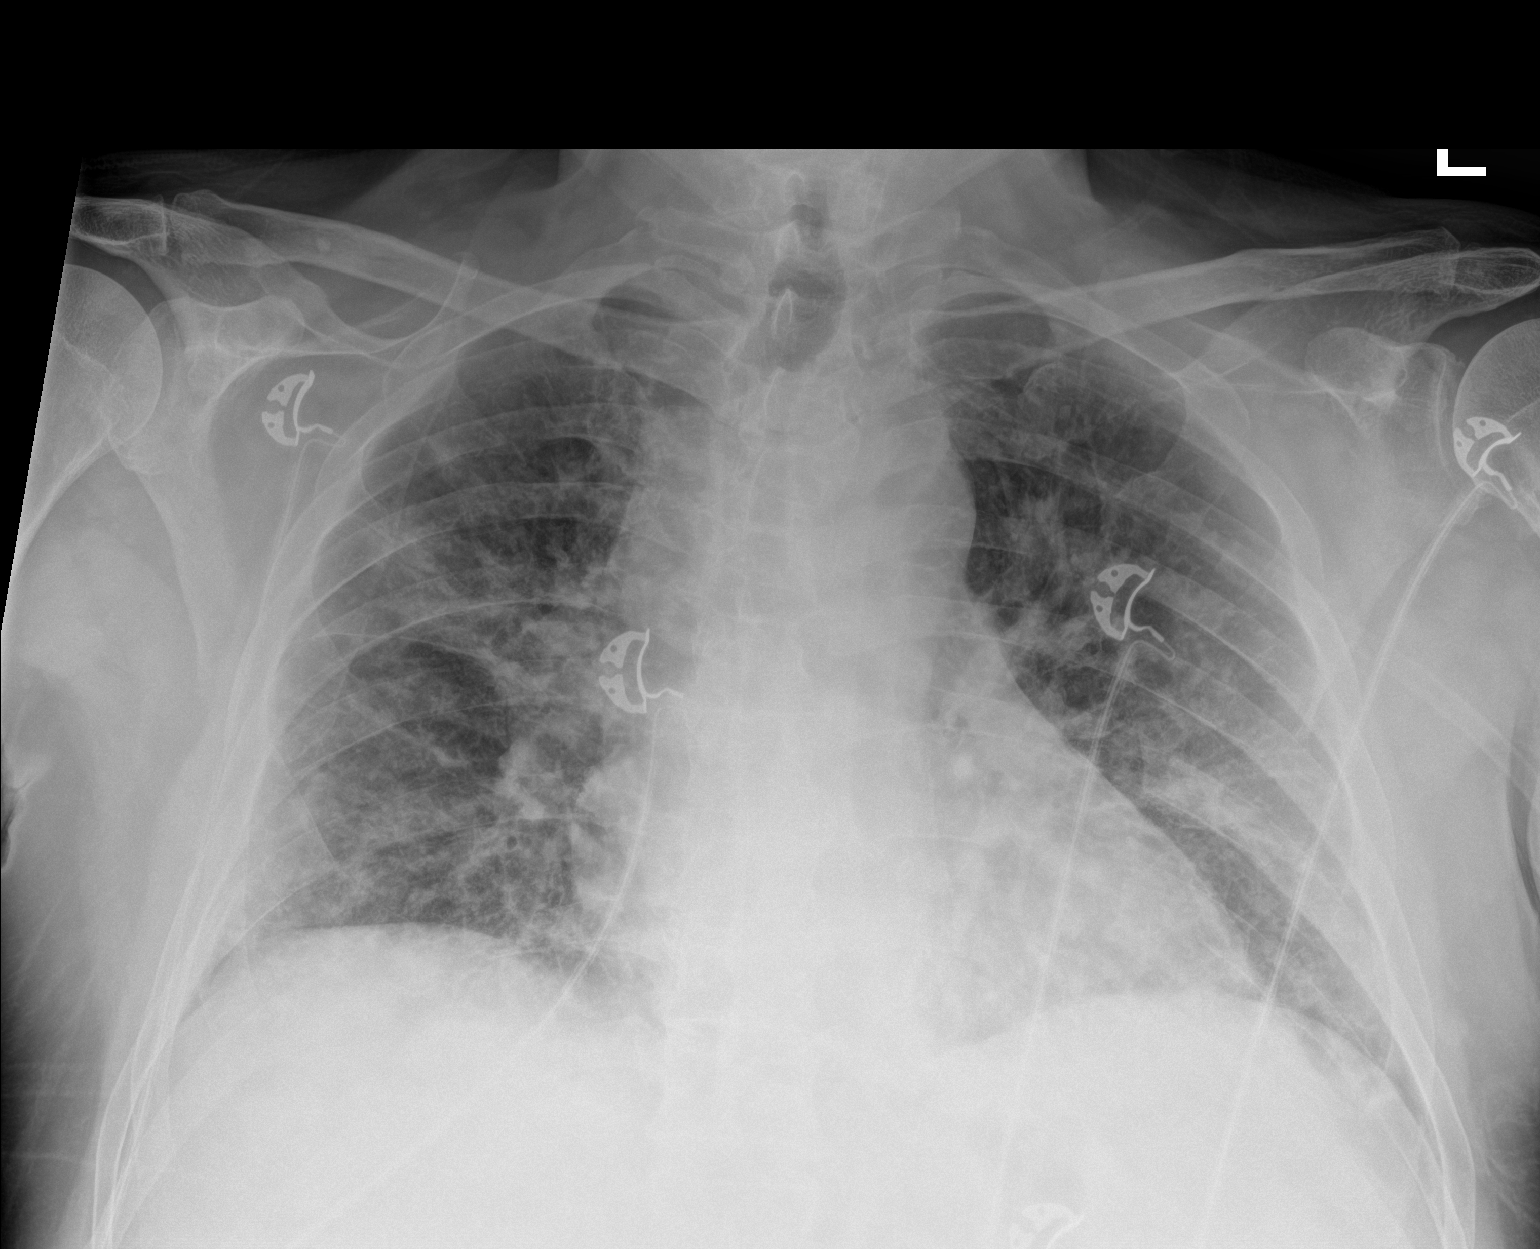

[1 of 1 positions shown; findings below may reference images not displayed]

FINDINGS: Worsening bilateral pulmonary opacities. No pleural effusion or
pneumothorax. Normal cardiomediastinal contours.
IMPRESSION: Worsening bilateral pulmonary opacities, likely indicating worsening
multifocal infection.

## 2021-06-20 DIAGNOSIS — Z13220 Encounter for screening for lipoid disorders: Secondary | ICD-10-CM | POA: Diagnosis not present

## 2021-06-20 DIAGNOSIS — I10 Essential (primary) hypertension: Secondary | ICD-10-CM | POA: Diagnosis not present

## 2021-07-01 ENCOUNTER — Other Ambulatory Visit: Payer: Self-pay | Admitting: Internal Medicine

## 2021-07-01 DIAGNOSIS — D45 Polycythemia vera: Secondary | ICD-10-CM

## 2021-07-03 ENCOUNTER — Encounter: Payer: Self-pay | Admitting: Internal Medicine

## 2021-07-04 ENCOUNTER — Other Ambulatory Visit: Payer: Self-pay | Admitting: *Deleted

## 2021-07-04 DIAGNOSIS — D45 Polycythemia vera: Secondary | ICD-10-CM

## 2021-07-04 NOTE — Telephone Encounter (Signed)
Cecille Rubin- this prescription was sent yesterday

## 2021-07-04 NOTE — Telephone Encounter (Signed)
Patient requests refill for Hydrea 500 mg order pended per MD approval.

## 2021-07-11 DIAGNOSIS — I10 Essential (primary) hypertension: Secondary | ICD-10-CM | POA: Diagnosis not present

## 2021-07-11 DIAGNOSIS — E781 Pure hyperglyceridemia: Secondary | ICD-10-CM | POA: Diagnosis not present

## 2021-07-11 DIAGNOSIS — Z0000 Encounter for general adult medical examination without abnormal findings: Secondary | ICD-10-CM | POA: Diagnosis not present

## 2021-07-11 DIAGNOSIS — Z1159 Encounter for screening for other viral diseases: Secondary | ICD-10-CM | POA: Diagnosis not present

## 2021-07-31 ENCOUNTER — Other Ambulatory Visit: Payer: Self-pay

## 2021-07-31 ENCOUNTER — Inpatient Hospital Stay (HOSPITAL_BASED_OUTPATIENT_CLINIC_OR_DEPARTMENT_OTHER): Payer: PPO | Admitting: Internal Medicine

## 2021-07-31 ENCOUNTER — Inpatient Hospital Stay: Payer: PPO

## 2021-07-31 ENCOUNTER — Inpatient Hospital Stay: Payer: PPO | Attending: Internal Medicine

## 2021-07-31 DIAGNOSIS — D45 Polycythemia vera: Secondary | ICD-10-CM | POA: Diagnosis not present

## 2021-07-31 DIAGNOSIS — Z79899 Other long term (current) drug therapy: Secondary | ICD-10-CM | POA: Diagnosis not present

## 2021-07-31 LAB — CBC WITH DIFFERENTIAL/PLATELET
Abs Immature Granulocytes: 0.09 10*3/uL — ABNORMAL HIGH (ref 0.00–0.07)
Basophils Absolute: 0.1 10*3/uL (ref 0.0–0.1)
Basophils Relative: 1 %
Eosinophils Absolute: 0.2 10*3/uL (ref 0.0–0.5)
Eosinophils Relative: 1 %
HCT: 42.8 % (ref 39.0–52.0)
Hemoglobin: 13.1 g/dL (ref 13.0–17.0)
Immature Granulocytes: 1 %
Lymphocytes Relative: 7 %
Lymphs Abs: 0.9 10*3/uL (ref 0.7–4.0)
MCH: 27.2 pg (ref 26.0–34.0)
MCHC: 30.6 g/dL (ref 30.0–36.0)
MCV: 89 fL (ref 80.0–100.0)
Monocytes Absolute: 0.3 10*3/uL (ref 0.1–1.0)
Monocytes Relative: 3 %
Neutro Abs: 10.9 10*3/uL — ABNORMAL HIGH (ref 1.7–7.7)
Neutrophils Relative %: 87 %
Platelets: 217 10*3/uL (ref 150–400)
RBC: 4.81 MIL/uL (ref 4.22–5.81)
RDW: 15.1 % (ref 11.5–15.5)
Smear Review: NORMAL
WBC: 12.5 10*3/uL — ABNORMAL HIGH (ref 4.0–10.5)
nRBC: 0 % (ref 0.0–0.2)

## 2021-07-31 LAB — COMPREHENSIVE METABOLIC PANEL
ALT: 20 U/L (ref 0–44)
AST: 23 U/L (ref 15–41)
Albumin: 4.2 g/dL (ref 3.5–5.0)
Alkaline Phosphatase: 101 U/L (ref 38–126)
Anion gap: 9 (ref 5–15)
BUN: 11 mg/dL (ref 8–23)
CO2: 25 mmol/L (ref 22–32)
Calcium: 8.7 mg/dL — ABNORMAL LOW (ref 8.9–10.3)
Chloride: 104 mmol/L (ref 98–111)
Creatinine, Ser: 0.81 mg/dL (ref 0.61–1.24)
GFR, Estimated: >60 mL/min (ref >60)
Glucose, Bld: 97 mg/dL (ref 70–99)
Potassium: 3.8 mmol/L (ref 3.5–5.1)
Sodium: 138 mmol/L (ref 135–145)
Total Bilirubin: 1 mg/dL (ref 0.3–1.2)
Total Protein: 6.8 g/dL (ref 6.5–8.1)

## 2021-07-31 LAB — LACTATE DEHYDROGENASE: LDH: 201 U/L — ABNORMAL HIGH (ref 98–192)

## 2021-07-31 NOTE — Assessment & Plan Note (Addendum)
#  Polycythemia vera Jak 2+; on Hydrea 500 mg twice a day; except Saturday Sunday-extra pill. STABLE:  June 2021-bone marrow biopsy persistent myeloproliferative disease; no evidence of transformation or leukemia. STABLE. [place]- April 2022- STABLE; 1143 cc/ severe splenomegaly but over all STABLE.No concerns for any transformation.    # Today-hematocrit is 42.8- HOLD OFF phlebotomy today.  We will repeat ultrasound in next few months.  # Shingles vaccine/Covid vaccine: S/p Covid vaccination. S/p shingles  x1 [[given need for Jakafu in the future].  #Disposition # NO Phlebotomy today; # in 1 months-H&H; possible phlebotomy. # in 62months-H&H; possible phlebotomy. # in 3 months- MD; cbc/cmp/LDH-possible phlebotomy;-Dr.B

## 2021-07-31 NOTE — Progress Notes (Signed)
[place] OFFICE PROGRESS NOTE  Patient Care Team: Dion Body, MD as PCP - General (Family Medicine) Cammie Sickle, MD as Consulting Physician (Internal Medicine)  Cancer Staging No matching staging information was found for the patient.   Oncology History Overview Note  # 2014- POLYCYTHEMIA VERA - JAK-2 Positive; on Hydrea 565m/day; aspirin 81 mg a day; phlebotomy for Hct >43 [headaches];   # MAY 2019- BMBx- pan-hypercellular; no evidence of significant fibrosis; [place] 1389 cc/ splenomegaly; Oct 11th 2019- Increase in splenomegaly; Increased Hydrea 15054mday.   # nonarteritic ischemic optic neuropathy-Left eye [may 2018; [facility] ---------------------------------------    DIAGNOSIS: '[ ]'  POLYCYTHEMIA VERA  ;GOALS: control  CURRENT/MOST RECENT THERAPY '[ ]'  Hydrea        Polycythemia vera (HCC)      INTERVAL HISTORY:  Derek Spilde89.o.  male pleasant patient above history of polycythemia vera on hydrea is here for follow-up.  No new symptoms.  No nausea no vomiting no weight loss no night sweats. Denies any fevers.  Review of Systems  Constitutional:  Negative for chills, diaphoresis and weight loss.  HENT:  Negative for nosebleeds and sore throat.   Eyes:  Negative for double vision.  Respiratory:  Negative for cough, hemoptysis, sputum production, shortness of breath and wheezing.   Cardiovascular:  Negative for chest pain, palpitations, orthopnea and leg swelling.  Gastrointestinal:  Negative for blood in stool, constipation, diarrhea, heartburn, melena, nausea and vomiting.  Genitourinary:  Negative for dysuria, frequency and urgency.  Musculoskeletal:  Negative for back pain and joint pain.  Skin: Negative.  Negative for itching and rash.  Neurological:  Negative for dizziness, tingling, focal weakness, weakness and headaches.  Endo/Heme/Allergies:  Does not bruise/bleed easily.   Psychiatric/Behavioral:  Negative for depression. The patient is not nervous/anxious and does not have insomnia.      PAST MEDICAL HISTORY :  Past Medical History:  Diagnosis Date   Arthritis    GERD (gastroesophageal reflux disease)    Gout    Low serum testosterone level    [facility] ([facility]   Polycythemia, secondary 06/08/2015   Pulmonary nodules    Thoracic spine fracture (HCC)    compression fracture following a fall    PAST SURGICAL HISTORY :   Past Surgical History:  Procedure Laterality Date   COLONOSCOPY  2013    FAMILY HISTORY :   Family History  Problem Relation Age of Onset   Throat cancer Other        uncle   Migraines Neg Hx    Multiple sclerosis Neg Hx    Neurofibromatosis Neg Hx    Parkinsonism Neg Hx    Seizures Neg Hx    Neuropathy Neg Hx     SOCIAL HISTORY:   Social History   Tobacco Use   Smoking status: Never   Smokeless tobacco: Never  Substance Use Topics   Alcohol use: Yes    Alcohol/week: 0.0 standard drinks    Comment: occasional alcohol use   Drug use: No    ALLERGIES:  is allergic to penicillin g.  MEDICATIONS:  Current Outpatient Medications  Medication Sig Dispense Refill   acetaminophen (TYLENOL) 500 MG tablet Take 500 mg by mouth every 6 (six) hours as needed for fever.     amLODipine (NORVASC) 5 MG tablet Take 5 mg daily by mouth.  11   aspirin EC 81 MG tablet Take 81 mg daily by mouth.  Cholecalciferol 25 MCG (1000 UT) tablet Take 1,000 Units daily by mouth.      Esomeprazole Magnesium (NEXIUM PO) Take 2 capsules by mouth daily.      hydroxyurea (HYDREA) 500 MG capsule TAKE 1 CAPSULE BY MOUTH TWICE A DAY EXCEPT SATURDAY AND SUNDAY MAY TAKE WITH FOOD TO MINIMIZE GI SIDE EFFECTS. 210 capsule 1   ibuprofen (ADVIL) 200 MG tablet Take 800 mg by mouth once a week. For headache as needed     Multiple Vitamins-Minerals (MULTIVITAMIN ADULT PO) Take 1 tablet by mouth 1 day or 1 dose.     ondansetron (ZOFRAN ODT) 4 MG  disintegrating tablet Take 1 tablet (4 mg total) by mouth every 8 (eight) hours as needed for nausea or vomiting. (Patient not taking: Reported on 07/31/2021) 20 tablet 0   No current facility-administered medications for this visit.    PHYSICAL EXAMINATION: ECOG PERFORMANCE STATUS: 0 - Asymptomatic  BP 137/88   Pulse 92   Temp (!) 97.5 F (36.4 C) (Tympanic)   Resp 20   Ht 5' 8.5" (1.74 m)   Wt 198 lb (89.8 kg)   BMI 29.67 kg/m   Filed Weights   07/31/21 1309  Weight: 198 lb (89.8 kg)    Physical Exam HENT:     Head: Normocephalic and atraumatic.     Mouth/Throat:     Pharynx: No oropharyngeal exudate.  Eyes:     Pupils: Pupils are equal, round, and reactive to light.  Cardiovascular:     Rate and Rhythm: Normal rate and regular rhythm.  Pulmonary:     Effort: No respiratory distress.     Breath sounds: No wheezing.  Abdominal:     General: Bowel sounds are normal. There is no distension.     Palpations: Abdomen is soft. There is no mass.     Tenderness: no abdominal tenderness There is no guarding or rebound.     Comments: Positive for mild splenomegaly.  Musculoskeletal:        General: No tenderness. Normal range of motion.     Cervical back: Normal range of motion and neck supple.  Skin:    General: Skin is warm.  Neurological:     Mental Status: He is alert and oriented to person, place, and time.  Psychiatric:        Mood and Affect: Affect normal.    LABORATORY DATA:  I have reviewed the data as listed    Component Value Date/Time   NA 138 07/31/2021 1239   NA 140 03/02/2014 0835   K 3.8 07/31/2021 1239   K 3.8 03/02/2014 0835   CL 104 07/31/2021 1239   CL 109 (H) 03/02/2014 0835   CO2 25 07/31/2021 1239   CO2 26 03/02/2014 0835   GLUCOSE 97 07/31/2021 1239   GLUCOSE 108 (H) 03/02/2014 0835   BUN 11 07/31/2021 1239   BUN 9 03/02/2014 0835   CREATININE 0.81 07/31/2021 1239   CREATININE 0.99 03/02/2014 0835   CALCIUM 8.7 (L) 07/31/2021 1239    CALCIUM 8.6 03/02/2014 0835   PROT 6.8 07/31/2021 1239   PROT 7.0 03/02/2014 0835   ALBUMIN 4.2 07/31/2021 1239   ALBUMIN 3.7 03/02/2014 0835   AST 23 07/31/2021 1239   AST 20 03/02/2014 0835   ALT 20 07/31/2021 1239   ALT 24 03/02/2014 0835   ALKPHOS 101 07/31/2021 1239   ALKPHOS 96 03/02/2014 0835   BILITOT 1.0 07/31/2021 1239   BILITOT 0.6 03/02/2014 0835   GFRNONAA >60  07/31/2021 1239   GFRNONAA >60 03/02/2014 0835   GFRAA >60 08/15/2020 0953   GFRAA >60 03/02/2014 0835    No results found for: SPEP, UPEP  Lab Results  Component Value Date   WBC 12.5 (H) 07/31/2021   NEUTROABS 10.9 (H) 07/31/2021   HGB 13.1 07/31/2021   HCT 42.8 07/31/2021   MCV 89.0 07/31/2021   PLT 217 07/31/2021      Chemistry      Component Value Date/Time   NA 138 07/31/2021 1239   NA 140 03/02/2014 0835   K 3.8 07/31/2021 1239   K 3.8 03/02/2014 0835   CL 104 07/31/2021 1239   CL 109 (H) 03/02/2014 0835   CO2 25 07/31/2021 1239   CO2 26 03/02/2014 0835   BUN 11 07/31/2021 1239   BUN 9 03/02/2014 0835   CREATININE 0.81 07/31/2021 1239   CREATININE 0.99 03/02/2014 0835      Component Value Date/Time   CALCIUM 8.7 (L) 07/31/2021 1239   CALCIUM 8.6 03/02/2014 0835   ALKPHOS 101 07/31/2021 1239   ALKPHOS 96 03/02/2014 0835   AST 23 07/31/2021 1239   AST 20 03/02/2014 0835   ALT 20 07/31/2021 1239   ALT 24 03/02/2014 0835   BILITOT 1.0 07/31/2021 1239   BILITOT 0.6 03/02/2014 0835       RADIOGRAPHIC STUDIES: I have personally reviewed the radiological images as listed and agreed with the findings in the report. No results found.   ASSESSMENT & PLAN:  Polycythemia vera ([place]) # Polycythemia vera Jak 2+; on Hydrea 500 mg twice a day; except Saturday Sunday-extra pill. STABLE:  June 2021-bone marrow biopsy persistent myeloproliferative disease; no evidence of transformation or leukemia. STABLE. [place]- April 2022- STABLE; 1143 cc/ severe splenomegaly but over all STABLE.No concerns for  any transformation.    # Today-hematocrit is 42.8- HOLD OFF phlebotomy today.  We will repeat ultrasound in next few months.  # Shingles vaccine/Covid vaccine: S/p Covid vaccination. S/p shingles  x1 [[given need for Jakafu in the future].  #Disposition # NO Phlebotomy today; # in 1 months-H&H; possible phlebotomy. # in 30month-H&H; possible phlebotomy. # in 3 months- MD; cbc/cmp/LDH-possible phlebotomy;-Dr.B   No orders of the defined types were placed in this encounter.  All questions were answered. The patient knows to call the clinic with any problems, questions or concerns.      GCammie Sickle MD 08/13/2021 5:18 PM

## 2021-08-13 ENCOUNTER — Encounter: Payer: Self-pay | Admitting: Internal Medicine

## 2021-08-23 ENCOUNTER — Telehealth: Payer: Self-pay | Admitting: Internal Medicine

## 2021-08-23 NOTE — Telephone Encounter (Signed)
Return VM left to change appts. Pt did not answer. Left message to contact office to have appt r\s.

## 2021-08-25 ENCOUNTER — Other Ambulatory Visit: Payer: Self-pay | Admitting: *Deleted

## 2021-08-25 DIAGNOSIS — D45 Polycythemia vera: Secondary | ICD-10-CM

## 2021-08-28 ENCOUNTER — Inpatient Hospital Stay: Payer: PPO

## 2021-08-28 ENCOUNTER — Inpatient Hospital Stay: Payer: PPO | Attending: Internal Medicine

## 2021-08-28 ENCOUNTER — Other Ambulatory Visit: Payer: Self-pay

## 2021-08-28 ENCOUNTER — Other Ambulatory Visit: Payer: Self-pay | Admitting: *Deleted

## 2021-08-28 DIAGNOSIS — D45 Polycythemia vera: Secondary | ICD-10-CM

## 2021-08-28 DIAGNOSIS — D751 Secondary polycythemia: Secondary | ICD-10-CM

## 2021-08-28 LAB — HEMATOCRIT: HCT: 43 % (ref 39.0–52.0)

## 2021-08-28 LAB — HEMOGLOBIN: Hemoglobin: 13 g/dL (ref 13.0–17.0)

## 2021-08-28 MED ORDER — HYDROXYUREA 500 MG PO CAPS
ORAL_CAPSULE | ORAL | 1 refills | Status: DC
Start: 2021-08-28 — End: 2021-08-30

## 2021-08-28 NOTE — Progress Notes (Signed)
Current HCT=43. Pt informed and stated that he would prefer to have phlebotomy due to issues with headaches. Phlebotomy performed in right AC using 20 g angiocath. 351m removed. Pt tolerated well. Oral fluids provided.

## 2021-08-30 ENCOUNTER — Other Ambulatory Visit: Payer: Self-pay | Admitting: *Deleted

## 2021-08-30 DIAGNOSIS — D45 Polycythemia vera: Secondary | ICD-10-CM

## 2021-08-30 MED ORDER — HYDROXYUREA 500 MG PO CAPS: ORAL_CAPSULE | ORAL | 1 refills | Status: DC

## 2021-09-04 ENCOUNTER — Encounter: Payer: Self-pay | Admitting: Internal Medicine

## 2021-09-22 ENCOUNTER — Emergency Department: Payer: PPO

## 2021-09-22 ENCOUNTER — Other Ambulatory Visit: Payer: Self-pay

## 2021-09-22 ENCOUNTER — Emergency Department
Admission: EM | Admit: 2021-09-22 | Discharge: 2021-09-22 | Disposition: A | Discharge status: Home / Self Care | Payer: PPO | Attending: Emergency Medicine | Admitting: Emergency Medicine

## 2021-09-22 DIAGNOSIS — I10 Essential (primary) hypertension: Secondary | ICD-10-CM | POA: Insufficient documentation

## 2021-09-22 DIAGNOSIS — Z8616 Personal history of COVID-19: Secondary | ICD-10-CM | POA: Diagnosis not present

## 2021-09-22 DIAGNOSIS — R002 Palpitations: Secondary | ICD-10-CM | POA: Diagnosis not present

## 2021-09-22 DIAGNOSIS — Z79899 Other long term (current) drug therapy: Secondary | ICD-10-CM | POA: Insufficient documentation

## 2021-09-22 DIAGNOSIS — Z7982 Long term (current) use of aspirin: Secondary | ICD-10-CM | POA: Diagnosis not present

## 2021-09-22 LAB — BASIC METABOLIC PANEL
Anion gap: 11 (ref 5–15)
BUN: 9 mg/dL (ref 8–23)
CO2: 27 mmol/L (ref 22–32)
Calcium: 8.8 mg/dL — ABNORMAL LOW (ref 8.9–10.3)
Chloride: 103 mmol/L (ref 98–111)
Creatinine, Ser: 0.9 mg/dL (ref 0.61–1.24)
GFR, Estimated: >60 mL/min (ref >60)
Glucose, Bld: 93 mg/dL (ref 70–99)
Potassium: 4.2 mmol/L (ref 3.5–5.1)
Sodium: 141 mmol/L (ref 135–145)

## 2021-09-22 LAB — TROPONIN I (HIGH SENSITIVITY)
Troponin I (High Sensitivity): 4 ng/L (ref <18)
Troponin I (High Sensitivity): 6 ng/L (ref <18)

## 2021-09-22 LAB — CBC
HCT: 41.6 % (ref 39.0–52.0)
Hemoglobin: 12.9 g/dL — ABNORMAL LOW (ref 13.0–17.0)
MCH: 28.2 pg (ref 26.0–34.0)
MCHC: 31 g/dL (ref 30.0–36.0)
MCV: 90.8 fL (ref 80.0–100.0)
Platelets: 219 10*3/uL (ref 150–400)
RBC: 4.58 MIL/uL (ref 4.22–5.81)
RDW: 14.9 % (ref 11.5–15.5)
WBC: 11.1 10*3/uL — ABNORMAL HIGH (ref 4.0–10.5)
nRBC: 0 % (ref 0.0–0.2)

## 2021-09-22 NOTE — ED Notes (Signed)
Patient stable and discharged with all personal belongings and AVS. AVS and discharge instructions reviewed with patient and opportunity for questions provided.   

## 2021-09-22 NOTE — ED Triage Notes (Addendum)
Pt comes pov with palpitations for about a week. Just wants to get checked out. Lasts only a few seconds then goes away. Pt states that he had long haulers covid and is wondering if that's related. Hx polycythemia.

## 2021-09-22 NOTE — ED Provider Notes (Signed)
[facility] Emergency Department Provider Note   ____________________________________________   I have reviewed the triage vital signs and the nursing notes.   HISTORY  Chief Complaint Palpitations   History limited by: Not Limited   HPI Derek [name] is a 65 y.o. male who presents to the emergency department today because of concerns for palpitations.  Patient states that his symptoms have been present for roughly 1 week.  He will notice it occur once or twice a minute at most.  Feels like his heart stops briefly.  When this occurs he does feel almost a temporary difficulty with his breath.  The patient denies any chest pain with this.  Does state that he has been under stress this past week.  Denies any new medications or change in medications.   Records reviewed. Per medical record review patient has a history of covid-19, polycythemia vera.   Past Medical History:  Diagnosis Date   Arthritis    GERD (gastroesophageal reflux disease)    Gout    Low serum testosterone level    Polycythemia vera (HCC)    Polycythemia, secondary 06/08/2015   Pulmonary nodules    Thoracic spine fracture (HCC)    compression fracture following a fall    Patient Active Problem List   Diagnosis Date Noted   Personal history of gout 06/28/2020   Fever of unknown origin (FUO) 05/05/2020   COVID-19 virus infection 12/14/2019   Essential hypertension 12/12/2019   Hypokalemia 12/12/2019   Acute respiratory failure with hypoxia ([place]) 12/12/2019   Pneumonia due to COVID-19 virus 12/12/2019   Acute hypoxemic respiratory failure due to COVID-19 [facility]) 12/12/2019   Hypertriglyceridemia 07/22/2017   Vaccine counseling 07/22/2017   Arthritis 04/18/2016   Acid reflux 04/18/2016   Gout 04/18/2016   Polycythemia vera ([place]) 04/18/2016   Polycythemia, secondary 06/08/2015   Conjunctival hyperemia 09/28/2013   Cephalalgia 09/28/2013    Past Surgical History:  Procedure  Laterality Date   COLONOSCOPY  2013    Prior to Admission medications   Medication Sig Start Date End Date Taking? Authorizing Provider  acetaminophen (TYLENOL) 500 MG tablet Take 500 mg by mouth every 6 (six) hours as needed for fever.    [provider]  amLODipine (NORVASC) 5 MG tablet Take 5 mg daily by mouth. 10/12/17   [provider]  aspirin EC 81 MG tablet Take 81 mg daily by mouth.     [provider]  Cholecalciferol 25 MCG (1000 UT) tablet Take 1,000 Units daily by mouth.     [provider]  Esomeprazole Magnesium (NEXIUM PO) Take 2 capsules by mouth daily.     [provider]  hydroxyurea (HYDREA) 500 MG capsule TAKE 1 CAPSULE BY MOUTH TWICE A DAY EXCEPT SATURDAY AND Sunday (take an extra pill on Sat/Sunday)  MAY TAKE WITH FOOD TO MINIMIZE GI SIDE EFFECTS. 08/30/21   Cammie Sickle, MD  ibuprofen (ADVIL) 200 MG tablet Take 800 mg by mouth once a week. For headache as needed    [provider]  Multiple Vitamins-Minerals (MULTIVITAMIN ADULT PO) Take 1 tablet by mouth 1 day or 1 dose.    [provider]  ondansetron (ZOFRAN ODT) 4 MG disintegrating tablet Take 1 tablet (4 mg total) by mouth every 8 (eight) hours as needed for nausea or vomiting. Patient not taking: Reported on 07/31/2021 12/08/19   Duffy Bruce, MD    Allergies Penicillin g  Family History  Problem Relation Age of Onset  Throat cancer Other        uncle   Migraines Neg Hx    Multiple sclerosis Neg Hx    Neurofibromatosis Neg Hx    Parkinsonism Neg Hx    Seizures Neg Hx    Neuropathy Neg Hx     Social History Social History   Tobacco Use   Smoking status: Never   Smokeless tobacco: Never  Substance Use Topics   Alcohol use: Yes    Alcohol/week: 0.0 standard drinks    Comment: occasional alcohol use   Drug use: No    Review of Systems Constitutional: No fever/chills Eyes: No visual changes. ENT: No sore  throat. Cardiovascular: Denies chest pain. Positive for palpitations. Respiratory: Denies shortness of breath. Gastrointestinal: No abdominal pain.  No nausea, no vomiting.  No diarrhea.   Genitourinary: Negative for dysuria. Musculoskeletal: Negative for back pain. Skin: Negative for rash. Neurological: Negative for headaches, focal weakness or numbness.  ____________________________________________   PHYSICAL EXAM:  VITAL SIGNS: ED Triage Vitals  Enc Vitals Group     BP 09/22/21 1558 (!) 143/88     Pulse Rate 09/22/21 1558 99     Resp 09/22/21 1558 18     Temp 09/22/21 1558 98.1 F (36.7 C)     Temp Source 09/22/21 1558 Oral     SpO2 09/22/21 1558 98 %     Weight 09/22/21 1559 198 lb (89.8 kg)     Height 09/22/21 1559 5\' 8"  (1.727 m)     Head Circumference --      Peak Flow --      Pain Score 09/22/21 1559 0   Constitutional: Alert and oriented.  Eyes: Conjunctivae are normal.  ENT      Head: Normocephalic and atraumatic.      Nose: No congestion/rhinnorhea.      Mouth/Throat: Mucous membranes are moist.      Neck: No stridor. Hematological/Lymphatic/Immunilogical: No cervical lymphadenopathy. Cardiovascular: Normal rate, regular rhythm.  No murmurs, rubs, or gallops.  Respiratory: Normal respiratory effort without tachypnea nor retractions. Breath sounds are clear and equal bilaterally. No wheezes/rales/rhonchi. Gastrointestinal: Soft and non tender. No rebound. No guarding.  Genitourinary: Deferred Musculoskeletal: Normal range of motion in all extremities. No lower extremity edema. Neurologic:  Normal speech and language. No gross focal neurologic deficits are appreciated.  Skin:  Skin is warm, dry and intact. No rash noted. Psychiatric: Mood and affect are normal. Speech and behavior are normal. Patient exhibits appropriate insight and judgment.  ____________________________________________    LABS (pertinent positives/negatives)  Trop hs 4 CBC wbc 11.1, hgb  12.9, plt 219 BMP wnl except ca 8.8  ____________________________________________   EKG  I, Nance Pear, attending physician, personally viewed and interpreted this EKG  EKG Time: 1601 Rate: 101 Rhythm: sinus tachycardia Axis: normal Intervals: qtc 433 QRS: narrow ST changes: no st elevation Impression: abnormal ekg  ____________________________________________    RADIOLOGY  CXR No active cardiopulmonary disease  ____________________________________________   PROCEDURES  Procedures  ____________________________________________   INITIAL IMPRESSION / ASSESSMENT AND PLAN / ED COURSE  Pertinent labs & imaging results that were available during my care of the patient were reviewed by me and considered in my medical decision making (see chart for details).   Patient presented to the emergency department today because of concerns for palpitations.  The patient describes almost PVC sounding symptoms.  He denies any chest pain.  Blood work without concerning electrolyte abnormality.  Troponin elevation.  EKG without any concerning arrhythmia.  This time will plan on discharge and follow-up with primary care.  Did discuss Holter monitor.  ____________________________________________   FINAL CLINICAL IMPRESSION(S) / ED DIAGNOSES  Final diagnoses:  Heart palpitations     Note: This dictation was prepared with Dragon dictation. Any transcriptional errors that result from this process are unintentional     Nance Pear, MD 09/22/21 1911

## 2021-09-22 NOTE — Discharge Instructions (Addendum)
It sounds like you might be experiencing premature ventricular contractions. Please follow up with your doctor so they can order a heart monitor to better evaluate your rhythm. Please seek medical attention for any high fevers, chest pain, shortness of breath, change in behavior, persistent vomiting, bloody stool or any other new or concerning symptoms.

## 2021-09-25 ENCOUNTER — Inpatient Hospital Stay: Payer: PPO

## 2021-09-29 DIAGNOSIS — F418 Other specified anxiety disorders: Secondary | ICD-10-CM | POA: Diagnosis not present

## 2021-10-27 ENCOUNTER — Telehealth: Payer: Self-pay | Admitting: Internal Medicine

## 2021-10-27 NOTE — Telephone Encounter (Signed)
Colette called patient and r/s the apts

## 2021-10-27 NOTE — Telephone Encounter (Signed)
Pt has an appt next week that he would like to reschedule. Please give him a call at [phone]

## 2021-11-01 ENCOUNTER — Other Ambulatory Visit: Payer: PPO

## 2021-11-01 ENCOUNTER — Inpatient Hospital Stay: Payer: PPO | Admitting: Internal Medicine

## 2021-11-07 ENCOUNTER — Inpatient Hospital Stay (HOSPITAL_BASED_OUTPATIENT_CLINIC_OR_DEPARTMENT_OTHER): Payer: PPO | Admitting: Internal Medicine

## 2021-11-07 ENCOUNTER — Other Ambulatory Visit: Payer: Self-pay

## 2021-11-07 ENCOUNTER — Inpatient Hospital Stay: Payer: PPO

## 2021-11-07 ENCOUNTER — Inpatient Hospital Stay: Payer: PPO | Attending: Internal Medicine

## 2021-11-07 VITALS — BP 141/85 | HR 88 | Temp 98.1°F | Resp 17

## 2021-11-07 DIAGNOSIS — D45 Polycythemia vera: Secondary | ICD-10-CM | POA: Diagnosis not present

## 2021-11-07 DIAGNOSIS — D751 Secondary polycythemia: Secondary | ICD-10-CM

## 2021-11-07 DIAGNOSIS — R161 Splenomegaly, not elsewhere classified: Secondary | ICD-10-CM | POA: Diagnosis not present

## 2021-11-07 LAB — CBC WITH DIFFERENTIAL/PLATELET
Abs Immature Granulocytes: 0.07 10*3/uL (ref 0.00–0.07)
Basophils Absolute: 0.1 10*3/uL (ref 0.0–0.1)
Basophils Relative: 1 %
Eosinophils Absolute: 0.1 10*3/uL (ref 0.0–0.5)
Eosinophils Relative: 1 %
HCT: 45.6 % (ref 39.0–52.0)
Hemoglobin: 13.8 g/dL (ref 13.0–17.0)
Immature Granulocytes: 1 %
Lymphocytes Relative: 8 %
Lymphs Abs: 0.7 10*3/uL (ref 0.7–4.0)
MCH: 27 pg (ref 26.0–34.0)
MCHC: 30.3 g/dL (ref 30.0–36.0)
MCV: 89.1 fL (ref 80.0–100.0)
Monocytes Absolute: 0.3 10*3/uL (ref 0.1–1.0)
Monocytes Relative: 3 %
Neutro Abs: 8.2 10*3/uL — ABNORMAL HIGH (ref 1.7–7.7)
Neutrophils Relative %: 86 %
Platelets: 233 10*3/uL (ref 150–400)
RBC: 5.12 MIL/uL (ref 4.22–5.81)
RDW: 14.6 % (ref 11.5–15.5)
WBC: 9.5 10*3/uL (ref 4.0–10.5)
nRBC: 0 % (ref 0.0–0.2)

## 2021-11-07 LAB — COMPREHENSIVE METABOLIC PANEL
ALT: 16 U/L (ref 0–44)
AST: 20 U/L (ref 15–41)
Albumin: 4.2 g/dL (ref 3.5–5.0)
Alkaline Phosphatase: 106 U/L (ref 38–126)
Anion gap: 10 (ref 5–15)
BUN: 13 mg/dL (ref 8–23)
CO2: 26 mmol/L (ref 22–32)
Calcium: 8.8 mg/dL — ABNORMAL LOW (ref 8.9–10.3)
Chloride: 105 mmol/L (ref 98–111)
Creatinine, Ser: 0.92 mg/dL (ref 0.61–1.24)
GFR, Estimated: >60 mL/min (ref >60)
Glucose, Bld: 157 mg/dL — ABNORMAL HIGH (ref 70–99)
Potassium: 3.6 mmol/L (ref 3.5–5.1)
Sodium: 141 mmol/L (ref 135–145)
Total Bilirubin: 1 mg/dL (ref 0.3–1.2)
Total Protein: 7 g/dL (ref 6.5–8.1)

## 2021-11-07 LAB — LACTATE DEHYDROGENASE: LDH: 170 U/L (ref 98–192)

## 2021-11-07 NOTE — Progress Notes (Signed)
Therapeutic phlebotomy performed in right AC using 20g angiocath. 382mL removed; pt tolerated procedure well. Oral hydration provided. BP stable at discharge.

## 2021-11-07 NOTE — Assessment & Plan Note (Addendum)
#  Polycythemia vera Jak 2+; on Hydrea 500 mg twice a day; except Saturday Sunday-extra pill. STABLE:  June 2021-bone marrow biopsy persistent myeloproliferative disease; no evidence of transformation or leukemia. STABLE. [place]- April 2022-  1143 cc/ severe splenomegaly but over all STABLE. No concerns for any transformation.  We will repeat ultrasound in 3 months/every 2023.  # Today-hematocrit is 45. Proceed with phlebotomy today.  We will repeat ultrasound in next few months.  Continue Hydrea.  # Shingles vaccine/Covid vaccine: S/p Covid vaccination. S/p shingles  x1 [[given need for Jakafu in the future].  #Disposition # Phlebotomy today- on schedule for 2:30  # in 1 months-H&H; possible phlebotomy. # in 60months-H&H; possible phlebotomy. # in 3 months- MD; cbc/cmp/LDH-possible phlebotomy;US spleen prior--Dr.B

## 2021-11-07 NOTE — Progress Notes (Signed)
I connected with Derek [name] on 11/07/21 at  1:15 PM EST by video enabled telemedicine visit and verified that I am speaking with the correct person using two identifiers.  I discussed the limitations, risks, security and privacy concerns of performing an evaluation and management service by telemedicine and the availability of in-person appointments. I also discussed with the patient that there may be a patient responsible charge related to this service. The patient expressed understanding and agreed to proceed.    Other persons participating in the visit and their role in the encounter: RN/medical reconciliation Patient's location: home Provider's location: office  Oncology History Overview Note  # 2014- POLYCYTHEMIA VERA - JAK-2 Positive; on Hydrea 577m/day; aspirin 81 mg a day; phlebotomy for Hct >43 [headaches];   # MAY 2019- BMBx- pan-hypercellular; no evidence of significant fibrosis; [place] 1389 cc/ splenomegaly; Oct 11th 2019- Increase in splenomegaly; Increased Hydrea 150332mday.   # nonarteritic ischemic optic neuropathy-Left eye [may 2018[facility]    DIAGNOSIS: [ ]  POLYCYTHEMIA VERA  ;GOALS: control  CURRENT/MOST RECENT THERAPY [ ]  Hydrea        [place] ([place]   Chief Complaint:     History of present illness:Derek [name] [age].o.  male with history of polycythemia vera-on Hydrea intermittent phlebotomies here for follow-up.  Patient denies any directly disease abdominal pain or nausea vomiting.  Denies any chest pain or shortness of breath or cough.  Observation/objective: Alert & oriented x 3. In No acute distress.   Assessment and plan: Polycythemia vera ([place]# Polycythemia vera Jak 2+; on Hydrea 500 mg twice a day; except Saturday Sunday-extra pill. STABLE:  June 2021-bone marrow biopsy persistent myeloproliferative disease; no evidence of transformation or  leukemia. STABLE. [place]  1143 cc/ severe splenomegaly but over all STABLE. No concerns for any transformation.  We will repeat ultrasound in 3 months/every 2023.  # Today-hematocrit is 45. Proceed with phlebotomy today.  We will repeat ultrasound in next few months.  Continue Hydrea.  # Shingles vaccine/Covid vaccine: S/p Covid vaccination. S/p shingles  x1 [[given need for Jakafu in the future].  #Disposition # Phlebotomy today- on schedule for 2:30  # in 1 months-H&H; possible phlebotomy. # in 32m10month&H; possible phlebotomy. # in 3 months- MD; cbc/cmp/LDH-possible phlebotomy;US spleen prior--Dr.B    Follow-up instructions:  I discussed the assessment and treatment plan with the patient.  The patient was provided an opportunity to ask questions and all were answered.  The patient agreed with the plan and demonstrated understanding of instructions.  The patient was advised to call back or seek an in person evaluation if the symptoms worsen or if the condition fails to improve as anticipated.  [facility] GovCharlaine DaltonCC [facility]/22/2022 1:43 PM

## 2021-11-15 DIAGNOSIS — M5441 Lumbago with sciatica, right side: Secondary | ICD-10-CM | POA: Diagnosis not present

## 2021-11-15 DIAGNOSIS — G8929 Other chronic pain: Secondary | ICD-10-CM | POA: Diagnosis not present

## 2021-11-15 DIAGNOSIS — S32040D Wedge compression fracture of fourth lumbar vertebra, subsequent encounter for fracture with routine healing: Secondary | ICD-10-CM | POA: Diagnosis not present

## 2021-11-15 DIAGNOSIS — M545 Low back pain, unspecified: Secondary | ICD-10-CM | POA: Diagnosis not present

## 2021-12-05 ENCOUNTER — Inpatient Hospital Stay: Payer: PPO

## 2021-12-05 ENCOUNTER — Inpatient Hospital Stay: Payer: PPO | Attending: Internal Medicine

## 2021-12-05 ENCOUNTER — Other Ambulatory Visit: Payer: Self-pay

## 2021-12-05 DIAGNOSIS — D45 Polycythemia vera: Secondary | ICD-10-CM | POA: Diagnosis not present

## 2021-12-05 DIAGNOSIS — R161 Splenomegaly, not elsewhere classified: Secondary | ICD-10-CM

## 2021-12-05 LAB — HEMOGLOBIN AND HEMATOCRIT, BLOOD
HCT: 41.5 % (ref 39.0–52.0)
Hemoglobin: 12.7 g/dL — ABNORMAL LOW (ref 13.0–17.0)

## 2021-12-05 NOTE — Progress Notes (Signed)
HCT 41.5. No phlebotomy per parameters. Discussed results with patient. He denies any headaches and does not desire a phlebotomy today.

## 2021-12-25 IMAGING — US US ABDOMEN LIMITED
1 series · 10 of 10 positions shown · non-contrast
Comparison: 11/25/2019

CLINICAL DATA: Splenomegaly

EXAM:
ULTRASOUND ABDOMEN LIMITED LEFT UPPER QUADRANT

[Series 1: us abdomen limited · 0.30mm/px · 10 of 10 slices shown]
[im 1/10]
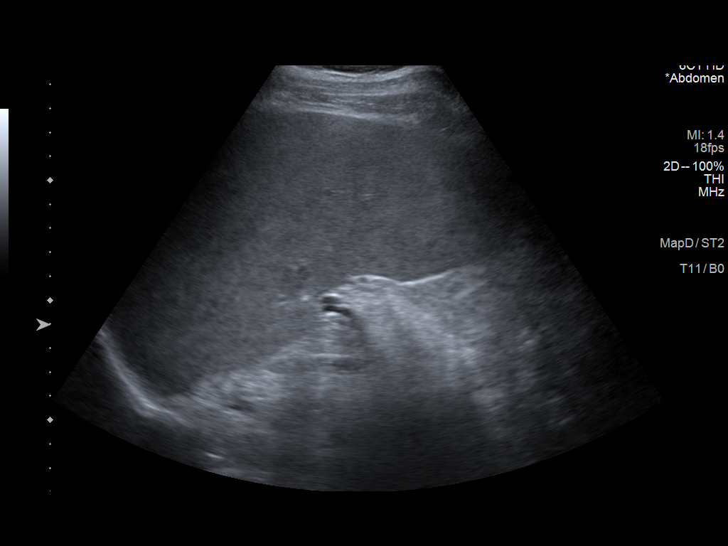
[im 2/10]
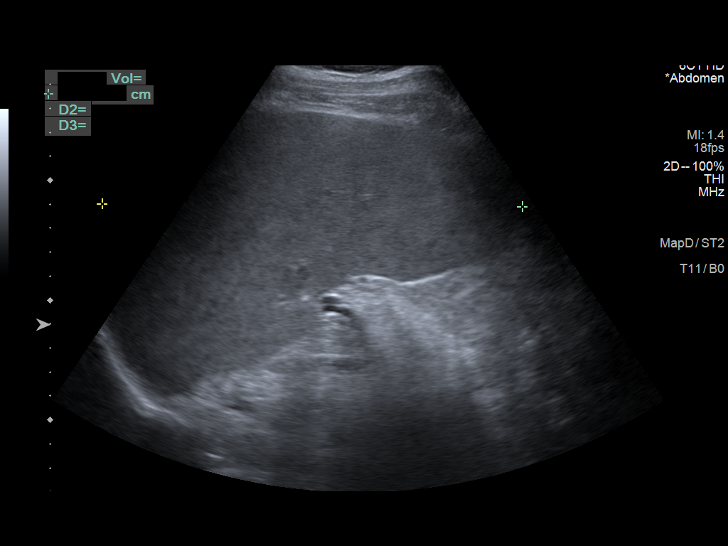
[im 3/10]
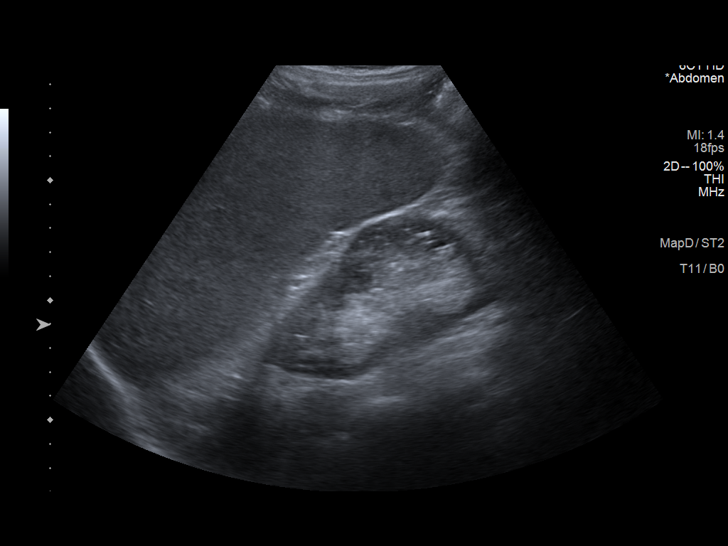
[im 4/10]
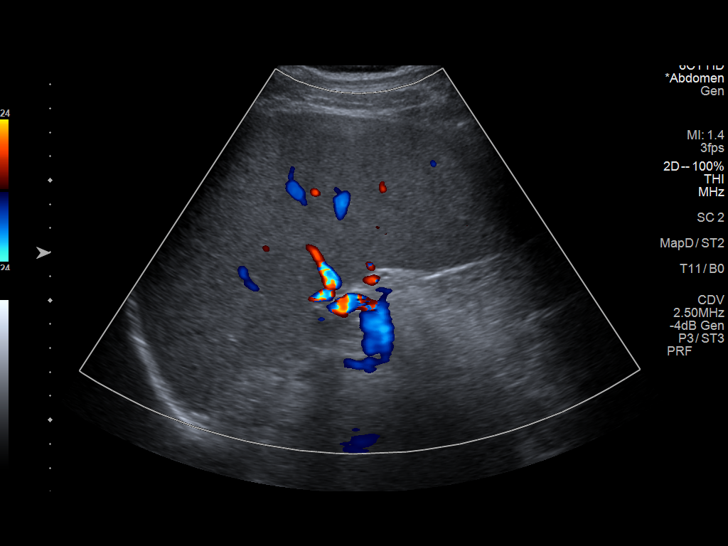
[im 5/10]
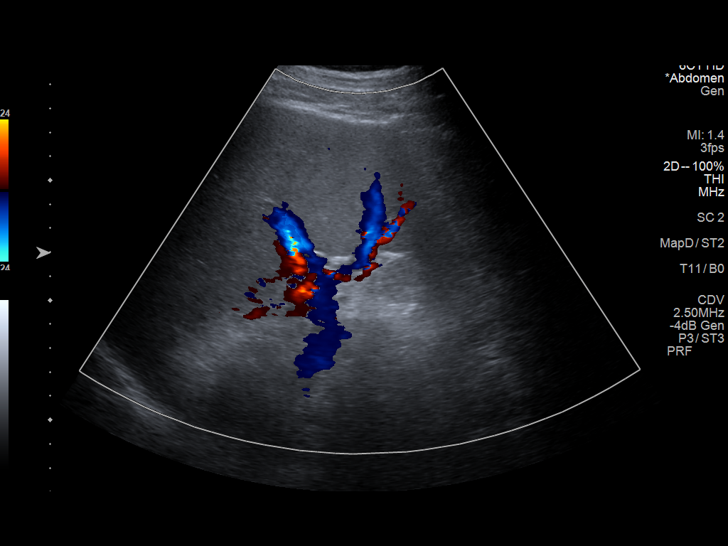
[im 6/10]
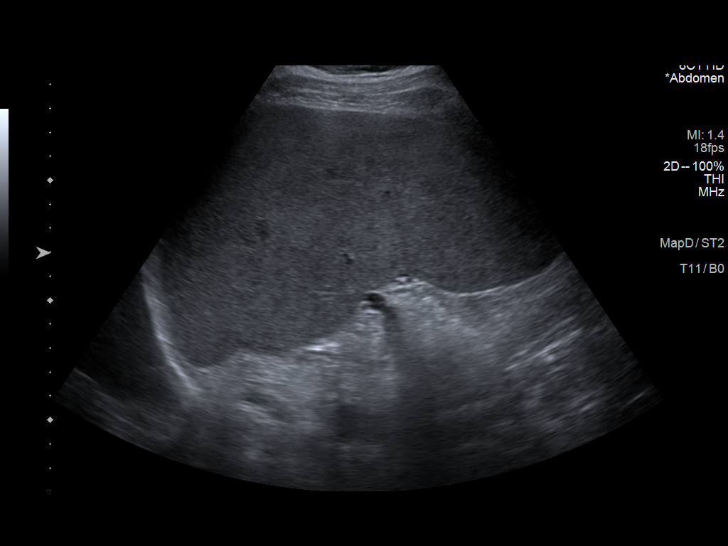
[im 7/10]
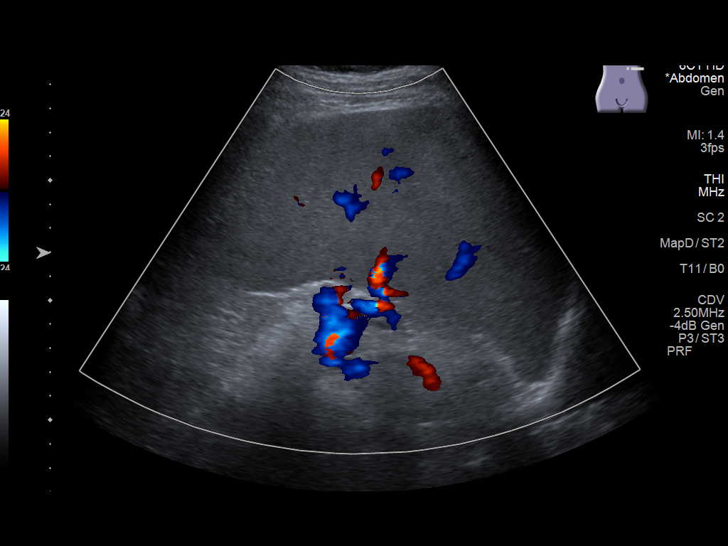
[im 8/10]
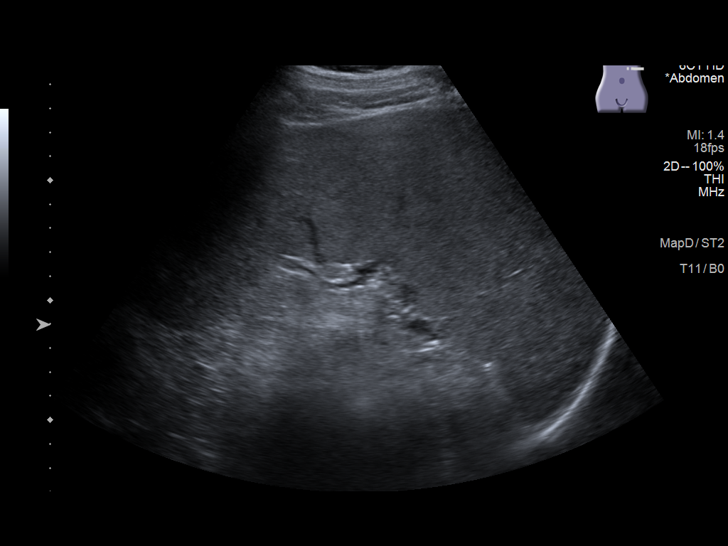
[im 9/10]
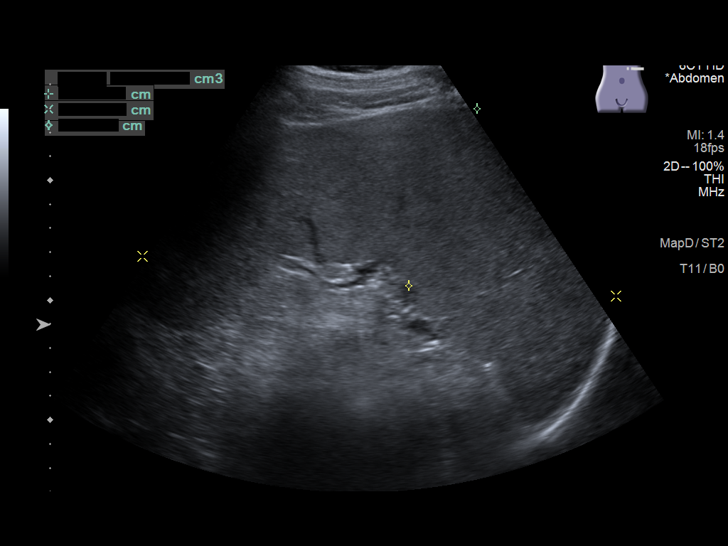
[im 10/10]
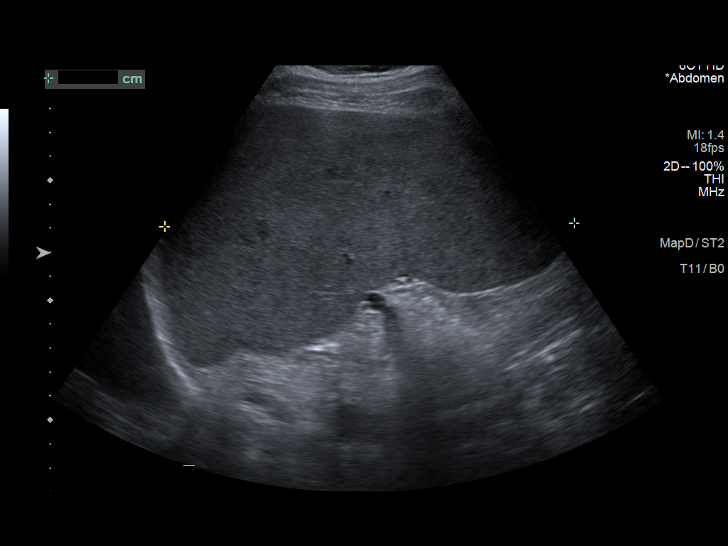

[10 of 10 positions shown; findings below may reference images not displayed]

FINDINGS: Spleen is moderately enlarged measuring approximately 17.5 cm in
length. Calculated volume of 0883 mL (previously measured
approximately 0049 mL). No focal splenic lesion. No free fluid
within the left upper quadrant.
IMPRESSION: Splenomegaly, which has slightly increased in size compared to
previous ultrasound.

## 2021-12-26 ENCOUNTER — Encounter: Payer: Self-pay | Admitting: Internal Medicine

## 2022-01-01 ENCOUNTER — Encounter: Payer: Self-pay | Admitting: Internal Medicine

## 2022-01-02 ENCOUNTER — Other Ambulatory Visit: Payer: Self-pay

## 2022-01-02 ENCOUNTER — Inpatient Hospital Stay: Payer: PPO

## 2022-01-02 ENCOUNTER — Inpatient Hospital Stay: Payer: PPO | Attending: Internal Medicine

## 2022-01-02 VITALS — BP 143/81 | HR 83 | Temp 97.9°F | Resp 18

## 2022-01-02 DIAGNOSIS — D45 Polycythemia vera: Secondary | ICD-10-CM | POA: Diagnosis not present

## 2022-01-02 DIAGNOSIS — D751 Secondary polycythemia: Secondary | ICD-10-CM

## 2022-01-02 LAB — HEMOGLOBIN: Hemoglobin: 13 g/dL (ref 13.0–17.0)

## 2022-01-02 LAB — HEMATOCRIT: HCT: 42.4 % (ref 39.0–52.0)

## 2022-01-02 NOTE — Progress Notes (Signed)
HCT 42.4 which is under 43 the parameter for phlebotomy. Pt states he would like to have phlebotomy today for headaches. Pt tolerated procedure well. VSS . Feeling well at time of discharge.

## 2022-01-02 NOTE — Patient Instructions (Signed)

## 2022-01-09 DIAGNOSIS — Z1159 Encounter for screening for other viral diseases: Secondary | ICD-10-CM | POA: Diagnosis not present

## 2022-01-09 DIAGNOSIS — I10 Essential (primary) hypertension: Secondary | ICD-10-CM | POA: Diagnosis not present

## 2022-01-09 DIAGNOSIS — E781 Pure hyperglyceridemia: Secondary | ICD-10-CM | POA: Diagnosis not present

## 2022-01-11 DIAGNOSIS — E781 Pure hyperglyceridemia: Secondary | ICD-10-CM | POA: Diagnosis not present

## 2022-01-11 DIAGNOSIS — K219 Gastro-esophageal reflux disease without esophagitis: Secondary | ICD-10-CM | POA: Diagnosis not present

## 2022-01-11 DIAGNOSIS — F418 Other specified anxiety disorders: Secondary | ICD-10-CM | POA: Diagnosis not present

## 2022-01-11 DIAGNOSIS — I10 Essential (primary) hypertension: Secondary | ICD-10-CM | POA: Diagnosis not present

## 2022-01-22 ENCOUNTER — Ambulatory Visit
Admission: RE | Admit: 2022-01-22 | Discharge: 2022-01-22 | Disposition: A | Discharge status: Home / Self Care | Payer: PPO | Source: Ambulatory Visit | Attending: Internal Medicine | Admitting: Internal Medicine

## 2022-01-22 DIAGNOSIS — R161 Splenomegaly, not elsewhere classified: Secondary | ICD-10-CM | POA: Insufficient documentation

## 2022-01-22 DIAGNOSIS — D45 Polycythemia vera: Secondary | ICD-10-CM | POA: Insufficient documentation

## 2022-01-30 ENCOUNTER — Encounter: Payer: Self-pay | Admitting: Internal Medicine

## 2022-01-30 ENCOUNTER — Inpatient Hospital Stay (HOSPITAL_BASED_OUTPATIENT_CLINIC_OR_DEPARTMENT_OTHER): Payer: PPO | Admitting: Internal Medicine

## 2022-01-30 ENCOUNTER — Inpatient Hospital Stay: Payer: PPO

## 2022-01-30 ENCOUNTER — Inpatient Hospital Stay: Payer: PPO | Attending: Internal Medicine

## 2022-01-30 ENCOUNTER — Other Ambulatory Visit: Payer: Self-pay

## 2022-01-30 VITALS — BP 132/93 | HR 83 | Temp 97.2°F | Ht 68.0 in | Wt 197.4 lb

## 2022-01-30 DIAGNOSIS — R161 Splenomegaly, not elsewhere classified: Secondary | ICD-10-CM

## 2022-01-30 DIAGNOSIS — D45 Polycythemia vera: Secondary | ICD-10-CM | POA: Insufficient documentation

## 2022-01-30 DIAGNOSIS — D471 Chronic myeloproliferative disease: Secondary | ICD-10-CM | POA: Insufficient documentation

## 2022-01-30 DIAGNOSIS — Z79899 Other long term (current) drug therapy: Secondary | ICD-10-CM | POA: Insufficient documentation

## 2022-01-30 LAB — CBC WITH DIFFERENTIAL/PLATELET
Abs Immature Granulocytes: 0.11 10*3/uL — ABNORMAL HIGH (ref 0.00–0.07)
Basophils Absolute: 0.1 10*3/uL (ref 0.0–0.1)
Basophils Relative: 1 %
Eosinophils Absolute: 0.2 10*3/uL (ref 0.0–0.5)
Eosinophils Relative: 2 %
HCT: 41.9 % (ref 39.0–52.0)
Hemoglobin: 12.6 g/dL — ABNORMAL LOW (ref 13.0–17.0)
Immature Granulocytes: 1 %
Lymphocytes Relative: 7 %
Lymphs Abs: 0.8 10*3/uL (ref 0.7–4.0)
MCH: 27.5 pg (ref 26.0–34.0)
MCHC: 30.1 g/dL (ref 30.0–36.0)
MCV: 91.3 fL (ref 80.0–100.0)
Monocytes Absolute: 0.3 10*3/uL (ref 0.1–1.0)
Monocytes Relative: 2 %
Neutro Abs: 10.6 10*3/uL — ABNORMAL HIGH (ref 1.7–7.7)
Neutrophils Relative %: 87 %
Platelets: 192 10*3/uL (ref 150–400)
RBC: 4.59 MIL/uL (ref 4.22–5.81)
RDW: 15.2 % (ref 11.5–15.5)
WBC: 12 10*3/uL — ABNORMAL HIGH (ref 4.0–10.5)
nRBC: 0 % (ref 0.0–0.2)

## 2022-01-30 LAB — COMPREHENSIVE METABOLIC PANEL
ALT: 16 U/L (ref 0–44)
AST: 20 U/L (ref 15–41)
Albumin: 4.1 g/dL (ref 3.5–5.0)
Alkaline Phosphatase: 107 U/L (ref 38–126)
Anion gap: 8 (ref 5–15)
BUN: 11 mg/dL (ref 8–23)
CO2: 26 mmol/L (ref 22–32)
Calcium: 9.3 mg/dL (ref 8.9–10.3)
Chloride: 105 mmol/L (ref 98–111)
Creatinine, Ser: 0.8 mg/dL (ref 0.61–1.24)
GFR, Estimated: >60 mL/min (ref >60)
Glucose, Bld: 112 mg/dL — ABNORMAL HIGH (ref 70–99)
Potassium: 3.9 mmol/L (ref 3.5–5.1)
Sodium: 139 mmol/L (ref 135–145)
Total Bilirubin: 0.6 mg/dL (ref 0.3–1.2)
Total Protein: 6.7 g/dL (ref 6.5–8.1)

## 2022-01-30 LAB — LACTATE DEHYDROGENASE: LDH: 171 U/L (ref 98–192)

## 2022-01-30 NOTE — Progress Notes (Signed)
Per Dr. Rogue Bussing, no phlebotomy today.

## 2022-01-30 NOTE — Progress Notes (Signed)
[place] OFFICE PROGRESS NOTE  Patient Care Team: Dion Body, MD as PCP - General (Family Medicine) Cammie Sickle, MD as Consulting Physician (Internal Medicine)   Cancer Staging  No matching staging information was found for the patient.   Oncology History Overview Note  # 2014- POLYCYTHEMIA VERA - JAK-2 Positive; on Hydrea 558m/day; aspirin 81 mg a day; phlebotomy for Hct >43 [headaches];   # MAY 2019- BMBx- pan-hypercellular; no evidence of significant fibrosis; MAY [place] 1389 cc/ splenomegaly; Oct 11th 2019- Increase in splenomegaly; Increased Hydrea 1507mday.   # nonarteritic ischemic optic neuropathy-Left eye [may 2018; [facility]    DIAGNOSIS: _0  POLYCYTHEMIA VERA  ;GOALS: control  CURRENT/MOST RECENT THERAPY _1  Hydrea        Polycythemia vera (HCC)      INTERVAL HISTORY:  Derek Kapur89.o.  male pleasant patient above history of polycythemia vera on hydrea is here for follow-up/review results of the ultrasound.  No new symptoms.  No nausea no vomiting no weight loss no night sweats. Denies any fevers.  Review of Systems  Constitutional:  Negative for chills, diaphoresis and weight loss.  HENT:  Negative for nosebleeds and sore throat.   Eyes:  Negative for double vision.  Respiratory:  Negative for cough, hemoptysis, sputum production, shortness of breath and wheezing.   Cardiovascular:  Negative for chest pain, palpitations, orthopnea and leg swelling.  Gastrointestinal:  Negative for blood in stool, constipation, diarrhea, heartburn, melena, nausea and vomiting.  Genitourinary:  Negative for dysuria, frequency and urgency.  Musculoskeletal:  Negative for back pain and joint pain.  Skin: Negative.  Negative for itching and rash.  Neurological:  Negative for dizziness, tingling, focal weakness, weakness and headaches.  Endo/Heme/Allergies:   Does not bruise/bleed easily.  Psychiatric/Behavioral:  Negative for depression. The patient is not nervous/anxious and does not have insomnia.      PAST MEDICAL HISTORY :  Past Medical History:  Diagnosis Date   Arthritis    GERD (gastroesophageal reflux disease)    Gout    Low serum testosterone level    [place]mia vera ([place]   Polycythemia, secondary 06/08/2015   Pulmonary nodules    Thoracic spine fracture (HCC)    compression fracture following a fall    PAST SURGICAL HISTORY :   Past Surgical History:  Procedure Laterality Date   COLONOSCOPY  2013    FAMILY HISTORY :   Family History  Problem Relation Age of Onset   Throat cancer Other        uncle   Migraines Neg Hx    Multiple sclerosis Neg Hx    Neurofibromatosis Neg Hx    Parkinsonism Neg Hx    Seizures Neg Hx    Neuropathy Neg Hx     SOCIAL HISTORY:   Social History   Tobacco Use   Smoking status: Never   Smokeless tobacco: Never  Vaping Use   Vaping Use: Never used  Substance Use Topics   Alcohol use: Yes    Alcohol/week: 0.0 standard drinks    Comment: occasional alcohol use   Drug use: No    ALLERGIES:  is allergic to penicillin g.  MEDICATIONS:  Current Outpatient Medications  Medication Sig Dispense Refill   acetaminophen (TYLENOL) 500 MG tablet Take 500 mg by mouth every 6 (six) hours as needed for fever.     amLODipine (NORVASC) 5 MG tablet Take 5 mg daily by mouth.  [place]  aspirin EC 81 MG tablet Take 81 mg daily by mouth.      Cholecalciferol 25 MCG (1000 UT) tablet Take 1,000 Units daily by mouth.      Esomeprazole Magnesium (NEXIUM PO) Take 2 capsules by mouth daily.      hydroxyurea (HYDREA) 500 MG capsule TAKE 1 CAPSULE BY MOUTH TWICE A DAY EXCEPT SATURDAY AND Sunday (take an extra pill on Sat/Sunday)  MAY TAKE WITH FOOD TO MINIMIZE GI SIDE EFFECTS. 210 capsule 1   ibuprofen (ADVIL) 200 MG tablet Take 800 mg by mouth once a week. For headache as needed     Multiple  Vitamins-Minerals (MULTIVITAMIN ADULT PO) Take 1 tablet by mouth 1 day or 1 dose.     econazole nitrate 1 % cream Apply topically 2 (two) times daily. (Patient not taking: Reported on 01/30/2022)     indomethacin (INDOCIN) 50 MG capsule Take by mouth. (Patient not taking: Reported on 01/30/2022)     ondansetron (ZOFRAN ODT) 4 MG disintegrating tablet Take 1 tablet (4 mg total) by mouth every 8 (eight) hours as needed for nausea or vomiting. (Patient not taking: Reported on 07/31/2021) 20 tablet 0   No current facility-administered medications for this visit.    PHYSICAL EXAMINATION: ECOG PERFORMANCE STATUS: 0 - Asymptomatic  BP (!) 132/93 (BP Location: Left Arm, Patient Position: Sitting, Cuff Size: Normal)    Pulse 83    Temp (!) 97.2 F (36.2 C) (Tympanic)    Ht _0  (1.727 m)    Wt 197 lb 6.4 oz (89.5 kg)    SpO2 99%    BMI 30.01 kg/m   Filed Weights   01/30/22 1103  Weight: 197 lb 6.4 oz (89.5 kg)    Physical Exam HENT:     Head: Normocephalic and atraumatic.     Mouth/Throat:     Pharynx: No oropharyngeal exudate.  Eyes:     Pupils: Pupils are equal, round, and reactive to light.  Cardiovascular:     Rate and Rhythm: Normal rate and regular rhythm.  Pulmonary:     Effort: No respiratory distress.     Breath sounds: No wheezing.  Abdominal:     General: Bowel sounds are normal. There is no distension.     Palpations: Abdomen is soft. There is no mass.     Tenderness: There is no abdominal tenderness. There is no guarding or rebound.     Comments: Positive for mild splenomegaly.  Musculoskeletal:        General: No tenderness. Normal range of motion.     Cervical back: Normal range of motion and neck supple.  Skin:    General: Skin is warm.  Neurological:     Mental Status: He is alert and oriented to person, place, and time.  Psychiatric:        Mood and Affect: Affect normal.    LABORATORY DATA:  I have reviewed the data as listed    Component Value Date/Time    NA 139 01/30/2022 1013   NA 140 03/02/2014 0835   K 3.9 01/30/2022 1013   K 3.8 03/02/2014 0835   CL 105 01/30/2022 1013   CL 109 (H) 03/02/2014 0835   CO2 26 01/30/2022 1013   CO2 26 03/02/2014 0835   GLUCOSE 112 (H) 01/30/2022 1013   GLUCOSE 108 (H) 03/02/2014 0835   BUN 11 01/30/2022 1013   BUN 9 03/02/2014 0835   CREATININE 0.80 01/30/2022 1013   CREATININE 0.99 03/02/2014 0835   CALCIUM  9.3 01/30/2022 1013   CALCIUM 8.6 03/02/2014 0835   PROT 6.7 01/30/2022 1013   PROT 7.0 03/02/2014 0835   ALBUMIN 4.1 01/30/2022 1013   ALBUMIN 3.7 03/02/2014 0835   AST 20 01/30/2022 1013   AST 20 03/02/2014 0835   ALT 16 01/30/2022 1013   ALT 24 03/02/2014 0835   ALKPHOS 107 01/30/2022 1013   ALKPHOS 96 03/02/2014 0835   BILITOT 0.6 01/30/2022 1013   BILITOT 0.6 03/02/2014 0835   GFRNONAA >60 01/30/2022 1013   GFRNONAA >60 03/02/2014 0835   GFRAA >60 08/15/2020 0953   GFRAA >60 03/02/2014 0835    No results found for: SPEP, UPEP  Lab Results  Component Value Date   WBC 12.0 (H) 01/30/2022   NEUTROABS 10.6 (H) 01/30/2022   HGB 12.6 (L) 01/30/2022   HCT 41.9 01/30/2022   MCV 91.3 01/30/2022   PLT 192 01/30/2022      Chemistry      Component Value Date/Time   NA 139 01/30/2022 1013   NA 140 03/02/2014 0835   K 3.9 01/30/2022 1013   K 3.8 03/02/2014 0835   CL 105 01/30/2022 1013   CL 109 (H) 03/02/2014 0835   CO2 26 01/30/2022 1013   CO2 26 03/02/2014 0835   BUN 11 01/30/2022 1013   BUN 9 03/02/2014 0835   CREATININE 0.80 01/30/2022 1013   CREATININE 0.99 03/02/2014 0835      Component Value Date/Time   CALCIUM 9.3 01/30/2022 1013   CALCIUM 8.6 03/02/2014 0835   ALKPHOS 107 01/30/2022 1013   ALKPHOS 96 03/02/2014 0835   AST 20 01/30/2022 1013   AST 20 03/02/2014 0835   ALT 16 01/30/2022 1013   ALT 24 03/02/2014 0835   BILITOT 0.6 01/30/2022 1013   BILITOT 0.6 03/02/2014 0835       RADIOGRAPHIC STUDIES: I have personally reviewed the radiological images  as listed and agreed with the findings in the report. No results found.   ASSESSMENT & PLAN:  Polycythemia vera ([place]) # Polycythemia vera Jak 2+; on Hydrea 500 mg twice a day; except Saturday Sunday-extra pill. STABLE.  June 2021-bone marrow biopsy persistent myeloproliferative disease; no evidence of transformation or leukemia. STABLE. Ultrasound spleen-measures 1300 cc [previously 1188].  However overall no significant changes in the last 3 years or so.   #Patient is asymptomatic.-Continue current dose of Hydrea.   # Today-hematocrit is 41.9 HOLD with phlebotomy today.  We will repeat ultrasound in next few months.  Continue Hydrea.  # Shingles vaccine/Covid vaccine: S/p Covid vaccination. S/p shingles  x2 [[given need for Jakafu in the future].  #Disposition # NO Phlebotomy today # in 1 months-H&H; possible phlebotomy. # in 24month-H&H; possible phlebotomy. # in 3 months- MD; cbc/cmp/LDH-possible phlebotomy;US spleen prior--Dr.B   Orders Placed This Encounter  Procedures   [place]ABDOMEN LIMITED)    Standing Status:   Future    Order Specific Question:   Reason for Exam (SYMPTOM  OR DIAGNOSIS REQUIRED)    Answer:   myelofibrosis; splenomegaly    Order Specific Question:   Preferred imaging location?    Answer:   [facility]   Hemoglobin    Standing Status:   Standing    Number of Occurrences:   2    Standing Expiration Date:   01/30/2023   Hematocrit    Standing Status:   Standing    Number of Occurrences:   2    Standing Expiration Date:   01/30/2023   CBC  with Differential/Platelet    Standing Status:   Future    Standing Expiration Date:   01/30/2023   Comprehensive metabolic panel    Standing Status:   Future    Standing Expiration Date:   01/30/2023   Lactate dehydrogenase    Standing Status:   Future    Standing Expiration Date:   01/30/2023    All questions were answered. The patient knows to call the clinic with any problems, questions or concerns.       Cammie Sickle, MD 01/30/2022 1:08 PM

## 2022-01-30 NOTE — Assessment & Plan Note (Addendum)
#  Polycythemia vera Jak 2+; on Hydrea 500 mg twice a day; except Saturday Sunday-extra pill. STABLE.  June 2021-bone marrow biopsy persistent myeloproliferative disease; no evidence of transformation or leukemia. STABLE. Ultrasound spleen-measures 1300 cc [previously 1188].  However overall no significant changes in the last 3 years or so.   #Patient is asymptomatic.-Continue current dose of Hydrea.   # Today-hematocrit is 41.9 HOLD with phlebotomy today.  We will repeat ultrasound in next few months.  Continue Hydrea.  # Shingles vaccine/Covid vaccine: S/p Covid vaccination. S/p shingles  x2 [[given need for Jakafu in the future].  #Disposition # NO Phlebotomy today # in 1 months-H&H; possible phlebotomy. # in 46months-H&H; possible phlebotomy. # in 3 months- MD; cbc/cmp/LDH-possible phlebotomy;US spleen prior--Dr.B

## 2022-02-22 ENCOUNTER — Other Ambulatory Visit: Payer: Self-pay | Admitting: Internal Medicine

## 2022-02-22 DIAGNOSIS — D45 Polycythemia vera: Secondary | ICD-10-CM

## 2022-02-22 NOTE — Telephone Encounter (Signed)
CBC with Differential ?Order: [id number] ?Status: Final result    ?Visible to patient: Yes (seen)    ?Next appt: 02/27/2022 at 01:00 PM in Oncology ([facility])    ?Dx: Splenomegaly; Polycythemia vera ([place])    ?0 Result Notes ?           ?Component Ref Range & Units 3 wk ago ?(01/30/22) 1 mo ago ?(01/02/22) 1 mo ago ?(01/02/22) 2 mo ago ?(12/05/21) 3 mo ago ?(11/07/21) 5 mo ago ?(09/22/21) 5 mo ago ?(08/28/21) 5 mo ago ?(08/28/21)  ?WBC 4.0 - 10.5 K/uL 12.0 High      9.5  11.1 High      ?RBC 4.22 - 5.81 MIL/uL 4.59     5.12  4.58     ?Hemoglobin 13.0 - 17.0 g/dL 12.6 Low    13.0 CM  12.7 Low   13.8  12.9 Low    13.0 CM   ?HCT 39.0 - 52.0 % 41.9  42.4 CM   41.5 CM  45.6  41.6  43.0 CM    ?MCV 80.0 - 100.0 fL 91.3     89.1  90.8     ?MCH 26.0 - 34.0 pg 27.5     27.0  28.2     ?MCHC 30.0 - 36.0 g/dL 30.1     30.3  31.0     ?RDW 11.5 - 15.5 % 15.2     14.6  14.9     ?Platelets 150 - 400 K/uL 192     233  219     ?nRBC 0.0 - 0.2 % 0.0     0.0  0.0 CM     ?Neutrophils Relative % % 87     86      ?Neutro Abs 1.7 - 7.7 K/uL 10.6 High      8.2 High       ?Lymphocytes Relative % 7     8      ?Lymphs Abs 0.7 - 4.0 K/uL 0.8     0.7      ?Monocytes Relative % 2     3      ?Monocytes Absolute 0.1 - 1.0 K/uL 0.3     0.3      ?Eosinophils Relative % 2     1      ?Eosinophils Absolute 0.0 - 0.5 K/uL 0.2     0.1      ?Basophils Relative % 1     1      ?Basophils Absolute 0.0 - 0.1 K/uL 0.1     0.1      ?Immature Granulocytes % 1     1      ?Abs Immature Granulocytes 0.00 - 0.07 K/uL 0.11 High      0.07 CM      ?Comment: Performed at [facility], [address] [place], [place] [address]  ?Resulting Agency  [facility] [place] [facility] [facility] [facility] [place] [facility] [place] [facility] [place] [facility] [facility]  ?  ? ?  ?  ?Specimen Collected: 01/30/22 10:13 Last Resulted: 01/30/22 10:45  ?  ?  Lab Flowsheet   ? Order Details   ? View Encounter   ? Lab and Collection Details   ? Routing   ? Result History    ?View Encounter Conversation    ?   ?CM=Additional comments    ?  ?Result Care Coordination ? ? ?Patient Communication ? ? Add Comments  Seen Back to Top  ?  ?  ? ?Other Results from 01/30/2022 ? ?Lactate dehydrogenase ?Order: [id number] ?Status: Final result    ?Visible to patient: Yes (seen)    ?Next appt: 02/27/2022 at 01:00 PM in Oncology ([facility])    ?Dx: Splenomegaly; Polycythemia vera ([place])    ?0 Result Notes ?          ?Component Ref Range & Units 3 wk ago ?(01/30/22) 3 mo ago ?(11/07/21) 6 mo ago ?(07/31/21) 11 mo ago ?(03/27/21) 1 yr ago ?(11/28/20) 1 yr ago ?(08/15/20) 1 yr ago ?(06/29/20)  ?LDH 98 - 192 U/L 171  170 CM  201 High  CM  170 CM  152 CM  143 CM  165 CM   ?Comment: Performed at [facility], [address], [place], [place] [address]  ?Resulting Agency  [place] [facility] [place] [facility] [place] [facility] [place] [facility] [place] [facility] [place] [facility] [place] [facility]  ?  ? ?  ?  ?Specimen Collected: 01/30/22 10:13 Last Resulted: 01/30/22 10:48  ?  ?  Lab Flowsheet   ? Order Details   ? View Encounter   ? Lab and Collection Details   ? Routing   ? Result History    ?View Encounter Conversation    ?  ?CM=Additional comments    ?  ?Result Care Coordination ? ? ?Patient Communication ? ? Add Comments   Seen Back to Top  ?  ?  ? ?  ? Contains abnormal data Comprehensive metabolic panel ?Order: [id number] ?Status: Final result    ?Visible to patient: Yes (seen)    ?Next appt: 02/27/2022 at 01:00 PM in Oncology ([facility])    ?Dx: Splenomegaly; Polycythemia vera ([place])    ?0 Result Notes ?          ?Component Ref Range & Units 3 wk ago ?(01/30/22) 3 mo ago ?(11/07/21) 5 mo ago ?(09/22/21) 6 mo ago ?(07/31/21) 11 mo ago ?(03/27/21) 1 yr ago ?(12/10/20) 1 yr ago ?(11/28/20)  ?Sodium 135 - 145 mmol/L 139  141  141  138  141  141  141   ?Potassium 3.5 - 5.1 mmol/L 3.9  3.6  4.2  3.8  3.8  3.9  4.3   ?Chloride 98 - 111 mmol/L 105  105  103  104  106  105  104   ?CO2 22 - 32 mmol/L '26  26  27  25  26  26  28   '$ ?Glucose, Bld 70 - 99 mg/dL 112 High   157 High  CM  93 CM   97 CM  128 High  CM  150 High  CM  112 High  CM   ?Comment: Glucose reference range applies only to samples taken after fasting for at least 8 hours.  ?BUN 8 - 23 mg/dL '11  13  9  11  10  10  12   '$ ?Creatinine, Ser 0.61 - 1.24 mg/dL 0.80  0.92  0.90  0.81  0.83  0.87  0.89   ?Calcium 8.9 - 10.3 mg/dL 9.3  8.8 Low   8.8 Low   8.7 Low   8.8 Low   8.9  9.1   ?Total Protein 6.5 - 8.1 g/dL 6.7  7.0   6.8  6.7   6.9   ?Albumin 3.5 - 5.0 g/dL 4.1  4.2   4.2  4.2   4.2   ?AST 15 - 41 U/L 20  20  $'23  22   17   'c$ ?ALT 0 - 44 U/L '16  16   20  18   14   '$ ?Alkaline Phosphatase 38 - 126 U/L 107  106   101  89   89   ?Total Bilirubin 0.3 - 1.2 mg/dL 0.6  1.0   1.0  0.8   0.9   ?GFR, Estimated >60 mL/min >60  >60 CM  >60 CM  >60 CM  >60 CM  >60 CM  >60 CM   ?Comment: (NOTE)  ?Calculated using the CKD-EPI Creatinine Equation (2021)   ?Anion gap 5 - '15 8  10 '$ CM  11 CM  9 CM  9 CM  10 CM  9 CM   ?Comment: Performed at [facility], [address] [place], [place] [address]  ?Resulting Agency  [place] [facility] [place] [facility] [place] [facility] [place] [facility] [place] [facility] [place] [facility] [place] [facility]  ?  ? ?  ?  ?Specimen Collected: 01/30/22 10:13 Last Resulted: 01/30/22 10:48  ?  ?   ?  ?  ? ?

## 2022-02-23 ENCOUNTER — Encounter: Payer: Self-pay | Admitting: Internal Medicine

## 2022-02-27 ENCOUNTER — Inpatient Hospital Stay: Payer: PPO

## 2022-02-27 ENCOUNTER — Inpatient Hospital Stay: Payer: PPO | Attending: Internal Medicine

## 2022-02-27 ENCOUNTER — Other Ambulatory Visit: Payer: Self-pay

## 2022-02-27 DIAGNOSIS — R161 Splenomegaly, not elsewhere classified: Secondary | ICD-10-CM

## 2022-02-27 DIAGNOSIS — D45 Polycythemia vera: Secondary | ICD-10-CM | POA: Diagnosis not present

## 2022-02-27 LAB — HEMOGLOBIN AND HEMATOCRIT, BLOOD
HCT: 39.8 % (ref 39.0–52.0)
Hemoglobin: 12.1 g/dL — ABNORMAL LOW (ref 13.0–17.0)

## 2022-02-27 NOTE — Progress Notes (Signed)
Phlebotomy not needed today. Labs within parameters to hold phleb. Pt informed, he denies headache today.  ?

## 2022-03-22 ENCOUNTER — Telehealth: Payer: Self-pay | Admitting: *Deleted

## 2022-03-22 NOTE — Telephone Encounter (Signed)
Patient called stating that the time for his appointment 03/27/22@ 230 does not work for him an dis asking if there is any way he can come between 53 and 12. If not then he needs to be rescheduled for another day ?

## 2022-03-23 ENCOUNTER — Encounter: Payer: Self-pay | Admitting: Internal Medicine

## 2022-03-27 ENCOUNTER — Inpatient Hospital Stay: Payer: PPO

## 2022-03-29 ENCOUNTER — Inpatient Hospital Stay: Payer: PPO

## 2022-03-29 ENCOUNTER — Inpatient Hospital Stay: Payer: PPO | Attending: Internal Medicine

## 2022-03-29 DIAGNOSIS — R161 Splenomegaly, not elsewhere classified: Secondary | ICD-10-CM

## 2022-03-29 DIAGNOSIS — D45 Polycythemia vera: Secondary | ICD-10-CM | POA: Insufficient documentation

## 2022-03-29 LAB — HEMATOCRIT: HCT: 41.7 % (ref 39.0–52.0)

## 2022-03-29 LAB — HEMOGLOBIN: Hemoglobin: 12.4 g/dL — ABNORMAL LOW (ref 13.0–17.0)

## 2022-03-29 NOTE — Progress Notes (Signed)
Therapeutic phlebotomy not indicated today, per treatment parameters. Pt informed.  ?

## 2022-04-05 DIAGNOSIS — D0439 Carcinoma in situ of skin of other parts of face: Secondary | ICD-10-CM | POA: Diagnosis not present

## 2022-04-05 DIAGNOSIS — D485 Neoplasm of uncertain behavior of skin: Secondary | ICD-10-CM | POA: Diagnosis not present

## 2022-04-26 ENCOUNTER — Telehealth: Payer: Self-pay | Admitting: *Deleted

## 2022-04-26 NOTE — Telephone Encounter (Signed)
Patient called asking to change his appointment back to Tuesday at 2 PM because he is developing a headache ?

## 2022-04-27 ENCOUNTER — Ambulatory Visit
Admission: RE | Admit: 2022-04-27 | Discharge: 2022-04-27 | Disposition: A | Discharge status: Home / Self Care | Payer: PPO | Source: Ambulatory Visit | Attending: Internal Medicine | Admitting: Internal Medicine

## 2022-04-27 DIAGNOSIS — R161 Splenomegaly, not elsewhere classified: Secondary | ICD-10-CM | POA: Insufficient documentation

## 2022-04-27 DIAGNOSIS — D45 Polycythemia vera: Secondary | ICD-10-CM | POA: Insufficient documentation

## 2022-05-01 ENCOUNTER — Inpatient Hospital Stay: Payer: PPO | Admitting: Internal Medicine

## 2022-05-01 ENCOUNTER — Inpatient Hospital Stay: Payer: PPO

## 2022-05-01 ENCOUNTER — Inpatient Hospital Stay: Payer: PPO | Attending: Internal Medicine

## 2022-05-01 ENCOUNTER — Encounter: Payer: Self-pay | Admitting: Internal Medicine

## 2022-05-01 VITALS — BP 129/80 | HR 88

## 2022-05-01 DIAGNOSIS — D471 Chronic myeloproliferative disease: Secondary | ICD-10-CM | POA: Diagnosis not present

## 2022-05-01 DIAGNOSIS — D751 Secondary polycythemia: Secondary | ICD-10-CM

## 2022-05-01 DIAGNOSIS — D45 Polycythemia vera: Secondary | ICD-10-CM

## 2022-05-01 DIAGNOSIS — R161 Splenomegaly, not elsewhere classified: Secondary | ICD-10-CM

## 2022-05-01 DIAGNOSIS — Z79899 Other long term (current) drug therapy: Secondary | ICD-10-CM | POA: Insufficient documentation

## 2022-05-01 LAB — CBC WITH DIFFERENTIAL/PLATELET
Abs Immature Granulocytes: 0.08 10*3/uL — ABNORMAL HIGH (ref 0.00–0.07)
Basophils Absolute: 0.1 10*3/uL (ref 0.0–0.1)
Basophils Relative: 1 %
Eosinophils Absolute: 0.1 10*3/uL (ref 0.0–0.5)
Eosinophils Relative: 1 %
HCT: 42.3 % (ref 39.0–52.0)
Hemoglobin: 13.2 g/dL (ref 13.0–17.0)
Immature Granulocytes: 1 %
Lymphocytes Relative: 8 %
Lymphs Abs: 0.9 10*3/uL (ref 0.7–4.0)
MCH: 28.1 pg (ref 26.0–34.0)
MCHC: 31.2 g/dL (ref 30.0–36.0)
MCV: 90 fL (ref 80.0–100.0)
Monocytes Absolute: 0.3 10*3/uL (ref 0.1–1.0)
Monocytes Relative: 3 %
Neutro Abs: 9.1 10*3/uL — ABNORMAL HIGH (ref 1.7–7.7)
Neutrophils Relative %: 86 %
Platelets: 176 10*3/uL (ref 150–400)
RBC: 4.7 MIL/uL (ref 4.22–5.81)
RDW: 15.2 % (ref 11.5–15.5)
WBC: 10.5 10*3/uL (ref 4.0–10.5)
nRBC: 0 % (ref 0.0–0.2)

## 2022-05-01 LAB — COMPREHENSIVE METABOLIC PANEL
ALT: 16 U/L (ref 0–44)
AST: 22 U/L (ref 15–41)
Albumin: 4.1 g/dL (ref 3.5–5.0)
Alkaline Phosphatase: 95 U/L (ref 38–126)
Anion gap: 9 (ref 5–15)
BUN: 10 mg/dL (ref 8–23)
CO2: 25 mmol/L (ref 22–32)
Calcium: 8.6 mg/dL — ABNORMAL LOW (ref 8.9–10.3)
Chloride: 104 mmol/L (ref 98–111)
Creatinine, Ser: 0.98 mg/dL (ref 0.61–1.24)
GFR, Estimated: >60 mL/min (ref >60)
Glucose, Bld: 125 mg/dL — ABNORMAL HIGH (ref 70–99)
Potassium: 3.9 mmol/L (ref 3.5–5.1)
Sodium: 138 mmol/L (ref 135–145)
Total Bilirubin: 0.6 mg/dL (ref 0.3–1.2)
Total Protein: 6.7 g/dL (ref 6.5–8.1)

## 2022-05-01 LAB — LACTATE DEHYDROGENASE: LDH: 169 U/L (ref 98–192)

## 2022-05-01 NOTE — Assessment & Plan Note (Addendum)
#  Polycythemia vera Jak 2+; on Hydrea 500 mg twice a day; except Saturday Sunday-extra pill. STABLE.  June 2021-bone marrow biopsy persistent myeloproliferative disease; no evidence of transformation or leukemia. MAY 2023-ultrasound: Splenomegaly with a volume 1097 mL, compared to 1319 prior study from 01/22/2022. ? ?#Patient patient continues to be asymptomatic [except for visual symptoms-see below].-Continue current dose of Hydrea.  ? ?# Today-hematocrit is  42.3; Proceed with  phlebotomy today. Continue Hydrea. ? ?#Visual symptoms/blurriness episodic-no concerns for any increased cranial pressure.  Recommend evaluation with [facility]. ? ?#Disposition ?# referral to [facility] re: blurry vision ?#  Phlebotomy today ?# in 1 months-H&H; possible phlebotomy. ?# in 34months-H&H; possible phlebotomy. ?# in 3 months- MD; cbc/cmp/LDH-possible phlebotomy;---Dr.B ?

## 2022-05-01 NOTE — Progress Notes (Signed)
[place] ?OFFICE PROGRESS NOTE ? ?Patient Care Team: ?Dion Body, MD as PCP - General (Family Medicine) ?Cammie Sickle, MD as Consulting Physician (Internal Medicine) ? ? Cancer Staging  ?No matching staging information was found for the patient. ? ? ?Oncology History Overview Note  ?# 2014- POLYCYTHEMIA VERA - JAK-2 Positive; on Hydrea '500mg'$ /day; aspirin 81 mg a day; phlebotomy for Hct >43 [headaches];  ? ?# MAY 2019- BMBx- pan-hypercellular; no evidence of significant fibrosis; MAY [place]- 1389 cc/ splenomegaly; Oct 11th 2019- Increase in splenomegaly; Increased Hydrea '1500mg'$ /day.  ? ?# nonarteritic ischemic optic neuropathy-Left eye [may 2018; Dr.Dingledein/Dr.Sitko at [facility]; Previous Right eye ?---------------------------------------   ? ?DIAGNOSIS: '[ ]'$  POLYCYTHEMIA VERA ? ?;GOALS: control ? ?CURRENT/MOST RECENT THERAPY '[ ]'$  Hydrea ? ? ? ? ? ?  ?Polycythemia vera ([place])  ? ?  ? ?INTERVAL HISTORY: ? ?Derek [name] 66 y.o.  male pleasant patient above history of polycythemia vera on hydrea is here for follow-up. ? ?For the past couple of days he is having headaches.  Has noticed visual changes while looking at computer screen for about a week. No seen opthalamgist.  ? ?No new symptoms.  No nausea no vomiting no weight loss no night sweats. Denies any fevers.  Patient admits compliance to Hydrea. ? ?Review of Systems  ?Constitutional:  Negative for chills, diaphoresis and weight loss.  ?HENT:  Negative for nosebleeds and sore throat.   ?Eyes:  Negative for double vision.  ?Respiratory:  Negative for cough, hemoptysis, sputum production, shortness of breath and wheezing.   ?Cardiovascular:  Negative for chest pain, palpitations, orthopnea and leg swelling.  ?Gastrointestinal:  Negative for blood in stool, constipation, diarrhea, heartburn, melena, nausea and vomiting.  ?Genitourinary:  Negative for dysuria, frequency and urgency.  ?Musculoskeletal:  Negative for back pain and joint pain.   ?Skin: Negative.  Negative for itching and rash.  ?Neurological:  Negative for dizziness, tingling, focal weakness, weakness and headaches.  ?Endo/Heme/Allergies:  Does not bruise/bleed easily.  ?Psychiatric/Behavioral:  Negative for depression. The patient is not nervous/anxious and does not have insomnia.   ?  ? ?PAST MEDICAL HISTORY :  ?Past Medical History:  ?Diagnosis Date  ? Arthritis   ? GERD (gastroesophageal reflux disease)   ? Gout   ? Low serum testosterone level   ? Polycythemia vera ([place])   ? Polycythemia, secondary 06/08/2015  ? Pulmonary nodules   ? Thoracic spine fracture ([place])   ? compression fracture following a fall  ? ? ?PAST SURGICAL HISTORY :   ?Past Surgical History:  ?Procedure Laterality Date  ? COLONOSCOPY  2013  ? ? ?FAMILY HISTORY :   ?Family History  ?Problem Relation Age of Onset  ? Throat cancer Other   ?     uncle  ? Migraines Neg Hx   ? Multiple sclerosis Neg Hx   ? Neurofibromatosis Neg Hx   ? Parkinsonism Neg Hx   ? Seizures Neg Hx   ? Neuropathy Neg Hx   ? ? ?SOCIAL HISTORY:   ?Social History  ? ?Tobacco Use  ? Smoking status: Never  ? Smokeless tobacco: Never  ?Vaping Use  ? Vaping Use: Never used  ?Substance Use Topics  ? Alcohol use: Yes  ?  Alcohol/week: 0.0 standard drinks  ?  Comment: occasional alcohol use  ? Drug use: No  ? ? ?ALLERGIES:  is allergic to penicillin g. ? ?MEDICATIONS:  ?Current Outpatient Medications  ?Medication Sig Dispense Refill  ? acetaminophen (TYLENOL) 500 MG  tablet Take 500 mg by mouth every 6 (six) hours as needed for fever.    ? amLODipine (NORVASC) 5 MG tablet Take 5 mg daily by mouth.  11  ? aspirin EC 81 MG tablet Take 81 mg daily by mouth.     ? Cholecalciferol 25 MCG (1000 UT) tablet Take 1,000 Units daily by mouth.     ? Esomeprazole Magnesium (NEXIUM PO) Take 2 capsules by mouth daily.     ? hydroxyurea (HYDREA) 500 MG capsule TAKE 1 CAPSULE BY MOUTH TWICE A DAY EXCEPT SATURDAY AND SUNDAY (TAKE AN EXTRA PILL ON SAT/SUNDAY) MAY TAKE WITH FOOD  TO MINIMIZE GI SIDE EFFECTS. 210 capsule 1  ? ibuprofen (ADVIL) 200 MG tablet Take 800 mg by mouth once a week. For headache as needed    ? Multiple Vitamins-Minerals (MULTIVITAMIN ADULT PO) Take 1 tablet by mouth 1 day or 1 dose.    ? econazole nitrate 1 % cream Apply topically 2 (two) times daily. (Patient not taking: Reported on 01/30/2022)    ? indomethacin (INDOCIN) 50 MG capsule Take by mouth. (Patient not taking: Reported on 01/30/2022)    ? ondansetron (ZOFRAN ODT) 4 MG disintegrating tablet Take 1 tablet (4 mg total) by mouth every 8 (eight) hours as needed for nausea or vomiting. (Patient not taking: Reported on 07/31/2021) 20 tablet 0  ? ?No current facility-administered medications for this visit.  ? ? ?PHYSICAL EXAMINATION: ?ECOG PERFORMANCE STATUS: 0 - Asymptomatic ? ?BP 132/77   Pulse 91   Temp 99.3 ?F (37.4 ?C)   Resp 18   Ht '5\' 8"'$  (1.727 m)   Wt 197 lb 6.4 oz (89.5 kg)   BMI 30.01 kg/m?  ? ?Filed Weights  ? 05/01/22 1400  ?Weight: 197 lb 6.4 oz (89.5 kg)  ? ? ?Physical Exam ?HENT:  ?   Head: Normocephalic and atraumatic.  ?   Mouth/Throat:  ?   Pharynx: No oropharyngeal exudate.  ?Eyes:  ?   Pupils: Pupils are equal, round, and reactive to light.  ?Cardiovascular:  ?   Rate and Rhythm: Normal rate and regular rhythm.  ?Pulmonary:  ?   Effort: No respiratory distress.  ?   Breath sounds: No wheezing.  ?Abdominal:  ?   General: Bowel sounds are normal. There is no distension.  ?   Palpations: Abdomen is soft. There is no mass.  ?   Tenderness: There is no abdominal tenderness. There is no guarding or rebound.  ?   Comments: Positive for mild splenomegaly.  ?Musculoskeletal:     ?   General: No tenderness. Normal range of motion.  ?   Cervical back: Normal range of motion and neck supple.  ?Skin: ?   General: Skin is warm.  ?Neurological:  ?   Mental Status: He is alert and oriented to person, place, and time.  ?Psychiatric:     ?   Mood and Affect: Affect normal.  ? ? ?LABORATORY DATA:  ?I have  reviewed the data as listed ?   ?Component Value Date/Time  ? NA 138 05/01/2022 1403  ? NA 140 03/02/2014 0835  ? K 3.9 05/01/2022 1403  ? K 3.8 03/02/2014 0835  ? CL 104 05/01/2022 1403  ? CL 109 (H) 03/02/2014 0835  ? CO2 25 05/01/2022 1403  ? CO2 26 03/02/2014 0835  ? GLUCOSE 125 (H) 05/01/2022 1403  ? GLUCOSE 108 (H) 03/02/2014 0835  ? BUN 10 05/01/2022 1403  ? BUN 9 03/02/2014 0835  ?  CREATININE 0.98 05/01/2022 1403  ? CREATININE 0.99 03/02/2014 0835  ? CALCIUM 8.6 (L) 05/01/2022 1403  ? CALCIUM 8.6 03/02/2014 0835  ? PROT 6.7 05/01/2022 1403  ? PROT 7.0 03/02/2014 0835  ? ALBUMIN 4.1 05/01/2022 1403  ? ALBUMIN 3.7 03/02/2014 0835  ? AST 22 05/01/2022 1403  ? AST 20 03/02/2014 0835  ? ALT 16 05/01/2022 1403  ? ALT 24 03/02/2014 0835  ? ALKPHOS 95 05/01/2022 1403  ? ALKPHOS 96 03/02/2014 0835  ? BILITOT 0.6 05/01/2022 1403  ? BILITOT 0.6 03/02/2014 0835  ? GFRNONAA >60 05/01/2022 1403  ? GFRNONAA >60 03/02/2014 0835  ? GFRAA >60 08/15/2020 0953  ? GFRAA >60 03/02/2014 0835  ? ? ?No results found for: SPEP, UPEP ? ?Lab Results  ?Component Value Date  ? WBC 10.5 05/01/2022  ? NEUTROABS 9.1 (H) 05/01/2022  ? HGB 13.2 05/01/2022  ? HCT 42.3 05/01/2022  ? MCV 90.0 05/01/2022  ? PLT 176 05/01/2022  ? ? ?  Chemistry   ?   ?Component Value Date/Time  ? NA 138 05/01/2022 1403  ? NA 140 03/02/2014 0835  ? K 3.9 05/01/2022 1403  ? K 3.8 03/02/2014 0835  ? CL 104 05/01/2022 1403  ? CL 109 (H) 03/02/2014 0835  ? CO2 25 05/01/2022 1403  ? CO2 26 03/02/2014 0835  ? BUN 10 05/01/2022 1403  ? BUN 9 03/02/2014 0835  ? CREATININE 0.98 05/01/2022 1403  ? CREATININE 0.99 03/02/2014 0835  ?    ?Component Value Date/Time  ? CALCIUM 8.6 (L) 05/01/2022 1403  ? CALCIUM 8.6 03/02/2014 0835  ? ALKPHOS 95 05/01/2022 1403  ? ALKPHOS 96 03/02/2014 0835  ? AST 22 05/01/2022 1403  ? AST 20 03/02/2014 0835  ? ALT 16 05/01/2022 1403  ? ALT 24 03/02/2014 0835  ? BILITOT 0.6 05/01/2022 1403  ? BILITOT 0.6 03/02/2014 0835  ?  ? ? ? ?RADIOGRAPHIC  STUDIES: ?I have personally reviewed the radiological images as listed and agreed with the findings in the report. ?No results found.  ? ?ASSESSMENT & PLAN:  ?Polycythemia vera ([place]) ?# Polycythemia vera Jak 2+; on

## 2022-05-01 NOTE — Patient Instructions (Signed)

## 2022-05-01 NOTE — Progress Notes (Signed)
For the past couple of days he is having headaches.  Has noticed visual changes while looking at computer screen for about a week. ?

## 2022-05-01 NOTE — Progress Notes (Signed)
Pt had therapeutic phlebotomy today. 350 ml of blood removed. Pt accepted a beverage.VSS. ?

## 2022-05-04 ENCOUNTER — Other Ambulatory Visit: Payer: PPO

## 2022-05-04 ENCOUNTER — Ambulatory Visit: Payer: PPO | Admitting: Internal Medicine

## 2022-05-08 ENCOUNTER — Other Ambulatory Visit
Admission: RE | Admit: 2022-05-08 | Discharge: 2022-05-08 | Disposition: A | Discharge status: Home / Self Care | Payer: PPO | Source: Ambulatory Visit | Attending: Ophthalmology | Admitting: Ophthalmology

## 2022-05-08 DIAGNOSIS — G453 Amaurosis fugax: Secondary | ICD-10-CM | POA: Diagnosis not present

## 2022-05-08 DIAGNOSIS — H5710 Ocular pain, unspecified eye: Secondary | ICD-10-CM | POA: Insufficient documentation

## 2022-05-08 LAB — CBC WITH DIFFERENTIAL/PLATELET
Abs Immature Granulocytes: 0.15 10*3/uL — ABNORMAL HIGH (ref 0.00–0.07)
Basophils Absolute: 0.1 10*3/uL (ref 0.0–0.1)
Basophils Relative: 1 %
Eosinophils Absolute: 0.2 10*3/uL (ref 0.0–0.5)
Eosinophils Relative: 1 %
HCT: 40.9 % (ref 39.0–52.0)
Hemoglobin: 12.1 g/dL — ABNORMAL LOW (ref 13.0–17.0)
Immature Granulocytes: 1 %
Lymphocytes Relative: 8 %
Lymphs Abs: 1 10*3/uL (ref 0.7–4.0)
MCH: 26.9 pg (ref 26.0–34.0)
MCHC: 29.6 g/dL — ABNORMAL LOW (ref 30.0–36.0)
MCV: 90.9 fL (ref 80.0–100.0)
Monocytes Absolute: 0.3 10*3/uL (ref 0.1–1.0)
Monocytes Relative: 2 %
Neutro Abs: 11 10*3/uL — ABNORMAL HIGH (ref 1.7–7.7)
Neutrophils Relative %: 87 %
Platelets: 236 10*3/uL (ref 150–400)
RBC: 4.5 MIL/uL (ref 4.22–5.81)
RDW: 15.2 % (ref 11.5–15.5)
WBC: 12.6 10*3/uL — ABNORMAL HIGH (ref 4.0–10.5)
nRBC: 0 % (ref 0.0–0.2)

## 2022-05-08 LAB — C-REACTIVE PROTEIN: CRP: 1.2 mg/dL — ABNORMAL HIGH (ref <1.0)

## 2022-05-08 LAB — SEDIMENTATION RATE: Sed Rate: 3 mm/hr (ref 0–20)

## 2022-05-25 ENCOUNTER — Other Ambulatory Visit: Payer: Self-pay | Admitting: Ophthalmology

## 2022-05-25 DIAGNOSIS — G453 Amaurosis fugax: Secondary | ICD-10-CM

## 2022-05-25 DIAGNOSIS — H5710 Ocular pain, unspecified eye: Secondary | ICD-10-CM

## 2022-06-01 ENCOUNTER — Inpatient Hospital Stay: Payer: PPO

## 2022-06-02 ENCOUNTER — Ambulatory Visit
Admission: RE | Admit: 2022-06-02 | Discharge: 2022-06-02 | Disposition: A | Discharge status: Home / Self Care | Payer: PPO | Source: Ambulatory Visit | Attending: Ophthalmology | Admitting: Ophthalmology

## 2022-06-02 DIAGNOSIS — R519 Headache, unspecified: Secondary | ICD-10-CM | POA: Diagnosis not present

## 2022-06-02 DIAGNOSIS — R251 Tremor, unspecified: Secondary | ICD-10-CM | POA: Diagnosis not present

## 2022-06-02 DIAGNOSIS — G453 Amaurosis fugax: Secondary | ICD-10-CM | POA: Insufficient documentation

## 2022-06-02 DIAGNOSIS — H5710 Ocular pain, unspecified eye: Secondary | ICD-10-CM | POA: Insufficient documentation

## 2022-06-02 MED ORDER — GADOBUTROL 1 MMOL/ML IV SOLN
9.0000 mL | Freq: Once | INTRAVENOUS | Status: AC | PRN
  Administered 2022-06-02: 10 mL via INTRAVENOUS

## 2022-06-06 ENCOUNTER — Inpatient Hospital Stay: Payer: PPO | Attending: Internal Medicine

## 2022-06-06 ENCOUNTER — Inpatient Hospital Stay: Payer: PPO

## 2022-06-06 DIAGNOSIS — D45 Polycythemia vera: Secondary | ICD-10-CM | POA: Diagnosis not present

## 2022-06-06 DIAGNOSIS — R161 Splenomegaly, not elsewhere classified: Secondary | ICD-10-CM

## 2022-06-06 LAB — HEMOGLOBIN: Hemoglobin: 12.6 g/dL — ABNORMAL LOW (ref 13.0–17.0)

## 2022-06-06 LAB — HEMATOCRIT: HCT: 41.1 % (ref 39.0–52.0)

## 2022-06-06 NOTE — Progress Notes (Signed)
HCT 41.1. Pt states he is feeling well. Denies any headaches. No phlebotomy required today.

## 2022-06-11 DIAGNOSIS — L905 Scar conditions and fibrosis of skin: Secondary | ICD-10-CM | POA: Diagnosis not present

## 2022-06-11 DIAGNOSIS — C44329 Squamous cell carcinoma of skin of other parts of face: Secondary | ICD-10-CM | POA: Diagnosis not present

## 2022-06-27 ENCOUNTER — Inpatient Hospital Stay: Payer: PPO | Attending: Internal Medicine

## 2022-06-27 ENCOUNTER — Inpatient Hospital Stay: Payer: PPO

## 2022-06-27 VITALS — BP 117/87 | HR 98 | Temp 98.5°F

## 2022-06-27 DIAGNOSIS — S32040D Wedge compression fracture of fourth lumbar vertebra, subsequent encounter for fracture with routine healing: Secondary | ICD-10-CM | POA: Diagnosis not present

## 2022-06-27 DIAGNOSIS — D45 Polycythemia vera: Secondary | ICD-10-CM | POA: Diagnosis not present

## 2022-06-27 DIAGNOSIS — M25551 Pain in right hip: Secondary | ICD-10-CM | POA: Diagnosis not present

## 2022-06-27 DIAGNOSIS — D751 Secondary polycythemia: Secondary | ICD-10-CM

## 2022-06-27 DIAGNOSIS — G8929 Other chronic pain: Secondary | ICD-10-CM | POA: Diagnosis not present

## 2022-06-27 DIAGNOSIS — M5441 Lumbago with sciatica, right side: Secondary | ICD-10-CM | POA: Diagnosis not present

## 2022-06-27 LAB — HEMATOCRIT: HCT: 44.2 % (ref 39.0–52.0)

## 2022-06-27 LAB — HEMOGLOBIN: Hemoglobin: 13.4 g/dL (ref 13.0–17.0)

## 2022-06-27 NOTE — Progress Notes (Signed)
Pt here for phlebotomy today. HCT 44. Proceed with 350 ml phlebotomy. Pt tolerated procedure well. VSS. Feeling well at time of discharge.

## 2022-06-27 NOTE — Patient Instructions (Signed)

## 2022-07-02 ENCOUNTER — Inpatient Hospital Stay: Payer: PPO

## 2022-07-10 DIAGNOSIS — E781 Pure hyperglyceridemia: Secondary | ICD-10-CM | POA: Diagnosis not present

## 2022-07-10 DIAGNOSIS — I10 Essential (primary) hypertension: Secondary | ICD-10-CM | POA: Diagnosis not present

## 2022-07-11 DIAGNOSIS — I10 Essential (primary) hypertension: Secondary | ICD-10-CM | POA: Diagnosis not present

## 2022-07-11 DIAGNOSIS — Z0000 Encounter for general adult medical examination without abnormal findings: Secondary | ICD-10-CM | POA: Diagnosis not present

## 2022-07-11 DIAGNOSIS — E781 Pure hyperglyceridemia: Secondary | ICD-10-CM | POA: Diagnosis not present

## 2022-07-21 DIAGNOSIS — J029 Acute pharyngitis, unspecified: Secondary | ICD-10-CM | POA: Diagnosis not present

## 2022-07-21 DIAGNOSIS — J209 Acute bronchitis, unspecified: Secondary | ICD-10-CM | POA: Diagnosis not present

## 2022-07-21 DIAGNOSIS — U071 COVID-19: Secondary | ICD-10-CM | POA: Diagnosis not present

## 2022-07-21 DIAGNOSIS — J019 Acute sinusitis, unspecified: Secondary | ICD-10-CM | POA: Diagnosis not present

## 2022-07-21 DIAGNOSIS — R051 Acute cough: Secondary | ICD-10-CM | POA: Diagnosis not present

## 2022-07-21 DIAGNOSIS — Z03818 Encounter for observation for suspected exposure to other biological agents ruled out: Secondary | ICD-10-CM | POA: Diagnosis not present

## 2022-07-21 DIAGNOSIS — B9689 Other specified bacterial agents as the cause of diseases classified elsewhere: Secondary | ICD-10-CM | POA: Diagnosis not present

## 2022-08-03 ENCOUNTER — Inpatient Hospital Stay: Payer: PPO

## 2022-08-03 ENCOUNTER — Inpatient Hospital Stay: Payer: PPO | Attending: Internal Medicine

## 2022-08-03 ENCOUNTER — Encounter: Payer: Self-pay | Admitting: Internal Medicine

## 2022-08-03 ENCOUNTER — Inpatient Hospital Stay: Payer: PPO | Admitting: Internal Medicine

## 2022-08-03 DIAGNOSIS — D45 Polycythemia vera: Secondary | ICD-10-CM | POA: Insufficient documentation

## 2022-08-03 DIAGNOSIS — Z7982 Long term (current) use of aspirin: Secondary | ICD-10-CM | POA: Diagnosis not present

## 2022-08-03 LAB — CBC WITH DIFFERENTIAL/PLATELET
Abs Immature Granulocytes: 0.07 10*3/uL (ref 0.00–0.07)
Basophils Absolute: 0.1 10*3/uL (ref 0.0–0.1)
Basophils Relative: 1 %
Eosinophils Absolute: 0.1 10*3/uL (ref 0.0–0.5)
Eosinophils Relative: 1 %
HCT: 41.5 % (ref 39.0–52.0)
Hemoglobin: 12.5 g/dL — ABNORMAL LOW (ref 13.0–17.0)
Immature Granulocytes: 1 %
Lymphocytes Relative: 10 %
Lymphs Abs: 1 10*3/uL (ref 0.7–4.0)
MCH: 27.8 pg (ref 26.0–34.0)
MCHC: 30.1 g/dL (ref 30.0–36.0)
MCV: 92.4 fL (ref 80.0–100.0)
Monocytes Absolute: 0.3 10*3/uL (ref 0.1–1.0)
Monocytes Relative: 3 %
Neutro Abs: 7.9 10*3/uL — ABNORMAL HIGH (ref 1.7–7.7)
Neutrophils Relative %: 84 %
Platelets: 170 10*3/uL (ref 150–400)
RBC: 4.49 MIL/uL (ref 4.22–5.81)
RDW: 15.2 % (ref 11.5–15.5)
WBC: 9.4 10*3/uL (ref 4.0–10.5)
nRBC: 0 % (ref 0.0–0.2)

## 2022-08-03 LAB — COMPREHENSIVE METABOLIC PANEL
ALT: 13 U/L (ref 0–44)
AST: 19 U/L (ref 15–41)
Albumin: 3.9 g/dL (ref 3.5–5.0)
Alkaline Phosphatase: 79 U/L (ref 38–126)
Anion gap: 5 (ref 5–15)
BUN: 9 mg/dL (ref 8–23)
CO2: 28 mmol/L (ref 22–32)
Calcium: 8.4 mg/dL — ABNORMAL LOW (ref 8.9–10.3)
Chloride: 105 mmol/L (ref 98–111)
Creatinine, Ser: 0.89 mg/dL (ref 0.61–1.24)
GFR, Estimated: >60 mL/min (ref >60)
Glucose, Bld: 109 mg/dL — ABNORMAL HIGH (ref 70–99)
Potassium: 4.1 mmol/L (ref 3.5–5.1)
Sodium: 138 mmol/L (ref 135–145)
Total Bilirubin: 0.8 mg/dL (ref 0.3–1.2)
Total Protein: 6.8 g/dL (ref 6.5–8.1)

## 2022-08-03 LAB — LACTATE DEHYDROGENASE: LDH: 151 U/L (ref 98–192)

## 2022-08-03 NOTE — Assessment & Plan Note (Addendum)
#  Polycythemia vera Jak 2+; on Hydrea 500 mg twice a day; except Saturday Sunday-extra pill. STABLE.  June 2021-bone marrow biopsy persistent myeloproliferative disease; no evidence of transformation or leukemia. MAY 2023-ultrasound: Splenomegaly with a volume 1097 mL, compared to 1319 prior study from 01/22/2022.  # Today-hematocrit is  41.3  HOLD  phlebotomy today. Continue Hydrea.  #Disposition:  # HOLD  Phlebotomy today # in 1 months-H&H; possible phlebotomy. # in 71months-H&H; possible phlebotomy. # in 3 months- MD; cbc/cmp/LDH-possible phlebotomy;---Dr.B

## 2022-08-03 NOTE — Progress Notes (Unsigned)
[place] OFFICE PROGRESS NOTE  Patient Care Team: Dion Body, MD as PCP - General (Family Medicine) Cammie Sickle, MD as Consulting Physician (Internal Medicine)   Cancer Staging  No matching staging information was found for the patient.   Oncology History Overview Note  # 2014- POLYCYTHEMIA VERA - JAK-2 Positive; on Hydrea $RemoveB'500mg'vIaeMlfI$ /day; aspirin 81 mg a day; phlebotomy for Hct >43 [headaches];   # MAY 2019- BMBx- pan-hypercellular; no evidence of significant fibrosis; MAY [place]- 1389 cc/ splenomegaly; Oct 11th 2019- Increase in splenomegaly; Increased Hydrea $RemoveBeforeDE'1500mg'imJutYUlWSJcNnF$ /day.   # nonarteritic ischemic optic neuropathy-Left eye [may 2018; Dr.Dingledein/Dr.Sitko at [facility]; Previous Right eye ---------------------------------------    DIAGNOSIS: $RemoveBef'[ ]'[id number]$  POLYCYTHEMIA VERA  ;GOALS: control  CURRENT/MOST RECENT THERAPY $RemoveBefore'[ ]'TDdtChECKpgpe$  Hydrea        Polycythemia vera (HCC)      INTERVAL HISTORY:  Derek [name] 66 y.o.  male pleasant patient above history of polycythemia vera on hydrea is here for follow-up.  In the interim patient was evaluated by ophthalmology for blurry vision.  Currently resolved.  No headaches.  Patient received phlebotomy approximately a month ago.  No new symptoms.  No nausea no vomiting no weight loss no night sweats. Denies any fevers.  Patient admits compliance to Hydrea.  Review of Systems  Constitutional:  Negative for chills, diaphoresis and weight loss.  HENT:  Negative for nosebleeds and sore throat.   Eyes:  Negative for double vision.  Respiratory:  Negative for cough, hemoptysis, sputum production, shortness of breath and wheezing.   Cardiovascular:  Negative for chest pain, palpitations, orthopnea and leg swelling.  Gastrointestinal:  Negative for blood in stool, constipation, diarrhea, heartburn, melena, nausea and vomiting.  Genitourinary:  Negative for dysuria, frequency and urgency.  Musculoskeletal:  Negative for back pain and  joint pain.  Skin: Negative.  Negative for itching and rash.  Neurological:  Negative for dizziness, tingling, focal weakness, weakness and headaches.  Endo/Heme/Allergies:  Does not bruise/bleed easily.  Psychiatric/Behavioral:  Negative for depression. The patient is not nervous/anxious and does not have insomnia.       PAST MEDICAL HISTORY :  Past Medical History:  Diagnosis Date   Arthritis    GERD (gastroesophageal reflux disease)    Gout    Low serum testosterone level    Polycythemia vera ([place])    Polycythemia, secondary 06/08/2015   Pulmonary nodules    Thoracic spine fracture (HCC)    compression fracture following a fall    PAST SURGICAL HISTORY :   Past Surgical History:  Procedure Laterality Date   COLONOSCOPY  2013    FAMILY HISTORY :   Family History  Problem Relation Age of Onset   Throat cancer Other        uncle   Migraines Neg Hx    Multiple sclerosis Neg Hx    Neurofibromatosis Neg Hx    Parkinsonism Neg Hx    Seizures Neg Hx    Neuropathy Neg Hx     SOCIAL HISTORY:   Social History   Tobacco Use   Smoking status: Never   Smokeless tobacco: Never  Vaping Use   Vaping Use: Never used  Substance Use Topics   Alcohol use: Yes    Alcohol/week: 0.0 standard drinks of alcohol    Comment: occasional alcohol use   Drug use: No    ALLERGIES:  is allergic to penicillin g.  MEDICATIONS:  Current Outpatient Medications  Medication Sig Dispense Refill   acetaminophen (TYLENOL) 500 MG  tablet Take 500 mg by mouth every 6 (six) hours as needed for fever.     amLODipine (NORVASC) 5 MG tablet Take 5 mg daily by mouth.  11   aspirin EC 81 MG tablet Take 81 mg daily by mouth.      Cholecalciferol 25 MCG (1000 UT) tablet Take 1,000 Units daily by mouth.      Esomeprazole Magnesium (NEXIUM PO) Take 2 capsules by mouth daily.      hydroxyurea (HYDREA) 500 MG capsule TAKE 1 CAPSULE BY MOUTH TWICE A DAY EXCEPT SATURDAY AND SUNDAY (TAKE AN EXTRA PILL ON  SAT/SUNDAY) MAY TAKE WITH FOOD TO MINIMIZE GI SIDE EFFECTS. 210 capsule 1   ibuprofen (ADVIL) 200 MG tablet Take 800 mg by mouth once a week. For headache as needed     Multiple Vitamins-Minerals (MULTIVITAMIN ADULT PO) Take 1 tablet by mouth 1 day or 1 dose.     econazole nitrate 1 % cream Apply topically 2 (two) times daily. (Patient not taking: Reported on 01/30/2022)     indomethacin (INDOCIN) 50 MG capsule Take by mouth. (Patient not taking: Reported on 01/30/2022)     ondansetron (ZOFRAN ODT) 4 MG disintegrating tablet Take 1 tablet (4 mg total) by mouth every 8 (eight) hours as needed for nausea or vomiting. (Patient not taking: Reported on 07/31/2021) 20 tablet 0   No current facility-administered medications for this visit.    PHYSICAL EXAMINATION: ECOG PERFORMANCE STATUS: 0 - Asymptomatic  BP 131/80 (BP Location: Left Arm, Patient Position: Sitting)   Pulse 99   Temp (!) 97.5 F (36.4 C) (Tympanic)   Resp 18   Wt 195 lb 3.2 oz (88.5 kg)   BMI 29.68 kg/m   Filed Weights   08/03/22 1400  Weight: 195 lb 3.2 oz (88.5 kg)    Physical Exam HENT:     Head: Normocephalic and atraumatic.     Mouth/Throat:     Pharynx: No oropharyngeal exudate.  Eyes:     Pupils: Pupils are equal, round, and reactive to light.  Cardiovascular:     Rate and Rhythm: Normal rate and regular rhythm.  Pulmonary:     Effort: No respiratory distress.     Breath sounds: No wheezing.  Abdominal:     General: Bowel sounds are normal. There is no distension.     Palpations: Abdomen is soft. There is no mass.     Tenderness: There is no abdominal tenderness. There is no guarding or rebound.     Comments: Positive for mild splenomegaly.  Musculoskeletal:        General: No tenderness. Normal range of motion.     Cervical back: Normal range of motion and neck supple.  Skin:    General: Skin is warm.  Neurological:     Mental Status: He is alert and oriented to person, place, and time.  Psychiatric:         Mood and Affect: Affect normal.     LABORATORY DATA:  I have reviewed the data as listed    Component Value Date/Time   NA 138 08/03/2022 1407   NA 140 03/02/2014 0835   K 4.1 08/03/2022 1407   K 3.8 03/02/2014 0835   CL 105 08/03/2022 1407   CL 109 (H) 03/02/2014 0835   CO2 28 08/03/2022 1407   CO2 26 03/02/2014 0835   GLUCOSE 109 (H) 08/03/2022 1407   GLUCOSE 108 (H) 03/02/2014 0835   BUN 9 08/03/2022 1407   BUN 9 03/02/2014  0835   CREATININE 0.89 08/03/2022 1407   CREATININE 0.99 03/02/2014 0835   CALCIUM 8.4 (L) 08/03/2022 1407   CALCIUM 8.6 03/02/2014 0835   PROT 6.8 08/03/2022 1407   PROT 7.0 03/02/2014 0835   ALBUMIN 3.9 08/03/2022 1407   ALBUMIN 3.7 03/02/2014 0835   AST 19 08/03/2022 1407   AST 20 03/02/2014 0835   ALT 13 08/03/2022 1407   ALT 24 03/02/2014 0835   ALKPHOS 79 08/03/2022 1407   ALKPHOS 96 03/02/2014 0835   BILITOT 0.8 08/03/2022 1407   BILITOT 0.6 03/02/2014 0835   GFRNONAA >60 08/03/2022 1407   GFRNONAA >60 03/02/2014 0835   GFRAA >60 08/15/2020 0953   GFRAA >60 03/02/2014 0835    No results found for: "SPEP", "UPEP"  Lab Results  Component Value Date   WBC 9.4 08/03/2022   NEUTROABS 7.9 (H) 08/03/2022   HGB 12.5 (L) 08/03/2022   HCT 41.5 08/03/2022   MCV 92.4 08/03/2022   PLT 170 08/03/2022      Chemistry      Component Value Date/Time   NA 138 08/03/2022 1407   NA 140 03/02/2014 0835   K 4.1 08/03/2022 1407   K 3.8 03/02/2014 0835   CL 105 08/03/2022 1407   CL 109 (H) 03/02/2014 0835   CO2 28 08/03/2022 1407   CO2 26 03/02/2014 0835   BUN 9 08/03/2022 1407   BUN 9 03/02/2014 0835   CREATININE 0.89 08/03/2022 1407   CREATININE 0.99 03/02/2014 0835      Component Value Date/Time   CALCIUM 8.4 (L) 08/03/2022 1407   CALCIUM 8.6 03/02/2014 0835   ALKPHOS 79 08/03/2022 1407   ALKPHOS 96 03/02/2014 0835   AST 19 08/03/2022 1407   AST 20 03/02/2014 0835   ALT 13 08/03/2022 1407   ALT 24 03/02/2014 0835   BILITOT  0.8 08/03/2022 1407   BILITOT 0.6 03/02/2014 0835       RADIOGRAPHIC STUDIES: I have personally reviewed the radiological images as listed and agreed with the findings in the report. No results found.   ASSESSMENT & PLAN:  Polycythemia vera ([place]) # Polycythemia vera Jak 2+; on Hydrea 500 mg twice a day; except Saturday Sunday-extra pill. STABLE.  June 2021-bone marrow biopsy persistent myeloproliferative disease; no evidence of transformation or leukemia. MAY 2023-ultrasound: Splenomegaly with a volume 1097 mL, compared to 1319 prior study from 01/22/2022.  # Today-hematocrit is  41.3  HOLD  phlebotomy today. Continue Hydrea.  #Disposition:  # HOLD  Phlebotomy today # in 1 months-H&H; possible phlebotomy. # in 39months-H&H; possible phlebotomy. # in 3 months- MD; cbc/cmp/LDH-possible phlebotomy;---Dr.B   Orders Placed This Encounter  Procedures   Hematocrit    Standing Status:   Standing    Number of Occurrences:   2    Standing Expiration Date:   08/04/2023   Hemoglobin    Standing Status:   Standing    Number of Occurrences:   2    Standing Expiration Date:   08/04/2023   Comprehensive metabolic panel    Standing Status:   Future    Standing Expiration Date:   08/04/2023   CBC with Differential/Platelet    Standing Status:   Future    Standing Expiration Date:   08/04/2023   Lactate dehydrogenase    Standing Status:   Future    Standing Expiration Date:   08/04/2023    All questions were answered. The patient knows to call the clinic with any problems, questions or concerns.  Cammie Sickle, MD 08/04/2022 7:28 AM

## 2022-08-03 NOTE — Progress Notes (Unsigned)
Patient denies new problems/concerns today.   °

## 2022-08-04 ENCOUNTER — Encounter: Payer: Self-pay | Admitting: Internal Medicine

## 2022-08-20 ENCOUNTER — Other Ambulatory Visit: Payer: Self-pay | Admitting: Internal Medicine

## 2022-08-20 DIAGNOSIS — D45 Polycythemia vera: Secondary | ICD-10-CM

## 2022-08-21 ENCOUNTER — Encounter: Payer: Self-pay | Admitting: Internal Medicine

## 2022-08-21 NOTE — Telephone Encounter (Signed)
CBC with Differential/Platelet Order: [id number] Status: Final result    Visible to patient: Yes (seen)    Next appt: 09/03/2022 at 12:45 PM in Oncology ([facility])    Dx: Polycythemia vera ([place])    0 Result Notes           Component Ref Range & Units 2 wk ago (08/03/22) 1 mo ago (06/27/22) 1 mo ago (06/27/22) 2 mo ago (06/06/22) 2 mo ago (06/06/22) 3 mo ago (05/08/22) 3 mo ago (05/01/22)  WBC 4.0 - 10.5 K/uL 9.4      12.6 High   10.5   RBC 4.22 - 5.81 MIL/uL 4.49      4.50  4.70   Hemoglobin 13.0 - 17.0 g/dL 12.5 Low    13.4 CM  12.6 Low  CM   12.1 Low   13.2   HCT 39.0 - 52.0 % 41.5  44.2 CM    41.1 CM  40.9  42.3   MCV 80.0 - 100.0 fL 92.4      90.9  90.0   MCH 26.0 - 34.0 pg 27.8      26.9  28.1   MCHC 30.0 - 36.0 g/dL 30.1      29.6 Low   31.2   RDW 11.5 - 15.5 % 15.2      15.2  15.2   Platelets 150 - 400 K/uL 170      236  176   nRBC 0.0 - 0.2 % 0.0      0.0  0.0   Neutrophils Relative % % 84      87  86   Neutro Abs 1.7 - 7.7 K/uL 7.9 High       11.0 High   9.1 High    Lymphocytes Relative % '10      8  8   '$ Lymphs Abs 0.7 - 4.0 K/uL 1.0      1.0  0.9   Monocytes Relative % '3      2  3   '$ Monocytes Absolute 0.1 - 1.0 K/uL 0.3      0.3  0.3   Eosinophils Relative % '1      1  1   '$ Eosinophils Absolute 0.0 - 0.5 K/uL 0.1      0.2  0.1   Basophils Relative % '1      1  1   '$ Basophils Absolute 0.0 - 0.1 K/uL 0.1      0.1  0.1   Immature Granulocytes % '1      1  1   '$ Abs Immature Granulocytes 0.00 - 0.07 K/uL 0.07      0.15 High  CM  0.08 High  CM   Comment: Performed at [facility], [address] [place], [place] [address]  Resulting Agency  [facility] CLIN LAB [place] [facility] [place] [facility] [place] [facility] [place] [facility] [place] [facility] [place] [facility]         Specimen Collected: 08/03/22 14:07 Last Resulted: 08/03/22 14:15      Lab Flowsheet    Order Details    View Encounter    Lab and Collection Details    Routing    Result History    View All Conversations on this Encounter       CM=Additional comments      Result Care Coordination   Patient Communication   Add Comments   Seen Back to Top       Other Results from 08/03/2022  Lactate dehydrogenase Order: [id number] Status: Final result    Visible to patient: Yes (seen)    Next appt: 09/03/2022 at 12:45 PM in Oncology ([facility])    Dx: Polycythemia vera ([place])    0 Result Notes           Component Ref Range & Units 2 wk ago (08/03/22) 3 mo ago (05/01/22) 6 mo ago (01/30/22) 9 mo ago (11/07/21) 1 yr ago (07/31/21) 1 yr ago (03/27/21) 1 yr ago (11/28/20)  LDH 98 - 192 U/L 151  169 CM  171 CM  170 CM  201 High  CM  170 CM  152 CM   Comment: Performed at [facility], [address] [place], [place] [address]  Resulting Agency  [facility] [place] [facility] [place] [facility] [place] [facility] [place] [facility] [place] [facility]         Specimen Collected: 08/03/22 14:07 Last Resulted: 08/03/22 14:29      Lab Flowsheet    Order Details    View Encounter    Lab and Collection Details    Routing    Result History    View All Conversations on this Encounter      CM=Additional comments      Result Care Coordination   Patient Communication   Add Comments   Seen Back to Top          Contains abnormal data Comprehensive metabolic panel Order: [id number] Status: Final result    Visible to patient: Yes (seen)    Next appt: 09/03/2022 at 12:45 PM in Oncology ([facility])    Dx: Polycythemia vera ([place])    0 Result Notes           Component Ref Range & Units 2 wk ago (08/03/22) 3 mo ago (05/01/22) 6 mo ago (01/30/22) 9 mo ago (11/07/21) 11 mo ago (09/22/21) 1 yr ago (07/31/21) 1 yr ago (03/27/21)  Sodium 135 - 145 mmol/L 138  138  139  141  141  138  141   Potassium 3.5 - 5.1 mmol/L 4.1  3.9  3.9  3.6  4.2  3.8  3.8   Chloride 98 - 111 mmol/L 105  104  105  105  103  104  106   CO2 22 - 32 mmol/L '28  25  26  26  27  25  26   '$ Glucose, Bld 70 - 99 mg/dL 109 High   125 High  CM  112 High  CM  157 High  CM   93 CM  97 CM  128 High  CM   Comment: Glucose reference range applies only to samples taken after fasting for at least 8 hours.  BUN 8 - 23 mg/dL '9  10  11  13  9  11  10   '$ Creatinine, Ser 0.61 - 1.24 mg/dL 0.89  0.98  0.80  0.92  0.90  0.81  0.83   Calcium 8.9 - 10.3 mg/dL 8.4 Low   8.6 Low   9.3  8.8 Low   8.8 Low   8.7 Low   8.8 Low    Total Protein 6.5 - 8.1 g/dL 6.8  6.7  6.7  7.0   6.8  6.7   Albumin 3.5 - 5.0 g/dL 3.9  4.1  4.1  4.2   4.2  4.2   AST 15 - 41 U/L '19  22  20  20   23  '$ 22  ALT 0 - 44 U/L '13  16  16  16   20  18   '$ Alkaline Phosphatase 38 - 126 U/L 79  95  107  106   101  89   Total Bilirubin 0.3 - 1.2 mg/dL 0.8  0.6  0.6  1.0   1.0  0.8   GFR, Estimated >60 mL/min >60  >60 CM  >60 CM  >60 CM  >60 CM  >60 CM  >60 CM   Comment: (NOTE)  Calculated using the CKD-EPI Creatinine Equation (2021)   Anion gap 5 - '15 5  9 '$ CM  8 CM  10 CM  11 CM  9 CM  9 CM   Comment: Performed at [facility], [address] [place], [place] [address]  Resulting Agency  [facility] CL[facility] LAB [place] CL[facility] [place] [facility] [place] [facility] [place] [facility] [place] [facility] [place] [facility]         Specimen Collected: 08/03/22 14:07 Last Resulted: 08/03/22 14:29

## 2022-09-03 ENCOUNTER — Inpatient Hospital Stay: Payer: PPO

## 2022-09-03 ENCOUNTER — Inpatient Hospital Stay: Payer: PPO | Attending: Internal Medicine

## 2022-09-03 DIAGNOSIS — D45 Polycythemia vera: Secondary | ICD-10-CM | POA: Diagnosis not present

## 2022-09-03 LAB — COMPREHENSIVE METABOLIC PANEL
ALT: 14 U/L (ref 0–44)
AST: 22 U/L (ref 15–41)
Albumin: 4.4 g/dL (ref 3.5–5.0)
Alkaline Phosphatase: 88 U/L (ref 38–126)
Anion gap: 6 (ref 5–15)
BUN: 10 mg/dL (ref 8–23)
CO2: 27 mmol/L (ref 22–32)
Calcium: 8.8 mg/dL — ABNORMAL LOW (ref 8.9–10.3)
Chloride: 104 mmol/L (ref 98–111)
Creatinine, Ser: 0.81 mg/dL (ref 0.61–1.24)
GFR, Estimated: >60 mL/min (ref >60)
Glucose, Bld: 107 mg/dL — ABNORMAL HIGH (ref 70–99)
Potassium: 3.7 mmol/L (ref 3.5–5.1)
Sodium: 137 mmol/L (ref 135–145)
Total Bilirubin: 1 mg/dL (ref 0.3–1.2)
Total Protein: 7.2 g/dL (ref 6.5–8.1)

## 2022-09-03 LAB — LACTATE DEHYDROGENASE: LDH: 189 U/L (ref 98–192)

## 2022-09-03 LAB — CBC WITH DIFFERENTIAL/PLATELET
Abs Immature Granulocytes: 0.15 10*3/uL — ABNORMAL HIGH (ref 0.00–0.07)
Basophils Absolute: 0.1 10*3/uL (ref 0.0–0.1)
Basophils Relative: 1 %
Eosinophils Absolute: 0.2 10*3/uL (ref 0.0–0.5)
Eosinophils Relative: 1 %
HCT: 41.7 % (ref 39.0–52.0)
Hemoglobin: 12.7 g/dL — ABNORMAL LOW (ref 13.0–17.0)
Immature Granulocytes: 1 %
Lymphocytes Relative: 10 %
Lymphs Abs: 1.2 10*3/uL (ref 0.7–4.0)
MCH: 27.9 pg (ref 26.0–34.0)
MCHC: 30.5 g/dL (ref 30.0–36.0)
MCV: 91.4 fL (ref 80.0–100.0)
Monocytes Absolute: 0.3 10*3/uL (ref 0.1–1.0)
Monocytes Relative: 2 %
Neutro Abs: 11 10*3/uL — ABNORMAL HIGH (ref 1.7–7.7)
Neutrophils Relative %: 85 %
Platelets: 125 10*3/uL — ABNORMAL LOW (ref 150–400)
RBC: 4.56 MIL/uL (ref 4.22–5.81)
RDW: 15.4 % (ref 11.5–15.5)
WBC: 13 10*3/uL — ABNORMAL HIGH (ref 4.0–10.5)
nRBC: 0 % (ref 0.0–0.2)

## 2022-09-03 NOTE — Progress Notes (Signed)
Per treatment parameters, therapeutic phlebotomy not indicated today. Pt informed.

## 2022-10-03 ENCOUNTER — Inpatient Hospital Stay: Payer: PPO | Attending: Internal Medicine

## 2022-10-03 ENCOUNTER — Inpatient Hospital Stay: Payer: PPO

## 2022-10-03 VITALS — BP 143/86 | HR 96 | Temp 97.6°F

## 2022-10-03 DIAGNOSIS — D45 Polycythemia vera: Secondary | ICD-10-CM | POA: Insufficient documentation

## 2022-10-03 DIAGNOSIS — D751 Secondary polycythemia: Secondary | ICD-10-CM

## 2022-10-03 LAB — HEMATOCRIT: HCT: 44.4 % (ref 39.0–52.0)

## 2022-10-03 LAB — HEMOGLOBIN: Hemoglobin: 13.3 g/dL (ref 13.0–17.0)

## 2022-10-03 NOTE — Patient Instructions (Signed)

## 2022-10-03 NOTE — Progress Notes (Signed)
Patient tolatered phlebotomy well today, performed in LWrist using 20g angiocath. 350 cc removed as ordered. 44.4 HCT today. No concerns voiced. Snack and po fluids offered. Stable at discharge. Refused post monitoring. Refused AVS.

## 2022-11-02 ENCOUNTER — Inpatient Hospital Stay: Payer: PPO | Admitting: Medical Oncology

## 2022-11-02 ENCOUNTER — Ambulatory Visit: Payer: PPO | Admitting: Internal Medicine

## 2022-11-02 ENCOUNTER — Other Ambulatory Visit: Payer: Self-pay | Admitting: *Deleted

## 2022-11-02 ENCOUNTER — Other Ambulatory Visit: Payer: PPO

## 2022-11-02 ENCOUNTER — Inpatient Hospital Stay: Payer: PPO

## 2022-11-02 ENCOUNTER — Inpatient Hospital Stay: Payer: PPO | Attending: Internal Medicine

## 2022-11-02 VITALS — BP 131/78 | HR 89 | Temp 96.8°F | Resp 16 | Wt 195.0 lb

## 2022-11-02 DIAGNOSIS — Z79899 Other long term (current) drug therapy: Secondary | ICD-10-CM | POA: Insufficient documentation

## 2022-11-02 DIAGNOSIS — R161 Splenomegaly, not elsewhere classified: Secondary | ICD-10-CM | POA: Diagnosis not present

## 2022-11-02 DIAGNOSIS — D45 Polycythemia vera: Secondary | ICD-10-CM

## 2022-11-02 DIAGNOSIS — D751 Secondary polycythemia: Secondary | ICD-10-CM

## 2022-11-02 LAB — CBC WITH DIFFERENTIAL/PLATELET
Abs Immature Granulocytes: 0.07 10*3/uL (ref 0.00–0.07)
Basophils Absolute: 0.1 10*3/uL (ref 0.0–0.1)
Basophils Relative: 1 %
Eosinophils Absolute: 0.1 10*3/uL (ref 0.0–0.5)
Eosinophils Relative: 1 %
HCT: 41.4 % (ref 39.0–52.0)
Hemoglobin: 12.5 g/dL — ABNORMAL LOW (ref 13.0–17.0)
Immature Granulocytes: 1 %
Lymphocytes Relative: 9 %
Lymphs Abs: 1 10*3/uL (ref 0.7–4.0)
MCH: 27.5 pg (ref 26.0–34.0)
MCHC: 30.2 g/dL (ref 30.0–36.0)
MCV: 91.2 fL (ref 80.0–100.0)
Monocytes Absolute: 0.3 10*3/uL (ref 0.1–1.0)
Monocytes Relative: 3 %
Neutro Abs: 9.3 10*3/uL — ABNORMAL HIGH (ref 1.7–7.7)
Neutrophils Relative %: 85 %
Platelets: 284 10*3/uL (ref 150–400)
RBC: 4.54 MIL/uL (ref 4.22–5.81)
RDW: 15.4 % (ref 11.5–15.5)
Smear Review: ADEQUATE
WBC: 10.9 10*3/uL — ABNORMAL HIGH (ref 4.0–10.5)
nRBC: 0 % (ref 0.0–0.2)

## 2022-11-02 LAB — COMPREHENSIVE METABOLIC PANEL
ALT: 18 U/L (ref 0–44)
AST: 25 U/L (ref 15–41)
Albumin: 4.1 g/dL (ref 3.5–5.0)
Alkaline Phosphatase: 95 U/L (ref 38–126)
Anion gap: 9 (ref 5–15)
BUN: 9 mg/dL (ref 8–23)
CO2: 24 mmol/L (ref 22–32)
Calcium: 8.8 mg/dL — ABNORMAL LOW (ref 8.9–10.3)
Chloride: 104 mmol/L (ref 98–111)
Creatinine, Ser: 0.87 mg/dL (ref 0.61–1.24)
GFR, Estimated: >60 mL/min (ref >60)
Glucose, Bld: 124 mg/dL — ABNORMAL HIGH (ref 70–99)
Potassium: 3.6 mmol/L (ref 3.5–5.1)
Sodium: 137 mmol/L (ref 135–145)
Total Bilirubin: 0.9 mg/dL (ref 0.3–1.2)
Total Protein: 7 g/dL (ref 6.5–8.1)

## 2022-11-02 LAB — LACTATE DEHYDROGENASE: LDH: 190 U/L (ref 98–192)

## 2022-11-02 NOTE — Progress Notes (Signed)
[place] OFFICE PROGRESS NOTE  Patient Care Team: Dion Body, MD as PCP - General (Family Medicine) Cammie Sickle, MD as Consulting Physician (Internal Medicine)   Cancer Staging  No matching staging information was found for the patient.   Oncology History Overview Note  # 2014- POLYCYTHEMIA VERA - JAK-2 Positive; on Hydrea 559m/day; aspirin 81 mg a day; phlebotomy for Hct >43 [headaches];   # MAY 2019- BMBx- pan-hypercellular; no evidence of significant fibrosis; MAY [place] 138[place] cc/ splenomegaly; Oct 11th 2019- Increase in splenomegaly; Increased Hydrea 15040mday.   # nonarteritic ischemic optic neuropathy-Left eye [place]may 2018[place]    DIAGNOSIS: _0  POLYCYTHEMIA VERA  ;GOALS: control  CURRENT/MOST RECENT THERAPY _1  Hydrea        Polycythemia vera (HCC)      INTERVAL HISTORY:  Derek Turton89.o.  male pleasant patient above history of polycythemia vera on hydrea is here for follow-up.  He reports that he has been feeling well. No headaches. Energy is stable. He denies any unintentional weight loss, new bone pains, night sweats.   No new symptoms.  No nausea no vomiting no weight loss no night sweats. Denies any fevers.  Patient admits compliance to Hydrea without side effects.   Review of Systems  Constitutional:  Negative for chills, diaphoresis and weight loss.  HENT:  Negative for nosebleeds and sore throat.   Eyes:  Negative for double vision.  Respiratory:  Negative for cough, hemoptysis, sputum production, shortness of breath and wheezing.   Cardiovascular:  Negative for chest pain, palpitations, orthopnea and leg swelling.  Gastrointestinal:  Negative for blood in stool, constipation, diarrhea, heartburn, melena, nausea and vomiting.  Genitourinary:  Negative for dysuria, frequency and urgency.  Musculoskeletal:  Negative for back pain and  joint pain.  Skin: Negative.  Negative for itching and rash.  Neurological:  Negative for dizziness, tingling, focal weakness, weakness and headaches.  Endo/Heme/Allergies:  Does not bruise/bleed easily.  Psychiatric/Behavioral:  Negative for depression. The patient is not nervous/anxious and does not have insomnia.       PAST MEDICAL HISTORY :  Past Medical History:  Diagnosis Date   Arthritis    GERD (gastroesophageal reflux disease)    Gout    Low serum testosterone level    [place]   Polycythemia, secondary 06/08/2015   Pulmonary nodules    Thoracic spine fracture (HCC)    compression fracture following a fall    PAST SURGICAL HISTORY :   Past Surgical History:  Procedure Laterality Date   COLONOSCOPY  2013    FAMILY HISTORY :   Family History  Problem Relation Age of Onset   Throat cancer Other        uncle   Migraines Neg Hx    Multiple sclerosis Neg Hx    Neurofibromatosis Neg Hx    Parkinsonism Neg Hx    Seizures Neg Hx    Neuropathy Neg Hx     SOCIAL HISTORY:   Social History   Tobacco Use   Smoking status: Never   Smokeless tobacco: Never  Vaping Use   Vaping Use: Never used  Substance Use Topics   Alcohol use: Yes    Alcohol/week: 0.0 standard drinks of alcohol    Comment: occasional alcohol use   Drug use: No    ALLERGIES:  is allergic to penicillin g.  MEDICATIONS:  Current Outpatient Medications  Medication Sig Dispense Refill  acetaminophen (TYLENOL) 500 MG tablet Take 500 mg by mouth every 6 (six) hours as needed for fever.     amLODipine (NORVASC) 5 MG tablet Take 5 mg daily by mouth.  11   aspirin EC 81 MG tablet Take 81 mg daily by mouth.      Cholecalciferol 25 MCG (1000 UT) tablet Take 1,000 Units daily by mouth.      econazole nitrate 1 % cream Apply topically 2 (two) times daily. (Patient not taking: Reported on 01/30/2022)     Esomeprazole Magnesium (NEXIUM PO) Take 2 capsules by mouth daily.      hydroxyurea  (HYDREA) 500 MG capsule TAKE 1 CAPSULE BY MOUTH TWICE A DAY EXCEPT SATURDAY AND SUNDAY (TAKE AN EXTRA PILL ON SAT/SUNDAY) MAY TAKE WITH FOOD TO MINIMIZE GI SIDE EFFECTS. 210 capsule 1   ibuprofen (ADVIL) 200 MG tablet Take 800 mg by mouth once a week. For headache as needed     indomethacin (INDOCIN) 50 MG capsule Take by mouth. (Patient not taking: Reported on 01/30/2022)     Multiple Vitamins-Minerals (MULTIVITAMIN ADULT PO) Take 1 tablet by mouth 1 day or 1 dose.     ondansetron (ZOFRAN ODT) 4 MG disintegrating tablet Take 1 tablet (4 mg total) by mouth every 8 (eight) hours as needed for nausea or vomiting. (Patient not taking: Reported on 07/31/2021) 20 tablet 0   No current facility-administered medications for this visit.    PHYSICAL EXAMINATION: ECOG PERFORMANCE STATUS: 0 - Asymptomatic  BP 131/78   Pulse 89   Temp (!) 96.8 F (36 C) (Tympanic)   Resp 16   Wt 195 lb (88.5 kg)   SpO2 96%   BMI 29.65 kg/m   Filed Weights   11/02/22 1330  Weight: 195 lb (88.5 kg)    Physical Exam HENT:     Head: Normocephalic and atraumatic.     Mouth/Throat:     Pharynx: No oropharyngeal exudate.  Eyes:     Pupils: Pupils are equal, round, and reactive to light.  Cardiovascular:     Rate and Rhythm: Normal rate and regular rhythm.  Pulmonary:     Effort: No respiratory distress.     Breath sounds: No wheezing.  Abdominal:     General: Bowel sounds are normal. There is no distension.     Palpations: Abdomen is soft. There is no mass.     Tenderness: There is no abdominal tenderness. There is no guarding or rebound.     Comments: Positive for mild splenomegaly.  Musculoskeletal:        General: No tenderness. Normal range of motion.     Cervical back: Normal range of motion and neck supple.  Skin:    General: Skin is warm.  Neurological:     Mental Status: He is alert and oriented to person, place, and time.  Psychiatric:        Mood and Affect: Affect normal.     LABORATORY  DATA:  I have reviewed the data as listed    Component Value Date/Time   NA 137 11/02/2022 1256   NA 140 03/02/2014 0835   K 3.6 11/02/2022 1256   K 3.8 03/02/2014 0835   CL 104 11/02/2022 1256   CL 109 (H) 03/02/2014 0835   CO2 24 11/02/2022 1256   CO2 26 03/02/2014 0835   GLUCOSE 124 (H) 11/02/2022 1256   GLUCOSE 108 (H) 03/02/2014 0835   BUN 9 11/02/2022 1256   BUN 9 03/02/2014 0835  CREATININE 0.87 11/02/2022 1256   CREATININE 0.99 03/02/2014 0835   CALCIUM 8.8 (L) 11/02/2022 1256   CALCIUM 8.6 03/02/2014 0835   PROT 7.0 11/02/2022 1256   PROT 7.0 03/02/2014 0835   ALBUMIN 4.1 11/02/2022 1256   ALBUMIN 3.7 03/02/2014 0835   AST 25 11/02/2022 1256   AST 20 03/02/2014 0835   ALT 18 11/02/2022 1256   ALT 24 03/02/2014 0835   ALKPHOS 95 11/02/2022 1256   ALKPHOS 96 03/02/2014 0835   BILITOT 0.9 11/02/2022 1256   BILITOT 0.6 03/02/2014 0835   GFRNONAA >60 11/02/2022 1256   GFRNONAA >60 03/02/2014 0835   GFRAA >60 08/15/2020 0953   GFRAA >60 03/02/2014 0835    No results found for: "SPEP", "UPEP"  Lab Results  Component Value Date   WBC 10.9 (H) 11/02/2022   NEUTROABS PENDING 11/02/2022   HGB 12.5 (L) 11/02/2022   HCT 41.4 11/02/2022   MCV 91.2 11/02/2022   PLT 284 11/02/2022      Chemistry      Component Value Date/Time   NA 137 11/02/2022 1256   NA 140 03/02/2014 0835   K 3.6 11/02/2022 1256   K 3.8 03/02/2014 0835   CL 104 11/02/2022 1256   CL 109 (H) 03/02/2014 0835   CO2 24 11/02/2022 1256   CO2 26 03/02/2014 0835   BUN 9 11/02/2022 1256   BUN 9 03/02/2014 0835   CREATININE 0.87 11/02/2022 1256   CREATININE 0.99 03/02/2014 0835      Component Value Date/Time   CALCIUM 8.8 (L) 11/02/2022 1256   CALCIUM 8.6 03/02/2014 0835   ALKPHOS 95 11/02/2022 1256   ALKPHOS 96 03/02/2014 0835   AST 25 11/02/2022 1256   AST 20 03/02/2014 0835   ALT 18 11/02/2022 1256   ALT 24 03/02/2014 0835   BILITOT 0.9 11/02/2022 1256   BILITOT 0.6 03/02/2014 0835        RADIOGRAPHIC STUDIES: I have personally reviewed the radiological images as listed and agreed with the findings in the report. No results found.   ASSESSMENT & PLAN:   Polycythemia vera ([place]) # Polycythemia vera Jak 2+; on Hydrea 500 mg twice a day; except Saturday Sunday-extra pill. STABLE.  June 2021-bone marrow biopsy persistent myeloproliferative disease; no evidence of transformation or leukemia. MAY 2023-ultrasound: Splenomegaly with a volume 1097 mL, compared to 1319 prior study from 01/22/2022.   # Today-hematocrit is  41.4  which is great. No phlebotomy needed at this time. Continue Hydrea.   #Disposition:  # HOLD  Phlebotomy today # in 1 months-H&H; possible phlebotomy. # in 93month-H&H; possible phlebotomy. # in 3 months- MD; cbc/cmp/LDH-possible phlebotomy;-[place]  All questions were answered. The patient knows to call the clinic with any problems, questions or concerns.      SHughie Closs PA-C 11/02/2022 1:41 PM

## 2022-11-23 DIAGNOSIS — D2271 Melanocytic nevi of right lower limb, including hip: Secondary | ICD-10-CM | POA: Diagnosis not present

## 2022-11-23 DIAGNOSIS — Z85828 Personal history of other malignant neoplasm of skin: Secondary | ICD-10-CM | POA: Diagnosis not present

## 2022-11-23 DIAGNOSIS — B354 Tinea corporis: Secondary | ICD-10-CM | POA: Diagnosis not present

## 2022-11-23 DIAGNOSIS — D2272 Melanocytic nevi of left lower limb, including hip: Secondary | ICD-10-CM | POA: Diagnosis not present

## 2022-11-23 DIAGNOSIS — D225 Melanocytic nevi of trunk: Secondary | ICD-10-CM | POA: Diagnosis not present

## 2022-11-23 DIAGNOSIS — D2261 Melanocytic nevi of right upper limb, including shoulder: Secondary | ICD-10-CM | POA: Diagnosis not present

## 2022-11-23 DIAGNOSIS — D2262 Melanocytic nevi of left upper limb, including shoulder: Secondary | ICD-10-CM | POA: Diagnosis not present

## 2022-11-28 ENCOUNTER — Other Ambulatory Visit: Payer: Self-pay

## 2022-11-28 DIAGNOSIS — D751 Secondary polycythemia: Secondary | ICD-10-CM

## 2022-11-30 ENCOUNTER — Inpatient Hospital Stay: Payer: PPO

## 2022-11-30 ENCOUNTER — Inpatient Hospital Stay: Payer: PPO | Attending: Internal Medicine

## 2022-11-30 VITALS — BP 130/83 | HR 101 | Temp 97.5°F | Resp 18

## 2022-11-30 DIAGNOSIS — D45 Polycythemia vera: Secondary | ICD-10-CM | POA: Insufficient documentation

## 2022-11-30 DIAGNOSIS — D751 Secondary polycythemia: Secondary | ICD-10-CM

## 2022-11-30 LAB — HEMOGLOBIN AND HEMATOCRIT, BLOOD
HCT: 44.7 % (ref 39.0–52.0)
Hemoglobin: 13.6 g/dL (ref 13.0–17.0)

## 2023-01-02 ENCOUNTER — Inpatient Hospital Stay: Payer: PPO

## 2023-01-02 ENCOUNTER — Inpatient Hospital Stay: Payer: PPO | Attending: Internal Medicine

## 2023-01-02 DIAGNOSIS — D751 Secondary polycythemia: Secondary | ICD-10-CM

## 2023-01-02 DIAGNOSIS — D45 Polycythemia vera: Secondary | ICD-10-CM | POA: Diagnosis not present

## 2023-01-02 LAB — HEMOGLOBIN AND HEMATOCRIT, BLOOD
HCT: 43.1 % (ref 39.0–52.0)
Hemoglobin: 12.8 g/dL — ABNORMAL LOW (ref 13.0–17.0)

## 2023-01-04 DIAGNOSIS — I10 Essential (primary) hypertension: Secondary | ICD-10-CM | POA: Diagnosis not present

## 2023-01-04 DIAGNOSIS — E781 Pure hyperglyceridemia: Secondary | ICD-10-CM | POA: Diagnosis not present

## 2023-01-11 DIAGNOSIS — K219 Gastro-esophageal reflux disease without esophagitis: Secondary | ICD-10-CM | POA: Diagnosis not present

## 2023-01-11 DIAGNOSIS — E781 Pure hyperglyceridemia: Secondary | ICD-10-CM | POA: Diagnosis not present

## 2023-01-11 DIAGNOSIS — I10 Essential (primary) hypertension: Secondary | ICD-10-CM | POA: Diagnosis not present

## 2023-01-11 DIAGNOSIS — F418 Other specified anxiety disorders: Secondary | ICD-10-CM | POA: Diagnosis not present

## 2023-01-25 ENCOUNTER — Inpatient Hospital Stay: Payer: PPO

## 2023-01-25 ENCOUNTER — Encounter: Payer: Self-pay | Admitting: Internal Medicine

## 2023-01-25 ENCOUNTER — Inpatient Hospital Stay (HOSPITAL_BASED_OUTPATIENT_CLINIC_OR_DEPARTMENT_OTHER): Payer: PPO | Admitting: Internal Medicine

## 2023-01-25 ENCOUNTER — Inpatient Hospital Stay: Payer: PPO | Attending: Internal Medicine

## 2023-01-25 VITALS — BP 136/88 | HR 79 | Temp 96.4°F | Resp 16 | Wt 198.0 lb

## 2023-01-25 DIAGNOSIS — Z7982 Long term (current) use of aspirin: Secondary | ICD-10-CM | POA: Insufficient documentation

## 2023-01-25 DIAGNOSIS — D45 Polycythemia vera: Secondary | ICD-10-CM | POA: Insufficient documentation

## 2023-01-25 DIAGNOSIS — D751 Secondary polycythemia: Secondary | ICD-10-CM

## 2023-01-25 LAB — COMPREHENSIVE METABOLIC PANEL
ALT: 15 U/L (ref 0–44)
AST: 21 U/L (ref 15–41)
Albumin: 4.2 g/dL (ref 3.5–5.0)
Alkaline Phosphatase: 92 U/L (ref 38–126)
Anion gap: 8 (ref 5–15)
BUN: 9 mg/dL (ref 8–23)
CO2: 28 mmol/L (ref 22–32)
Calcium: 8.7 mg/dL — ABNORMAL LOW (ref 8.9–10.3)
Chloride: 102 mmol/L (ref 98–111)
Creatinine, Ser: 0.84 mg/dL (ref 0.61–1.24)
GFR, Estimated: >60 mL/min (ref >60)
Glucose, Bld: 132 mg/dL — ABNORMAL HIGH (ref 70–99)
Potassium: 4 mmol/L (ref 3.5–5.1)
Sodium: 138 mmol/L (ref 135–145)
Total Bilirubin: 0.7 mg/dL (ref 0.3–1.2)
Total Protein: 7 g/dL (ref 6.5–8.1)

## 2023-01-25 LAB — CBC WITH DIFFERENTIAL/PLATELET
Abs Immature Granulocytes: 0.12 10*3/uL — ABNORMAL HIGH (ref 0.00–0.07)
Basophils Absolute: 0.1 10*3/uL (ref 0.0–0.1)
Basophils Relative: 1 %
Eosinophils Absolute: 0.1 10*3/uL (ref 0.0–0.5)
Eosinophils Relative: 1 %
HCT: 42.9 % (ref 39.0–52.0)
Hemoglobin: 13 g/dL (ref 13.0–17.0)
Immature Granulocytes: 1 %
Lymphocytes Relative: 9 %
Lymphs Abs: 0.8 10*3/uL (ref 0.7–4.0)
MCH: 28.3 pg (ref 26.0–34.0)
MCHC: 30.3 g/dL (ref 30.0–36.0)
MCV: 93.5 fL (ref 80.0–100.0)
Monocytes Absolute: 0.3 10*3/uL (ref 0.1–1.0)
Monocytes Relative: 3 %
Neutro Abs: 8 10*3/uL — ABNORMAL HIGH (ref 1.7–7.7)
Neutrophils Relative %: 85 %
Platelets: 321 10*3/uL (ref 150–400)
RBC: 4.59 MIL/uL (ref 4.22–5.81)
RDW: 16.2 % — ABNORMAL HIGH (ref 11.5–15.5)
WBC: 9.4 10*3/uL (ref 4.0–10.5)
nRBC: 0 % (ref 0.0–0.2)

## 2023-01-25 LAB — LACTATE DEHYDROGENASE: LDH: 181 U/L (ref 98–192)

## 2023-01-25 NOTE — Progress Notes (Signed)
[facility] OFFICE PROGRESS NOTE  Patient Care Team: Dion Body, MD as PCP - General (Family Medicine) Cammie Sickle, MD as Consulting Physician (Internal Medicine)   Cancer Staging  No matching staging information was found for the patient.   Oncology History Overview Note  # 2014- POLYCYTHEMIA VERA - JAK-2 Positive; on Hydrea 560m/day; aspirin 81 mg a day; phlebotomy for Hct >43 [headaches];   # MAY 2019- BMBx- pan-hypercellular; no evidence of significant fibrosis; MAY [place] 1389 cc/ splenomegaly; Oct 11th 2019- Increase in splenomegaly; Increased Hydrea 15025mday.   # nonarteritic ischemic optic neuropathy-Left eye [may 2018; [facility] ---------------------------------------    DIAGNOSIS: [ ]$  POLYCYTHEMIA VERA  ;GOALS: control  CURRENT/MOST RECENT THERAPY [ ]$  Hydrea        Polycythemia vera (HCC)      INTERVAL HISTORY: ambulating independently; alone.   Derek Parady80.o.  male pleasant patient above history of polycythemia vera on hydrea is here for follow-up.  Pt in for follow up, reports lower back pain but states is ongoing from a previous injury. Denies any concerns today   No new symptoms.  No nausea no vomiting no weight loss no night sweats. Denies any fevers.  Patient admits compliance to Hydrea.  Review of Systems  Constitutional:  Negative for chills, diaphoresis and weight loss.  HENT:  Negative for nosebleeds and sore throat.   Eyes:  Negative for double vision.  Respiratory:  Negative for cough, hemoptysis, sputum production, shortness of breath and wheezing.   Cardiovascular:  Negative for chest pain, palpitations, orthopnea and leg swelling.  Gastrointestinal:  Negative for blood in stool, constipation, diarrhea, heartburn, melena, nausea and vomiting.  Genitourinary:  Negative for dysuria, frequency and urgency.  Musculoskeletal:  Negative for back pain and joint pain.   Skin: Negative.  Negative for itching and rash.  Neurological:  Negative for dizziness, tingling, focal weakness, weakness and headaches.  Endo/Heme/Allergies:  Does not bruise/bleed easily.  Psychiatric/Behavioral:  Negative for depression. The patient is not nervous/anxious and does not have insomnia.       PAST MEDICAL HISTORY :  Past Medical History:  Diagnosis Date   Arthritis    GERD (gastroesophageal reflux disease)    Gout    Low serum testosterone level    [place]   Polycythemia, secondary 06/08/2015   Pulmonary nodules    Thoracic spine fracture (HCC)    compression fracture following a fall    PAST SURGICAL HISTORY :   Past Surgical History:  Procedure Laterality Date   COLONOSCOPY  2013    FAMILY HISTORY :   Family History  Problem Relation Age of Onset   Throat cancer Other        uncle   Migraines Neg Hx    Multiple sclerosis Neg Hx    Neurofibromatosis Neg Hx    Parkinsonism Neg Hx    Seizures Neg Hx    Neuropathy Neg Hx     SOCIAL HISTORY:   Social History   Tobacco Use   Smoking status: Never   Smokeless tobacco: Never  Vaping Use   Vaping Use: Never used  Substance Use Topics   Alcohol use: Yes    Alcohol/week: 0.0 standard drinks of alcohol    Comment: occasional alcohol use   Drug use: No    ALLERGIES:  is allergic to penicillin g.  MEDICATIONS:  Current Outpatient Medications  Medication Sig Dispense Refill   acetaminophen (TYLENOL) 500  MG tablet Take 500 mg by mouth every 6 (six) hours as needed for fever.     amLODipine (NORVASC) 5 MG tablet Take 5 mg daily by mouth.  11   aspirin EC 81 MG tablet Take 81 mg daily by mouth.      Cholecalciferol 25 MCG (1000 UT) tablet Take 1,000 Units daily by mouth.      Esomeprazole Magnesium (NEXIUM PO) Take 2 capsules by mouth daily.      hydroxyurea (HYDREA) 500 MG capsule TAKE 1 CAPSULE BY MOUTH TWICE A DAY EXCEPT SATURDAY AND SUNDAY (TAKE AN EXTRA PILL ON SAT/SUNDAY) MAY  TAKE WITH FOOD TO MINIMIZE GI SIDE EFFECTS. 210 capsule 1   ibuprofen (ADVIL) 200 MG tablet Take 800 mg by mouth once a week. For headache as needed     Multiple Vitamins-Minerals (MULTIVITAMIN ADULT PO) Take 1 tablet by mouth 1 day or 1 dose.     econazole nitrate 1 % cream Apply topically 2 (two) times daily. (Patient not taking: Reported on 01/30/2022)     indomethacin (INDOCIN) 50 MG capsule Take by mouth. (Patient not taking: Reported on 01/25/2023)     ondansetron (ZOFRAN ODT) 4 MG disintegrating tablet Take 1 tablet (4 mg total) by mouth every 8 (eight) hours as needed for nausea or vomiting. (Patient not taking: Reported on 07/31/2021) 20 tablet 0   No current facility-administered medications for this visit.    PHYSICAL EXAMINATION: ECOG PERFORMANCE STATUS: 0 - Asymptomatic  BP 136/88 (BP Location: Left Arm, Patient Position: Sitting)   Pulse 79   Temp (!) 96.4 F (35.8 C) (Tympanic)   Resp 16   Wt 198 lb (89.8 kg)   BMI 30.11 kg/m   Filed Weights   01/25/23 1522  Weight: 198 lb (89.8 kg)    Physical Exam HENT:     Head: Normocephalic and atraumatic.     Mouth/Throat:     Pharynx: No oropharyngeal exudate.  Eyes:     Pupils: Pupils are equal, round, and reactive to light.  Cardiovascular:     Rate and Rhythm: Normal rate and regular rhythm.  Pulmonary:     Effort: No respiratory distress.     Breath sounds: No wheezing.  Abdominal:     General: Bowel sounds are normal. There is no distension.     Palpations: Abdomen is soft. There is no mass.     Tenderness: There is no abdominal tenderness. There is no guarding or rebound.     Comments: Positive for mild splenomegaly.  Musculoskeletal:        General: No tenderness. Normal range of motion.     Cervical back: Normal range of motion and neck supple.  Skin:    General: Skin is warm.  Neurological:     Mental Status: He is alert and oriented to person, place, and time.  Psychiatric:        Mood and Affect: Affect  normal.     LABORATORY DATA:  I have reviewed the data as listed    Component Value Date/Time   NA 138 01/25/2023 1457   NA 140 03/02/2014 0835   K 4.0 01/25/2023 1457   K 3.8 03/02/2014 0835   CL 102 01/25/2023 1457   CL 109 (H) 03/02/2014 0835   CO2 28 01/25/2023 1457   CO2 26 03/02/2014 0835   GLUCOSE 132 (H) 01/25/2023 1457   GLUCOSE 108 (H) 03/02/2014 0835   BUN 9 01/25/2023 1457   BUN 9 03/02/2014 0835  CREATININE 0.84 01/25/2023 1457   CREATININE 0.99 03/02/2014 0835   CALCIUM 8.7 (L) 01/25/2023 1457   CALCIUM 8.6 03/02/2014 0835   PROT 7.0 01/25/2023 1457   PROT 7.0 03/02/2014 0835   ALBUMIN 4.2 01/25/2023 1457   ALBUMIN 3.7 03/02/2014 0835   AST 21 01/25/2023 1457   AST 20 03/02/2014 0835   ALT 15 01/25/2023 1457   ALT 24 03/02/2014 0835   ALKPHOS 92 01/25/2023 1457   ALKPHOS 96 03/02/2014 0835   BILITOT 0.7 01/25/2023 1457   BILITOT 0.6 03/02/2014 0835   GFRNONAA >60 01/25/2023 1457   GFRNONAA >60 03/02/2014 0835   GFRAA >60 08/15/2020 0953   GFRAA >60 03/02/2014 0835    No results found for: "SPEP", "UPEP"  Lab Results  Component Value Date   WBC 9.4 01/25/2023   NEUTROABS 8.0 (H) 01/25/2023   HGB 13.0 01/25/2023   HCT 42.9 01/25/2023   MCV 93.5 01/25/2023   PLT 321 01/25/2023      Chemistry      Component Value Date/Time   NA 138 01/25/2023 1457   NA 140 03/02/2014 0835   K 4.0 01/25/2023 1457   K 3.8 03/02/2014 0835   CL 102 01/25/2023 1457   CL 109 (H) 03/02/2014 0835   CO2 28 01/25/2023 1457   CO2 26 03/02/2014 0835   BUN 9 01/25/2023 1457   BUN 9 03/02/2014 0835   CREATININE 0.84 01/25/2023 1457   CREATININE 0.99 03/02/2014 0835      Component Value Date/Time   CALCIUM 8.7 (L) 01/25/2023 1457   CALCIUM 8.6 03/02/2014 0835   ALKPHOS 92 01/25/2023 1457   ALKPHOS 96 03/02/2014 0835   AST 21 01/25/2023 1457   AST 20 03/02/2014 0835   ALT 15 01/25/2023 1457   ALT 24 03/02/2014 0835   BILITOT 0.7 01/25/2023 1457   BILITOT 0.6  03/02/2014 0835       RADIOGRAPHIC STUDIES: I have personally reviewed the radiological images as listed and agreed with the findings in the report. No results found.   ASSESSMENT & PLAN:  Polycythemia vera ([place]) # Polycythemia vera Jak 2+; on Hydrea 500 mg twice a day; except Saturday Sunday-extra pill. STABLE.  June 2021-bone marrow biopsy persistent myeloproliferative disease; no evidence of transformation or leukemia. MAY 2023-ultrasound: Splenomegaly with a volume 1097 mL, compared to 1319 prior study from 01/22/2022.stable  # Today-hematocrit is  42.9  HOLD  phlebotomy today. Continue Hydrea.  #Disposition:  # HOLD  Phlebotomy today # in 1 months-H&H; possible phlebotomy. # in 83month-H&H; possible phlebotomy. # in 3 months-H&H; possible phlebotomy. # in 4 months- MD; cbc/cmp/LDH-possible phlebotomy;---Dr.B   Orders Placed This Encounter  Procedures   Hemoglobin and Hematocrit, Blood    Standing Status:   Standing    Number of Occurrences:   3    Standing Expiration Date:   01/26/2024   CBC with Differential    Standing Status:   Future    Standing Expiration Date:   01/25/2024   Comprehensive metabolic panel    Standing Status:   Future    Standing Expiration Date:   01/25/2024   Lactate dehydrogenase    Standing Status:   Future    Standing Expiration Date:   01/26/2024    All questions were answered. The patient knows to call the clinic with any problems, questions or concerns.      GCammie Sickle MD 01/25/2023 8:20 PM

## 2023-01-25 NOTE — Progress Notes (Signed)
Pt in for follow up, reports lower back pain but states is ongoing from a previous injury.  Denies any concerns today.

## 2023-01-25 NOTE — Assessment & Plan Note (Addendum)
#   Polycythemia vera Jak 2+; on Hydrea 500 mg twice a day; except Saturday Sunday-extra pill. STABLE.  June 2021-bone marrow biopsy persistent myeloproliferative disease; no evidence of transformation or leukemia. MAY 2023-ultrasound: Splenomegaly with a volume 1097 mL, compared to 1319 prior study from 01/22/2022.stable  # Today-hematocrit is  42.9  HOLD  phlebotomy today. Continue Hydrea.  #Disposition:  # HOLD  Phlebotomy today # in 1 months-H&H; possible phlebotomy. # in 66month-H&H; possible phlebotomy. # in 3 months-H&H; possible phlebotomy. # in 4 months- MD; cbc/cmp/LDH-possible phlebotomy;---Dr.B

## 2023-02-14 ENCOUNTER — Other Ambulatory Visit: Payer: Self-pay | Admitting: Internal Medicine

## 2023-02-14 DIAGNOSIS — D45 Polycythemia vera: Secondary | ICD-10-CM

## 2023-02-14 NOTE — Telephone Encounter (Signed)
CBC with Differential/Platelet Order: [id number] Status: Final result     Visible to patient: Yes (not seen)     Next appt: 02/22/2023 at 12:45 PM in Oncology ([facility])     Dx: Polycythemia, secondary   0 Result Notes           Component Ref Range & Units 2 wk ago (01/25/23) 1 mo ago (01/02/23) 2 mo ago (11/30/22) 3 mo ago (11/02/22) 4 mo ago (10/03/22) 4 mo ago (10/03/22) 5 mo ago (09/03/22)  WBC 4.0 - 10.5 K/uL 9.4   10.9 High    13.0 High   RBC 4.22 - 5.81 MIL/uL 4.59   4.54   4.56  Hemoglobin 13.0 - 17.0 g/dL 13.0 12.8 Low  13.6 12.5 Low   13.3 CM 12.7 Low   HCT 39.0 - 52.0 % 42.9 43.1 CM 44.7 CM 41.4 44.4 CM  41.7  MCV 80.0 - 100.0 fL 93.5   91.2   91.4  MCH 26.0 - 34.0 pg 28.3   27.5   27.9  MCHC 30.0 - 36.0 g/dL 30.3   30.2   30.5  RDW 11.5 - 15.5 % 16.2 High    15.4   15.4  Platelets 150 - 400 K/uL 321   284   125 Low   nRBC 0.0 - 0.2 % 0.0   0.0   0.0  Neutrophils Relative % % 85   85   85  Neutro Abs 1.7 - 7.7 K/uL 8.0 High    9.3 High    11.0 High   Lymphocytes Relative % '9   9   10  '$ Lymphs Abs 0.7 - 4.0 K/uL 0.8   1.0   1.2  Monocytes Relative % '3   3   2  '$ Monocytes Absolute 0.1 - 1.0 K/uL 0.3   0.3   0.3  Eosinophils Relative % '1   1   1  '$ Eosinophils Absolute 0.0 - 0.5 K/uL 0.1   0.1   0.2  Basophils Relative % '1   1   1  '$ Basophils Absolute 0.0 - 0.1 K/uL 0.1   0.1   0.1  Immature Granulocytes % '1   1   1  '$ Abs Immature Granulocytes 0.00 - 0.07 K/uL 0.12 High    0.07 CM   0.15 High  CM  Comment: Performed at [facility], [address] [place], [place] [address]  WBC Morphology     MORPHOLOGY UNREMARKABLE     RBC Morphology     MORPHOLOGY UNREMARKABLE     Smear Review     PLATELETS APPEAR ADEQUATE CM     Resulting Agency  [facility] [place] [facility] [place] [facility]         Specimen Collected: 01/25/23 14:57 Last Resulted: 01/25/23 15:06      Lab Flowsheet      Order Details      View Encounter       Lab and Collection Details      Routing      Result History    View All Conversations on this Encounter      CM=Additional comments      Result Care Coordination   Patient Communication   Add Comments   Add Notifications  Back to Top      Other Results from 01/25/2023   Contains abnormal data Comprehensive metabolic panel Order: [id number] Status: Final result  Visible to patient: Yes (not seen)      Next appt: 02/22/2023 at 12:45 PM in Oncology ([facility])      Dx: Polycythemia, secondary    0 Result Notes             Component Ref Range & Units 2 wk ago (01/25/23) 3 mo ago (11/02/22) 5 mo ago (09/03/22) 6 mo ago (08/03/22) 9 mo ago (05/01/22) 1 yr ago (01/30/22) 1 yr ago (11/07/21)  Sodium 135 - 145 mmol/L 138 137 137 138 138 139 141  Potassium 3.5 - 5.1 mmol/L 4.0 3.6 3.7 4.1 3.9 3.9 3.6  Chloride 98 - 111 mmol/L 102 104 104 105 104 105 105  CO2 22 - 32 mmol/L '28 24 27 28 25 26 26  '$ Glucose, Bld 70 - 99 mg/dL 132 High  124 High  CM 107 High  CM 109 High  CM 125 High  CM 112 High  CM 157 High  CM  Comment: Glucose reference range applies only to samples taken after fasting for at least 8 hours.  BUN 8 - 23 mg/dL '9 9 10 9 10 11 13  '$ Creatinine, Ser 0.61 - 1.24 mg/dL 0.84 0.87 0.81 0.89 0.98 0.80 0.92  Calcium 8.9 - 10.3 mg/dL 8.7 Low  8.8 Low  8.8 Low  8.4 Low  8.6 Low  9.3 8.8 Low   Total Protein 6.5 - 8.1 g/dL 7.0 7.0 7.2 6.8 6.7 6.7 7.0  Albumin 3.5 - 5.0 g/dL 4.2 4.1 4.4 3.9 4.1 4.1 4.2  AST 15 - 41 U/L '21 25 22 19 22 20 20  '$ ALT 0 - 44 U/L '15 18 14 13 16 16 16  '$ Alkaline Phosphatase 38 - 126 U/L 92 95 88 79 95 107 106  Total Bilirubin 0.3 - 1.2 mg/dL 0.7 0.9 1.0 0.8 0.6 0.6 1.0  GFR, Estimated >60 mL/min >60 >60 CM >60 CM >60 CM >60 CM >60 CM >60 CM  Comment: (NOTE) Calculated using the CKD-EPI Creatinine Equation (2021)  Anion gap 5 - '15 8 9 '$ CM 6 CM 5 CM 9 CM 8 CM 10 CM  Comment: Performed at [facility], [address] [place], [place]  [address]  Resulting Agency  [facility] [place] [facility] [place] [facility] [place] [facility] [place] [facility] [place] [facility] [place] [facility]         Specimen Collected: 01/25/23 14:57 Last Resulted: 01/25/23 15:19

## 2023-02-22 ENCOUNTER — Inpatient Hospital Stay: Payer: PPO | Attending: Internal Medicine

## 2023-02-22 ENCOUNTER — Inpatient Hospital Stay: Payer: PPO

## 2023-02-22 VITALS — BP 122/81 | HR 90

## 2023-02-22 DIAGNOSIS — D751 Secondary polycythemia: Secondary | ICD-10-CM

## 2023-02-22 DIAGNOSIS — D45 Polycythemia vera: Secondary | ICD-10-CM | POA: Diagnosis not present

## 2023-02-22 LAB — HEMOGLOBIN AND HEMATOCRIT, BLOOD
HCT: 45.9 % (ref 39.0–52.0)
Hemoglobin: 14.1 g/dL (ref 13.0–17.0)

## 2023-03-26 ENCOUNTER — Inpatient Hospital Stay: Payer: PPO

## 2023-03-26 ENCOUNTER — Inpatient Hospital Stay: Payer: PPO | Attending: Internal Medicine

## 2023-03-26 DIAGNOSIS — D45 Polycythemia vera: Secondary | ICD-10-CM | POA: Diagnosis not present

## 2023-03-26 LAB — HEMOGLOBIN AND HEMATOCRIT, BLOOD
HCT: 41.7 % (ref 39.0–52.0)
Hemoglobin: 12.6 g/dL — ABNORMAL LOW (ref 13.0–17.0)

## 2023-03-26 NOTE — Progress Notes (Signed)
Hct 41.7. No phlebotomy needed today

## 2023-04-03 DIAGNOSIS — L578 Other skin changes due to chronic exposure to nonionizing radiation: Secondary | ICD-10-CM | POA: Diagnosis not present

## 2023-04-03 DIAGNOSIS — L570 Actinic keratosis: Secondary | ICD-10-CM | POA: Diagnosis not present

## 2023-04-03 DIAGNOSIS — Z08 Encounter for follow-up examination after completed treatment for malignant neoplasm: Secondary | ICD-10-CM | POA: Diagnosis not present

## 2023-04-03 DIAGNOSIS — Z85828 Personal history of other malignant neoplasm of skin: Secondary | ICD-10-CM | POA: Diagnosis not present

## 2023-04-23 DIAGNOSIS — L718 Other rosacea: Secondary | ICD-10-CM | POA: Diagnosis not present

## 2023-04-23 DIAGNOSIS — L578 Other skin changes due to chronic exposure to nonionizing radiation: Secondary | ICD-10-CM | POA: Diagnosis not present

## 2023-04-25 ENCOUNTER — Inpatient Hospital Stay: Payer: PPO

## 2023-04-25 ENCOUNTER — Inpatient Hospital Stay: Payer: PPO | Attending: Internal Medicine

## 2023-04-25 DIAGNOSIS — D45 Polycythemia vera: Secondary | ICD-10-CM | POA: Insufficient documentation

## 2023-04-25 LAB — HEMOGLOBIN AND HEMATOCRIT, BLOOD
HCT: 42.6 % (ref 39.0–52.0)
Hemoglobin: 12.9 g/dL — ABNORMAL LOW (ref 13.0–17.0)

## 2023-04-25 NOTE — Progress Notes (Signed)
Hct 42.6 today. No phlebotomy needed

## 2023-05-19 ENCOUNTER — Emergency Department: Payer: PPO

## 2023-05-19 ENCOUNTER — Other Ambulatory Visit: Payer: Self-pay

## 2023-05-19 ENCOUNTER — Emergency Department
Admission: EM | Admit: 2023-05-19 | Discharge: 2023-05-19 | Disposition: A | Discharge status: Home / Self Care | Payer: PPO | Attending: Emergency Medicine | Admitting: Emergency Medicine

## 2023-05-19 DIAGNOSIS — I10 Essential (primary) hypertension: Secondary | ICD-10-CM | POA: Diagnosis not present

## 2023-05-19 DIAGNOSIS — R600 Localized edema: Secondary | ICD-10-CM | POA: Diagnosis not present

## 2023-05-19 DIAGNOSIS — M79605 Pain in left leg: Secondary | ICD-10-CM | POA: Diagnosis not present

## 2023-05-19 DIAGNOSIS — M7989 Other specified soft tissue disorders: Secondary | ICD-10-CM | POA: Diagnosis not present

## 2023-05-19 LAB — CBC
HCT: 46.4 % (ref 39.0–52.0)
Hemoglobin: 13.6 g/dL (ref 13.0–17.0)
MCH: 27 pg (ref 26.0–34.0)
MCHC: 29.3 g/dL — ABNORMAL LOW (ref 30.0–36.0)
MCV: 92.2 fL (ref 80.0–100.0)
Platelets: 233 10*3/uL (ref 150–400)
RBC: 5.03 MIL/uL (ref 4.22–5.81)
RDW: 16.2 % — ABNORMAL HIGH (ref 11.5–15.5)
WBC: 12.7 10*3/uL — ABNORMAL HIGH (ref 4.0–10.5)
nRBC: 0 % (ref 0.0–0.2)

## 2023-05-19 LAB — COMPREHENSIVE METABOLIC PANEL
ALT: 15 U/L (ref 0–44)
AST: 23 U/L (ref 15–41)
Albumin: 4.1 g/dL (ref 3.5–5.0)
Alkaline Phosphatase: 90 U/L (ref 38–126)
Anion gap: 8 (ref 5–15)
BUN: 9 mg/dL (ref 8–23)
CO2: 25 mmol/L (ref 22–32)
Calcium: 8.8 mg/dL — ABNORMAL LOW (ref 8.9–10.3)
Chloride: 110 mmol/L (ref 98–111)
Creatinine, Ser: 0.83 mg/dL (ref 0.61–1.24)
GFR, Estimated: >60 mL/min (ref >60)
Glucose, Bld: 139 mg/dL — ABNORMAL HIGH (ref 70–99)
Potassium: 4.1 mmol/L (ref 3.5–5.1)
Sodium: 143 mmol/L (ref 135–145)
Total Bilirubin: 0.8 mg/dL (ref 0.3–1.2)
Total Protein: 7 g/dL (ref 6.5–8.1)

## 2023-05-19 LAB — PROTIME-INR
INR: 1.2 (ref 0.8–1.2)
Prothrombin Time: 15.1 seconds (ref 11.4–15.2)

## 2023-05-19 MED ORDER — MELOXICAM 7.5 MG PO TABS: 7.5000 mg | ORAL_TABLET | Freq: Every day | ORAL | 0 refills | Status: AC

## 2023-05-19 NOTE — ED Provider Notes (Signed)
[facility] Provider Note    Event Date/Time   First MD Initiated Contact with Patient 05/19/23 1910     (approximate)   History   Leg Swelling   HPI  Derek [name] is a 67 y.o. male with a history of polycythemia vera, hypertension, GERD and as listed in EMR presents to the emergency department for treatment and evaluation of pain in the left lower extremity.  He is concerned for DVT.  No personal history of DVT.  He got overheated few days ago and did not feel well the day after.  Left lower extremity has swollen like this in the past and he was evaluated by orthopedics who did not provide a specific diagnosis.     Physical Exam   Triage Vital Signs: ED Triage Vitals  Enc Vitals Group     BP 05/19/23 1335 (!) 156/90     Pulse Rate 05/19/23 1335 95     Resp 05/19/23 1335 20     Temp 05/19/23 1335 97.8 F (36.6 C)     Temp Source 05/19/23 1335 Oral     SpO2 05/19/23 1335 97 %     Weight 05/19/23 1331 196 lb 3.4 oz (89 kg)     Height 05/19/23 1331 5\' 8"  (1.727 m)     Head Circumference --      Peak Flow --      Pain Score 05/19/23 1331 2     Pain Loc --      Pain Edu? --      Excl. in GC? --     Most recent vital signs: Vitals:   05/19/23 1802 05/19/23 1940  BP: (!) 177/86 (!) 141/90  Pulse: 91 77  Resp: 18 18  Temp: 98.6 F (37 C) 98.2 F (36.8 C)  SpO2: 98% 97%    General: Awake, no distress.  CV:  Good peripheral perfusion.  Resp:  Normal effort.  Abd:  No distention.  Other:  Nonpitting edema of the left lower extremity from the knee to the foot.   ED Results / Procedures / Treatments   Labs (all labs ordered are listed, but only abnormal results are displayed) Labs Reviewed  COMPREHENSIVE METABOLIC PANEL - Abnormal; Notable for the following components:      Result Value   Glucose, Bld 139 (*)    Calcium 8.8 (*)    All other components within normal limits  CBC - Abnormal; Notable for the following components:   WBC  12.7 (*)    MCHC 29.3 (*)    RDW 16.2 (*)    All other components within normal limits  PROTIME-INR     EKG   RADIOLOGY  Image and radiology report reviewed and interpreted by me. Radiology report consistent with the same.  US venous doppler of the left lower extremity negative for DVT.  PROCEDURES:  Critical Care performed: No  Procedures   MEDICATIONS ORDERED IN ED:  Medications - No data to display   IMPRESSION / MDM / ASSESSMENT AND PLAN / ED COURSE   I have reviewed the triage note.  Differential diagnosis includes, but is not limited to, DVT, peripheral vascular disease  Patient's presentation is most consistent with acute complicated illness / injury requiring diagnostic workup.  67 year old male presenting to the emergency department for treatment and evaluation of swelling to the left lower extremity.  See HPI for further details.  On exam he does have nonpitting edema from the knee to the foot only  on the left.  Ultrasound negative for DVT.  Lab studies are reassuring.  Plan will be to have him take a low-dose meloxicam for pain and wear compression stocking.  He is to call and schedule follow-up appointment with his primary care provider.  ER return precautions discussed.      FINAL CLINICAL IMPRESSION(S) / ED DIAGNOSES   Final diagnoses:  Peripheral edema     Rx / DC Orders   ED Discharge Orders          Ordered    meloxicam (MOBIC) 7.5 MG tablet  Daily        05/19/23 1928    Compression stockings        05/19/23 1930             Note:  This document was prepared using Dragon voice recognition software and may include unintentional dictation errors.   Chinita Pester, FNP 05/19/23 2338    Merwyn Katos, MD 05/20/23 1539

## 2023-05-19 NOTE — Discharge Instructions (Addendum)
Please rest and elevate your left leg as often as possible.  Wear compression stockings to help with swelling.

## 2023-05-19 NOTE — ED Triage Notes (Signed)
Pt reports for over a week she has had some discomfort and redness and swelling to his left leg. Pt reports concern for DVT. Pt reports has thick blood and has to get his labs checked and then blood drawn to decrease if counts.

## 2023-05-20 DIAGNOSIS — M170 Bilateral primary osteoarthritis of knee: Secondary | ICD-10-CM | POA: Diagnosis not present

## 2023-05-24 ENCOUNTER — Inpatient Hospital Stay: Payer: PPO | Attending: Internal Medicine

## 2023-05-24 ENCOUNTER — Inpatient Hospital Stay (HOSPITAL_BASED_OUTPATIENT_CLINIC_OR_DEPARTMENT_OTHER): Payer: PPO | Admitting: Internal Medicine

## 2023-05-24 ENCOUNTER — Inpatient Hospital Stay: Payer: PPO

## 2023-05-24 ENCOUNTER — Encounter: Payer: Self-pay | Admitting: Internal Medicine

## 2023-05-24 VITALS — BP 133/81 | HR 85 | Temp 98.2°F | Ht 68.0 in | Wt 201.0 lb

## 2023-05-24 VITALS — BP 143/85 | HR 88

## 2023-05-24 DIAGNOSIS — D45 Polycythemia vera: Secondary | ICD-10-CM

## 2023-05-24 DIAGNOSIS — D751 Secondary polycythemia: Secondary | ICD-10-CM

## 2023-05-24 DIAGNOSIS — R161 Splenomegaly, not elsewhere classified: Secondary | ICD-10-CM | POA: Insufficient documentation

## 2023-05-24 LAB — CBC WITH DIFFERENTIAL/PLATELET
Abs Immature Granulocytes: 0.31 10*3/uL — ABNORMAL HIGH (ref 0.00–0.07)
Basophils Absolute: 0.1 10*3/uL (ref 0.0–0.1)
Basophils Relative: 1 %
Eosinophils Absolute: 0.1 10*3/uL (ref 0.0–0.5)
Eosinophils Relative: 1 %
HCT: 43.8 % (ref 39.0–52.0)
Hemoglobin: 13.3 g/dL (ref 13.0–17.0)
Immature Granulocytes: 2 %
Lymphocytes Relative: 4 %
Lymphs Abs: 0.7 10*3/uL (ref 0.7–4.0)
MCH: 27.3 pg (ref 26.0–34.0)
MCHC: 30.4 g/dL (ref 30.0–36.0)
MCV: 89.8 fL (ref 80.0–100.0)
Monocytes Absolute: 0.4 10*3/uL (ref 0.1–1.0)
Monocytes Relative: 3 %
Neutro Abs: 15.3 10*3/uL — ABNORMAL HIGH (ref 1.7–7.7)
Neutrophils Relative %: 89 %
Platelets: 274 10*3/uL (ref 150–400)
RBC: 4.88 MIL/uL (ref 4.22–5.81)
RDW: 16.1 % — ABNORMAL HIGH (ref 11.5–15.5)
WBC: 17 10*3/uL — ABNORMAL HIGH (ref 4.0–10.5)
nRBC: 0.1 % (ref 0.0–0.2)

## 2023-05-24 LAB — COMPREHENSIVE METABOLIC PANEL
ALT: 16 U/L (ref 0–44)
AST: 17 U/L (ref 15–41)
Albumin: 4 g/dL (ref 3.5–5.0)
Alkaline Phosphatase: 97 U/L (ref 38–126)
Anion gap: 7 (ref 5–15)
BUN: 14 mg/dL (ref 8–23)
CO2: 26 mmol/L (ref 22–32)
Calcium: 8.6 mg/dL — ABNORMAL LOW (ref 8.9–10.3)
Chloride: 104 mmol/L (ref 98–111)
Creatinine, Ser: 0.9 mg/dL (ref 0.61–1.24)
GFR, Estimated: >60 mL/min (ref >60)
Glucose, Bld: 151 mg/dL — ABNORMAL HIGH (ref 70–99)
Potassium: 3.8 mmol/L (ref 3.5–5.1)
Sodium: 137 mmol/L (ref 135–145)
Total Bilirubin: 0.9 mg/dL (ref 0.3–1.2)
Total Protein: 6.8 g/dL (ref 6.5–8.1)

## 2023-05-24 LAB — LACTATE DEHYDROGENASE: LDH: 194 U/L — ABNORMAL HIGH (ref 98–192)

## 2023-05-24 NOTE — Assessment & Plan Note (Addendum)
#   Polycythemia vera Jak 2+; on Hydrea 500 mg twice a day; except Saturday Sunday-extra pill. Stable. June 2021-bone marrow biopsy persistent myeloproliferative disease; no evidence of transformation or leukemia. MAY 2023-ultrasound: Splenomegaly with a volume 1097 mL, compared to 1319 prior study from 01/22/2022. Stable. Will repeat US in 3 months.   # Today-hematocrit is  43.6 proceed with phlebotomy today. Continue Hydrea.  # Mild Leucocytosis- Neutrophilia- 17; sec to steroid injection; monitor for now.   # Left LE swelling- June 2024- ]No DVT identified.? Bakers cyst 1.8 x 0.5 x 4.5 cm- s/p steroid injection [[facility]]- improved. S/p meloxicam-   #Disposition:  # proceed with Phlebotomy today # in 1 months-H&H; possible phlebotomy. # in 4months-H&H; possible phlebotomy. # in 3 months- MD; cbc/cmp/LDH-possible phlebotomy; US spleen ;---Dr.B

## 2023-05-24 NOTE — Progress Notes (Signed)
No concerns today 

## 2023-05-24 NOTE — Progress Notes (Signed)
[facility] OFFICE PROGRESS NOTE  Patient Care Team: Marisue Ivan, MD as PCP - General (Family Medicine) Earna Coder, MD as Consulting Physician (Internal Medicine)   Cancer Staging  No matching staging information was found for the patient.   Oncology History Overview Note  # 2014- POLYCYTHEMIA VERA - JAK-2 Positive; on Hydrea 500mg /day; aspirin 81 mg a day; phlebotomy for Hct >43 [headaches];   # MAY 2019- BMBx- pan-hypercellular; no evidence of significant fibrosis; MAY [place]- 1389 cc/ splenomegaly; Oct 11th 2019- Increase in splenomegaly; Increased Hydrea 1500mg /day.   # nonarteritic ischemic optic neuropathy-Left eye [may 2018; Dr.Dingledein/Dr.Sitko at [facility]]; Previous Right eye ---------------------------------------    DIAGNOSIS: [ ]  POLYCYTHEMIA VERA  ;GOALS: control  CURRENT/MOST RECENT THERAPY [ ]  Hydrea        Polycythemia vera (HCC)      INTERVAL HISTORY: ambulating independently; alone.   Derek [name] 67 y.o.  male pleasant patient above history of polycythemia vera on hydrea is here for follow-up.  Patient denies any abdominal pain or early satiety.  Denies any weight loss.  Denies any night sweats.  No fatigue.  In the interim evaluated in the emergency room for leg swelling.  Noted to have arthritis s/p steroid injections.  No new symptoms.  No nausea no vomiting no weight loss no night sweats. Denies any fevers.  Patient admits compliance to Hydrea.  Review of Systems  Constitutional:  Negative for chills, diaphoresis and weight loss.  HENT:  Negative for nosebleeds and sore throat.   Eyes:  Negative for double vision.  Respiratory:  Negative for cough, hemoptysis, sputum production, shortness of breath and wheezing.   Cardiovascular:  Negative for chest pain, palpitations, orthopnea and leg swelling.  Gastrointestinal:  Negative for blood in stool, constipation, diarrhea, heartburn, melena, nausea and vomiting.   Genitourinary:  Negative for dysuria, frequency and urgency.  Musculoskeletal:  Negative for back pain and joint pain.  Skin: Negative.  Negative for itching and rash.  Neurological:  Negative for dizziness, tingling, focal weakness, weakness and headaches.  Endo/Heme/Allergies:  Does not bruise/bleed easily.  Psychiatric/Behavioral:  Negative for depression. The patient is not nervous/anxious and does not have insomnia.       PAST MEDICAL HISTORY :  Past Medical History:  Diagnosis Date   Arthritis    GERD (gastroesophageal reflux disease)    Gout    Low serum testosterone level    Polycythemia vera (HCC)    Polycythemia, secondary 06/08/2015   Pulmonary nodules    Thoracic spine fracture (HCC)    compression fracture following a fall    PAST SURGICAL HISTORY :   Past Surgical History:  Procedure Laterality Date   COLONOSCOPY  2013    FAMILY HISTORY :   Family History  Problem Relation Age of Onset   Throat cancer Other        uncle   Migraines Neg Hx    Multiple sclerosis Neg Hx    Neurofibromatosis Neg Hx    Parkinsonism Neg Hx    Seizures Neg Hx    Neuropathy Neg Hx     SOCIAL HISTORY:   Social History   Tobacco Use   Smoking status: Never   Smokeless tobacco: Never  Vaping Use   Vaping Use: Never used  Substance Use Topics   Alcohol use: Yes    Alcohol/week: 0.0 standard drinks of alcohol    Comment: occasional alcohol use   Drug use: No    ALLERGIES:  is allergic  to penicillin g.  MEDICATIONS:  Current Outpatient Medications  Medication Sig Dispense Refill   acetaminophen (TYLENOL) 500 MG tablet Take 500 mg by mouth every 6 (six) hours as needed for fever.     amLODipine (NORVASC) 5 MG tablet Take 5 mg daily by mouth.  11   aspirin EC 81 MG tablet Take 81 mg daily by mouth.      Cholecalciferol 25 MCG (1000 UT) tablet Take 1,000 Units daily by mouth.      Esomeprazole Magnesium (NEXIUM PO) Take 2 capsules by mouth daily.      hydroxyurea  (HYDREA) 500 MG capsule TAKE 1 CAPSULE BY MOUTH TWICE A DAY EXCEPT SATURDAY AND SUNDAY (TAKE AN EXTRA PILL ON SAT/SUNDAY) MAY TAKE WITH FOOD TO MINIMIZE GI SIDE EFFECTS. 210 capsule 1   meloxicam (MOBIC) 7.5 MG tablet Take 1 tablet (7.5 mg total) by mouth daily. 30 tablet 0   Multiple Vitamins-Minerals (MULTIVITAMIN ADULT PO) Take 1 tablet by mouth 1 day or 1 dose.     econazole nitrate 1 % cream Apply topically 2 (two) times daily. (Patient not taking: Reported on 01/30/2022)     ondansetron (ZOFRAN ODT) 4 MG disintegrating tablet Take 1 tablet (4 mg total) by mouth every 8 (eight) hours as needed for nausea or vomiting. (Patient not taking: Reported on 07/31/2021) 20 tablet 0   No current facility-administered medications for this visit.    PHYSICAL EXAMINATION: ECOG PERFORMANCE STATUS: 0 - Asymptomatic  BP 133/81 (BP Location: Left Arm, Patient Position: Sitting, Cuff Size: Normal)   Pulse 85   Temp 98.2 F (36.8 C) (Tympanic)   Ht 5\' 8"  (1.727 m)   Wt 201 lb (91.2 kg)   SpO2 98%   BMI 30.56 kg/m   Filed Weights   05/24/23 1258  Weight: 201 lb (91.2 kg)   Positive for splenomegaly.   Physical Exam HENT:     Head: Normocephalic and atraumatic.     Mouth/Throat:     Pharynx: No oropharyngeal exudate.  Eyes:     Pupils: Pupils are equal, round, and reactive to light.  Cardiovascular:     Rate and Rhythm: Normal rate and regular rhythm.  Pulmonary:     Effort: No respiratory distress.     Breath sounds: No wheezing.  Abdominal:     General: Bowel sounds are normal. There is no distension.     Palpations: Abdomen is soft. There is no mass.     Tenderness: There is no abdominal tenderness. There is no guarding or rebound.  Musculoskeletal:        General: No tenderness. Normal range of motion.     Cervical back: Normal range of motion and neck supple.  Skin:    General: Skin is warm.  Neurological:     Mental Status: He is alert and oriented to person, place, and time.   Psychiatric:        Mood and Affect: Affect normal.     LABORATORY DATA:  I have reviewed the data as listed    Component Value Date/Time   NA 137 05/24/2023 1303   NA 140 03/02/2014 0835   K 3.8 05/24/2023 1303   K 3.8 03/02/2014 0835   CL 104 05/24/2023 1303   CL 109 (H) 03/02/2014 0835   CO2 26 05/24/2023 1303   CO2 26 03/02/2014 0835   GLUCOSE 151 (H) 05/24/2023 1303   GLUCOSE 108 (H) 03/02/2014 0835   BUN 14 05/24/2023 1303   BUN 9 03/02/2014  0835   CREATININE 0.90 05/24/2023 1303   CREATININE 0.99 03/02/2014 0835   CALCIUM 8.6 (L) 05/24/2023 1303   CALCIUM 8.6 03/02/2014 0835   PROT 6.8 05/24/2023 1303   PROT 7.0 03/02/2014 0835   ALBUMIN 4.0 05/24/2023 1303   ALBUMIN 3.7 03/02/2014 0835   AST 17 05/24/2023 1303   AST 20 03/02/2014 0835   ALT 16 05/24/2023 1303   ALT 24 03/02/2014 0835   ALKPHOS 97 05/24/2023 1303   ALKPHOS 96 03/02/2014 0835   BILITOT 0.9 05/24/2023 1303   BILITOT 0.6 03/02/2014 0835   GFRNONAA >60 05/24/2023 1303   GFRNONAA >60 03/02/2014 0835   GFRAA >60 08/15/2020 0953   GFRAA >60 03/02/2014 0835    No results found for: "SPEP", "UPEP"  Lab Results  Component Value Date   WBC 17.0 (H) 05/24/2023   NEUTROABS 15.3 (H) 05/24/2023   HGB 13.3 05/24/2023   HCT 43.8 05/24/2023   MCV 89.8 05/24/2023   PLT 274 05/24/2023      Chemistry      Component Value Date/Time   NA 137 05/24/2023 1303   NA 140 03/02/2014 0835   K 3.8 05/24/2023 1303   K 3.8 03/02/2014 0835   CL 104 05/24/2023 1303   CL 109 (H) 03/02/2014 0835   CO2 26 05/24/2023 1303   CO2 26 03/02/2014 0835   BUN 14 05/24/2023 1303   BUN 9 03/02/2014 0835   CREATININE 0.90 05/24/2023 1303   CREATININE 0.99 03/02/2014 0835      Component Value Date/Time   CALCIUM 8.6 (L) 05/24/2023 1303   CALCIUM 8.6 03/02/2014 0835   ALKPHOS 97 05/24/2023 1303   ALKPHOS 96 03/02/2014 0835   AST 17 05/24/2023 1303   AST 20 03/02/2014 0835   ALT 16 05/24/2023 1303   ALT 24  03/02/2014 0835   BILITOT 0.9 05/24/2023 1303   BILITOT 0.6 03/02/2014 0835       RADIOGRAPHIC STUDIES: I have personally reviewed the radiological images as listed and agreed with the findings in the report. No results found.   ASSESSMENT & PLAN:  Polycythemia vera (HCC) # Polycythemia vera Jak 2+; on Hydrea 500 mg twice a day; except Saturday Sunday-extra pill. Stable. June 2021-bone marrow biopsy persistent myeloproliferative disease; no evidence of transformation or leukemia. MAY 2023-ultrasound: Splenomegaly with a volume 1097 mL, compared to 1319 prior study from 01/22/2022. Stable. Will repeat US in 3 months.   # Today-hematocrit is  43.6 proceed with phlebotomy today. Continue Hydrea.  # Mild Leucocytosis- Neutrophilia- 17; sec to steroid injection; monitor for now.   # Left LE swelling- June 2024- ]No DVT identified.? Bakers cyst 1.8 x 0.5 x 4.5 cm- s/p steroid injection [[facility]]- improved. S/p meloxicam-   #Disposition:  # proceed with Phlebotomy today # in 1 months-H&H; possible phlebotomy. # in 77months-H&H; possible phlebotomy. # in 3 months- MD; cbc/cmp/LDH-possible phlebotomy; US spleen ;---Dr.B   Orders Placed This Encounter  Procedures   US SPLEEN (ABDOMEN LIMITED)    Standing Status:   Future    Standing Expiration Date:   05/23/2024    Order Specific Question:   Reason for Exam (SYMPTOM  OR DIAGNOSIS REQUIRED)    Answer:   splneomegaly.    Order Specific Question:   Preferred imaging location?    Answer:   [facility]    All questions were answered. The patient knows to call the clinic with any problems, questions or concerns.      Earna Coder,  MD 05/24/2023 1:54 PM

## 2023-06-24 ENCOUNTER — Inpatient Hospital Stay: Payer: PPO | Attending: Internal Medicine

## 2023-06-24 ENCOUNTER — Inpatient Hospital Stay: Payer: PPO

## 2023-06-24 VITALS — BP 135/81 | HR 103 | Temp 97.9°F

## 2023-06-24 DIAGNOSIS — D751 Secondary polycythemia: Secondary | ICD-10-CM

## 2023-06-24 DIAGNOSIS — D45 Polycythemia vera: Secondary | ICD-10-CM | POA: Insufficient documentation

## 2023-06-24 DIAGNOSIS — R161 Splenomegaly, not elsewhere classified: Secondary | ICD-10-CM

## 2023-06-24 LAB — HEMOGLOBIN AND HEMATOCRIT (CANCER CENTER ONLY)
HCT: 47.8 % (ref 39.0–52.0)
Hemoglobin: 14.2 g/dL (ref 13.0–17.0)

## 2023-07-08 DIAGNOSIS — I10 Essential (primary) hypertension: Secondary | ICD-10-CM | POA: Diagnosis not present

## 2023-07-08 DIAGNOSIS — E781 Pure hyperglyceridemia: Secondary | ICD-10-CM | POA: Diagnosis not present

## 2023-07-15 DIAGNOSIS — Z87898 Personal history of other specified conditions: Secondary | ICD-10-CM | POA: Insufficient documentation

## 2023-07-15 DIAGNOSIS — D45 Polycythemia vera: Secondary | ICD-10-CM | POA: Diagnosis not present

## 2023-07-15 DIAGNOSIS — Z0000 Encounter for general adult medical examination without abnormal findings: Secondary | ICD-10-CM | POA: Diagnosis not present

## 2023-07-15 DIAGNOSIS — D751 Secondary polycythemia: Secondary | ICD-10-CM | POA: Diagnosis not present

## 2023-07-15 DIAGNOSIS — E781 Pure hyperglyceridemia: Secondary | ICD-10-CM | POA: Diagnosis not present

## 2023-07-15 DIAGNOSIS — I10 Essential (primary) hypertension: Secondary | ICD-10-CM | POA: Diagnosis not present

## 2023-07-15 DIAGNOSIS — L739 Follicular disorder, unspecified: Secondary | ICD-10-CM | POA: Diagnosis not present

## 2023-07-24 ENCOUNTER — Inpatient Hospital Stay: Payer: PPO

## 2023-07-24 ENCOUNTER — Inpatient Hospital Stay: Payer: PPO | Attending: Internal Medicine

## 2023-07-24 DIAGNOSIS — R161 Splenomegaly, not elsewhere classified: Secondary | ICD-10-CM

## 2023-07-24 DIAGNOSIS — D45 Polycythemia vera: Secondary | ICD-10-CM | POA: Diagnosis not present

## 2023-07-24 DIAGNOSIS — D751 Secondary polycythemia: Secondary | ICD-10-CM

## 2023-07-24 LAB — LACTATE DEHYDROGENASE: LDH: 181 U/L (ref 98–192)

## 2023-07-24 LAB — CBC WITH DIFFERENTIAL (CANCER CENTER ONLY)
Abs Immature Granulocytes: 0.21 10*3/uL — ABNORMAL HIGH (ref 0.00–0.07)
Basophils Absolute: 0.2 10*3/uL — ABNORMAL HIGH (ref 0.0–0.1)
Basophils Relative: 1 %
Eosinophils Absolute: 0.1 10*3/uL (ref 0.0–0.5)
Eosinophils Relative: 1 %
HCT: 40.8 % (ref 39.0–52.0)
Hemoglobin: 12.1 g/dL — ABNORMAL LOW (ref 13.0–17.0)
Immature Granulocytes: 2 %
Lymphocytes Relative: 8 %
Lymphs Abs: 1 10*3/uL (ref 0.7–4.0)
MCH: 26.4 pg (ref 26.0–34.0)
MCHC: 29.7 g/dL — ABNORMAL LOW (ref 30.0–36.0)
MCV: 88.9 fL (ref 80.0–100.0)
Monocytes Absolute: 0.2 10*3/uL (ref 0.1–1.0)
Monocytes Relative: 2 %
Neutro Abs: 10.9 10*3/uL — ABNORMAL HIGH (ref 1.7–7.7)
Neutrophils Relative %: 86 %
Platelet Count: 255 10*3/uL (ref 150–400)
RBC: 4.59 MIL/uL (ref 4.22–5.81)
RDW: 15.7 % — ABNORMAL HIGH (ref 11.5–15.5)
WBC Count: 12.6 10*3/uL — ABNORMAL HIGH (ref 4.0–10.5)
nRBC: 0 % (ref 0.0–0.2)

## 2023-07-24 LAB — CMP (CANCER CENTER ONLY)
ALT: 15 U/L (ref 0–44)
AST: 20 U/L (ref 15–41)
Albumin: 4.2 g/dL (ref 3.5–5.0)
Alkaline Phosphatase: 90 U/L (ref 38–126)
Anion gap: 8 (ref 5–15)
BUN: 13 mg/dL (ref 8–23)
CO2: 23 mmol/L (ref 22–32)
Calcium: 8.8 mg/dL — ABNORMAL LOW (ref 8.9–10.3)
Chloride: 106 mmol/L (ref 98–111)
Creatinine: 0.81 mg/dL (ref 0.61–1.24)
GFR, Estimated: >60 mL/min (ref >60)
Glucose, Bld: 120 mg/dL — ABNORMAL HIGH (ref 70–99)
Potassium: 3.4 mmol/L — ABNORMAL LOW (ref 3.5–5.1)
Sodium: 137 mmol/L (ref 135–145)
Total Bilirubin: 0.9 mg/dL (ref 0.3–1.2)
Total Protein: 6.5 g/dL (ref 6.5–8.1)

## 2023-07-24 NOTE — Progress Notes (Signed)
Hct 40.8. No phlebotomy today

## 2023-08-08 ENCOUNTER — Other Ambulatory Visit: Payer: Self-pay | Admitting: Internal Medicine

## 2023-08-08 DIAGNOSIS — B354 Tinea corporis: Secondary | ICD-10-CM | POA: Diagnosis not present

## 2023-08-08 DIAGNOSIS — Z85828 Personal history of other malignant neoplasm of skin: Secondary | ICD-10-CM | POA: Diagnosis not present

## 2023-08-08 DIAGNOSIS — D45 Polycythemia vera: Secondary | ICD-10-CM

## 2023-08-08 DIAGNOSIS — D225 Melanocytic nevi of trunk: Secondary | ICD-10-CM | POA: Diagnosis not present

## 2023-08-08 DIAGNOSIS — L720 Epidermal cyst: Secondary | ICD-10-CM | POA: Diagnosis not present

## 2023-08-08 DIAGNOSIS — D2261 Melanocytic nevi of right upper limb, including shoulder: Secondary | ICD-10-CM | POA: Diagnosis not present

## 2023-08-08 DIAGNOSIS — D2262 Melanocytic nevi of left upper limb, including shoulder: Secondary | ICD-10-CM | POA: Diagnosis not present

## 2023-08-08 DIAGNOSIS — D2271 Melanocytic nevi of right lower limb, including hip: Secondary | ICD-10-CM | POA: Diagnosis not present

## 2023-08-08 DIAGNOSIS — D2272 Melanocytic nevi of left lower limb, including hip: Secondary | ICD-10-CM | POA: Diagnosis not present

## 2023-08-08 NOTE — Telephone Encounter (Signed)
CBC with Differential (Cancer Center Only) Order: [id number] Status: Final result     Visible to patient: Yes (seen)     Next appt: 08/15/2023 at 08:45 AM in Radiology ([facility])     Dx: Polycythemia, secondary; Splenomegaly...   0 Result Notes          Component Ref Range & Units 2 wk ago (07/24/23) 1 mo ago (06/24/23) 2 mo ago (05/24/23) 2 mo ago (05/19/23) 3 mo ago (04/25/23) 4 mo ago (03/26/23) 5 mo ago (02/22/23)  WBC Count 4.0 - 10.5 K/uL 12.6 High   17.0 High  12.7 High      RBC 4.22 - 5.81 MIL/uL 4.59  4.88 5.03     Hemoglobin 13.0 - 17.0 g/dL 13.2 Low  44.0 10.2 72.5 12.9 Low  12.6 Low  14.1  HCT 39.0 - 52.0 % 40.8 47.8 CM 43.8 46.4 42.6 CM 41.7 CM 45.9 CM  MCV 80.0 - 100.0 fL 88.9  89.8 92.2     MCH 26.0 - 34.0 pg 26.4  27.3 27.0     MCHC 30.0 - 36.0 g/dL 36.6 Low   44.0 34.7 Low      RDW 11.5 - 15.5 % 15.7 High   16.1 High  16.2 High      Platelet Count 150 - 400 K/uL 255  274 233     nRBC 0.0 - 0.2 % 0.0  0.1 0.0 CM     Neutrophils Relative % % 86  89      Neutro Abs 1.7 - 7.7 K/uL 10.9 High   15.3 High       Lymphocytes Relative % 8  4      Lymphs Abs 0.7 - 4.0 K/uL 1.0  0.7      Monocytes Relative % 2  3      Monocytes Absolute 0.1 - 1.0 K/uL 0.2  0.4      Eosinophils Relative % 1  1      Eosinophils Absolute 0.0 - 0.5 K/uL 0.1  0.1      Basophils Relative % 1  1      Basophils Absolute 0.0 - 0.1 K/uL 0.2 High   0.1      Immature Granulocytes % 2  2      Abs Immature Granulocytes 0.00 - 0.07 K/uL 0.21 High   0.31 High  CM      Comment: Performed at [facility], [address] [place], [place] [address]  Resulting Agency [facility]         Specimen Collected: 07/24/23 14:29 Last Resulted: 07/24/23 14:51      Lab Flowsheet      Order Details      View Encounter      Lab and Collection Details      Routing      Result History    View All Conversations on this  Encounter      CM=Additional comments      Result Care Coordination   Patient Communication   Add Comments   Seen Back to Top    Other Results from 07/24/2023  Lactate dehydrogenase Order: [id number] Status: Final result      Visible to patient: Yes (seen)      Next appt: 08/15/2023 at 08:45 AM in Radiology ([facility])      Dx: Polycythemia, secondary; Splenomegaly...    0 Result Notes  Component Ref Range & Units 2 wk ago (07/24/23) 2 mo ago (05/24/23) 6 mo ago (01/25/23) 9 mo ago (11/02/22) 11 mo ago (09/03/22) 1 yr ago (08/03/22) 1 yr ago (05/01/22)  LDH 98 - 192 U/L 181 194 High  CM 181 CM 190 CM 189 CM 151 CM 169 CM  Comment: Performed at [facility], [address] [place], [place] [address]  Resulting Agency [facility]         Specimen Collected: 07/24/23 14:29 Last Resulted: 07/24/23 15:00      Lab Flowsheet       Order Details       View Encounter       Lab and Collection Details       Routing       Result History     View All Conversations on this Encounter      CM=Additional comments      Result Care Coordination   Patient Communication   Add Comments   Seen Back to Top       Contains abnormal data CMP (Cancer Center only) Order: [id number] Status: Final result      Visible to patient: Yes (seen)      Next appt: 08/15/2023 at 08:45 AM in Radiology ([facility])      Dx: Polycythemia, secondary; Splenomegaly...    0 Result Notes            Component Ref Range & Units 2 wk ago (07/24/23) 2 mo ago (05/24/23) 2 mo ago (05/19/23) 6 mo ago (01/25/23) 9 mo ago (11/02/22) 11 mo ago (09/03/22) 1 yr ago (08/03/22)  Sodium 135 - 145 mmol/L 137 137 143 138 137 137 138  Potassium 3.5 - 5.1 mmol/L 3.4 Low  3.8 4.1 4.0 3.6 3.7 4.1  Chloride 98 - 111 mmol/L 106 104 110 102 104 104 105  CO2 22 - 32 mmol/L 23 26 25 28 24 27 28   Glucose, Bld 70 - 99  mg/dL 132 High  440 High  CM 139 High  CM 132 High  CM 124 High  CM 107 High  CM 109 High  CM  Comment: Glucose reference range applies only to samples taken after fasting for at least 8 hours.  BUN 8 - 23 mg/dL 13 14 9 9 9 10 9   Creatinine 0.61 - 1.24 mg/dL 1.02 7.25 3.66 4.40 3.47 0.81 0.89  Calcium 8.9 - 10.3 mg/dL 8.8 Low  8.6 Low  8.8 Low  8.7 Low  8.8 Low  8.8 Low  8.4 Low   Total Protein 6.5 - 8.1 g/dL 6.5 6.8 7.0 7.0 7.0 7.2 6.8  Albumin 3.5 - 5.0 g/dL 4.2 4.0 4.1 4.2 4.1 4.4 3.9  AST 15 - 41 U/L 20 17 23 21 25 22 19   ALT 0 - 44 U/L 15 16 15 15 18 14 13   Alkaline Phosphatase 38 - 126 U/L 90 97 90 92 95 88 79  Total Bilirubin 0.3 - 1.2 mg/dL 0.9 0.9 0.8 0.7 0.9 1.0 0.8  GFR, Estimated >60 mL/min >60        Comment: (NOTE) Calculated using the CKD-EPI Creatinine Equation (2021)  Anion gap 5 - 15 8 7  CM 8 CM 8 CM 9 CM 6 CM 5 CM  Comment: Performed at [facility], [address] [place], [place] [address]  Resulting Agency [facility]  [facility]         Specimen Collected: 07/24/23 14:29 Last Resulted: 07/24/23 15:03

## 2023-08-15 ENCOUNTER — Ambulatory Visit
Admission: RE | Admit: 2023-08-15 | Discharge: 2023-08-15 | Disposition: A | Discharge status: Home / Self Care | Payer: PPO | Source: Ambulatory Visit | Attending: Internal Medicine | Admitting: Internal Medicine

## 2023-08-15 DIAGNOSIS — D45 Polycythemia vera: Secondary | ICD-10-CM | POA: Diagnosis not present

## 2023-08-15 DIAGNOSIS — R161 Splenomegaly, not elsewhere classified: Secondary | ICD-10-CM | POA: Insufficient documentation

## 2023-08-21 ENCOUNTER — Other Ambulatory Visit: Payer: PPO

## 2023-08-21 ENCOUNTER — Ambulatory Visit: Payer: PPO | Admitting: Internal Medicine

## 2023-08-26 ENCOUNTER — Other Ambulatory Visit: Payer: Self-pay | Admitting: *Deleted

## 2023-08-26 DIAGNOSIS — D751 Secondary polycythemia: Secondary | ICD-10-CM

## 2023-08-27 ENCOUNTER — Inpatient Hospital Stay (HOSPITAL_BASED_OUTPATIENT_CLINIC_OR_DEPARTMENT_OTHER): Payer: PPO

## 2023-08-27 ENCOUNTER — Encounter: Payer: Self-pay | Admitting: Internal Medicine

## 2023-08-27 ENCOUNTER — Inpatient Hospital Stay: Payer: PPO | Attending: Internal Medicine

## 2023-08-27 ENCOUNTER — Inpatient Hospital Stay: Payer: PPO | Admitting: Internal Medicine

## 2023-08-27 VITALS — BP 138/79 | HR 90 | Temp 98.6°F | Resp 18 | Wt 194.4 lb

## 2023-08-27 DIAGNOSIS — Z7982 Long term (current) use of aspirin: Secondary | ICD-10-CM | POA: Insufficient documentation

## 2023-08-27 DIAGNOSIS — R161 Splenomegaly, not elsewhere classified: Secondary | ICD-10-CM | POA: Diagnosis not present

## 2023-08-27 DIAGNOSIS — D751 Secondary polycythemia: Secondary | ICD-10-CM

## 2023-08-27 DIAGNOSIS — D45 Polycythemia vera: Secondary | ICD-10-CM | POA: Insufficient documentation

## 2023-08-27 LAB — CMP (CANCER CENTER ONLY)
ALT: 16 U/L (ref 0–44)
AST: 21 U/L (ref 15–41)
Albumin: 4.2 g/dL (ref 3.5–5.0)
Alkaline Phosphatase: 82 U/L (ref 38–126)
Anion gap: 9 (ref 5–15)
BUN: 11 mg/dL (ref 8–23)
CO2: 25 mmol/L (ref 22–32)
Calcium: 8.8 mg/dL — ABNORMAL LOW (ref 8.9–10.3)
Chloride: 105 mmol/L (ref 98–111)
Creatinine: 0.81 mg/dL (ref 0.61–1.24)
GFR, Estimated: >60 mL/min (ref >60)
Glucose, Bld: 116 mg/dL — ABNORMAL HIGH (ref 70–99)
Potassium: 3.8 mmol/L (ref 3.5–5.1)
Sodium: 139 mmol/L (ref 135–145)
Total Bilirubin: 0.9 mg/dL (ref 0.3–1.2)
Total Protein: 6.7 g/dL (ref 6.5–8.1)

## 2023-08-27 LAB — CBC WITH DIFFERENTIAL (CANCER CENTER ONLY)
Abs Immature Granulocytes: 0.1 10*3/uL — ABNORMAL HIGH (ref 0.00–0.07)
Basophils Absolute: 0.1 10*3/uL (ref 0.0–0.1)
Basophils Relative: 1 %
Eosinophils Absolute: 0.2 10*3/uL (ref 0.0–0.5)
Eosinophils Relative: 2 %
HCT: 41.9 % (ref 39.0–52.0)
Hemoglobin: 12.3 g/dL — ABNORMAL LOW (ref 13.0–17.0)
Immature Granulocytes: 1 %
Lymphocytes Relative: 10 %
Lymphs Abs: 0.9 10*3/uL (ref 0.7–4.0)
MCH: 26.7 pg (ref 26.0–34.0)
MCHC: 29.4 g/dL — ABNORMAL LOW (ref 30.0–36.0)
MCV: 90.9 fL (ref 80.0–100.0)
Monocytes Absolute: 0.2 10*3/uL (ref 0.1–1.0)
Monocytes Relative: 2 %
Neutro Abs: 8.1 10*3/uL — ABNORMAL HIGH (ref 1.7–7.7)
Neutrophils Relative %: 84 %
Platelet Count: 189 10*3/uL (ref 150–400)
RBC: 4.61 MIL/uL (ref 4.22–5.81)
RDW: 15.9 % — ABNORMAL HIGH (ref 11.5–15.5)
WBC Count: 9.7 10*3/uL (ref 4.0–10.5)
nRBC: 0 % (ref 0.0–0.2)

## 2023-08-27 NOTE — Progress Notes (Signed)
Pt doing well. No headaches. Energy is good. Appetite is normal. Bowels normal. Taking hydrea, no side effects noted.

## 2023-08-27 NOTE — Progress Notes (Signed)
[facility] OFFICE PROGRESS NOTE  Patient Care Team: Marisue Ivan, MD as PCP - General (Family Medicine) Earna Coder, MD as Consulting Physician (Internal Medicine)   Cancer Staging  No matching staging information was found for the patient.    Oncology History Overview Note  # 2014- POLYCYTHEMIA VERA - JAK-2 Positive; on Hydrea 500mg /day; aspirin 81 mg a day; phlebotomy for Hct >43 [headaches];   # MAY 2019- BMBx- pan-hypercellular; no evidence of significant fibrosis; MAY [place]- 1389 cc/ splenomegaly; Oct 11th 2019- Increase in splenomegaly; Increased Hydrea 1500mg /day.   # nonarteritic ischemic optic neuropathy-Left eye [may 2018; Dr.Dingledein/Dr.Sitko at [facility]]; Previous Right eye ---------------------------------------    DIAGNOSIS: [ ]  POLYCYTHEMIA VERA  ;GOALS: control  CURRENT/MOST RECENT THERAPY [ ]  Hydrea        Polycythemia vera (HCC)      INTERVAL HISTORY: ambulating independently; alone.   Derek [name] 67 y.o.  male pleasant patient above history of polycythemia vera on hydrea is here for follow-up.  Patient denies any abdominal pain or early satiety.  Denies any unintentional  weight loss.  Denies any night sweats.  No fatigue.  No new symptoms.  No nausea no vomiting no weight loss no night sweats. Denies any fevers.  Patient admits compliance to Hydrea.  Review of Systems  Constitutional:  Negative for chills, diaphoresis and weight loss.  HENT:  Negative for nosebleeds and sore throat.   Eyes:  Negative for double vision.  Respiratory:  Negative for cough, hemoptysis, sputum production, shortness of breath and wheezing.   Cardiovascular:  Negative for chest pain, palpitations, orthopnea and leg swelling.  Gastrointestinal:  Negative for blood in stool, constipation, diarrhea, heartburn, melena, nausea and vomiting.  Genitourinary:  Negative for dysuria, frequency and urgency.  Musculoskeletal:  Negative for back pain and  joint pain.  Skin: Negative.  Negative for itching and rash.  Neurological:  Negative for dizziness, tingling, focal weakness, weakness and headaches.  Endo/Heme/Allergies:  Does not bruise/bleed easily.  Psychiatric/Behavioral:  Negative for depression. The patient is not nervous/anxious and does not have insomnia.       PAST MEDICAL HISTORY :  Past Medical History:  Diagnosis Date   Arthritis    GERD (gastroesophageal reflux disease)    Gout    Low serum testosterone level    Polycythemia vera (HCC)    Polycythemia, secondary 06/08/2015   Pulmonary nodules    Thoracic spine fracture (HCC)    compression fracture following a fall    PAST SURGICAL HISTORY :   Past Surgical History:  Procedure Laterality Date   COLONOSCOPY  2013    FAMILY HISTORY :   Family History  Problem Relation Age of Onset   Throat cancer Other        uncle   Migraines Neg Hx    Multiple sclerosis Neg Hx    Neurofibromatosis Neg Hx    Parkinsonism Neg Hx    Seizures Neg Hx    Neuropathy Neg Hx     SOCIAL HISTORY:   Social History   Tobacco Use   Smoking status: Never   Smokeless tobacco: Never  Vaping Use   Vaping status: Never Used  Substance Use Topics   Alcohol use: Yes    Alcohol/week: 0.0 standard drinks of alcohol    Comment: occasional alcohol use   Drug use: No    ALLERGIES:  is allergic to penicillin g.  MEDICATIONS:  Current Outpatient Medications  Medication Sig Dispense Refill   acetaminophen (  TYLENOL) 500 MG tablet Take 500 mg by mouth every 6 (six) hours as needed for fever.     amLODipine (NORVASC) 5 MG tablet Take 5 mg daily by mouth.  11   aspirin EC 81 MG tablet Take 81 mg daily by mouth.      Cholecalciferol 25 MCG (1000 UT) tablet Take 1,000 Units daily by mouth.      Esomeprazole Magnesium (NEXIUM PO) Take 2 capsules by mouth daily.      hydroxyurea (HYDREA) 500 MG capsule TAKE 1 CAPSULE BY MOUTH TWICE A DAY EXCEPT SATURDAY AND SUNDAY (TAKE AN EXTRA PILL ON  SAT/SUNDAY) MAY TAKE WITH FOOD TO MINIMIZE GI SIDE EFFECTS. 210 capsule 1   Multiple Vitamins-Minerals (MULTIVITAMIN ADULT PO) Take 1 tablet by mouth 1 day or 1 dose.     econazole nitrate 1 % cream Apply topically 2 (two) times daily. (Patient not taking: Reported on 01/30/2022)     meloxicam (MOBIC) 7.5 MG tablet Take 1 tablet (7.5 mg total) by mouth daily. (Patient not taking: Reported on 08/27/2023) 30 tablet 0   ondansetron (ZOFRAN ODT) 4 MG disintegrating tablet Take 1 tablet (4 mg total) by mouth every 8 (eight) hours as needed for nausea or vomiting. (Patient not taking: Reported on 07/31/2021) 20 tablet 0   No current facility-administered medications for this visit.    PHYSICAL EXAMINATION: ECOG PERFORMANCE STATUS: 0 - Asymptomatic  BP 138/79 (BP Location: Left Arm, Patient Position: Sitting)   Pulse 90   Temp 98.6 F (37 C) (Tympanic)   Resp 18   Wt 194 lb 6.4 oz (88.2 kg)   SpO2 99%   BMI 29.56 kg/m   Filed Weights   08/27/23 1513  Weight: 194 lb 6.4 oz (88.2 kg)    Positive for splenomegaly.   Physical Exam HENT:     Head: Normocephalic and atraumatic.     Mouth/Throat:     Pharynx: No oropharyngeal exudate.  Eyes:     Pupils: Pupils are equal, round, and reactive to light.  Cardiovascular:     Rate and Rhythm: Normal rate and regular rhythm.  Pulmonary:     Effort: No respiratory distress.     Breath sounds: No wheezing.  Abdominal:     General: Bowel sounds are normal. There is no distension.     Palpations: Abdomen is soft. There is no mass.     Tenderness: There is no abdominal tenderness. There is no guarding or rebound.  Musculoskeletal:        General: No tenderness. Normal range of motion.     Cervical back: Normal range of motion and neck supple.  Skin:    General: Skin is warm.  Neurological:     Mental Status: He is alert and oriented to person, place, and time.  Psychiatric:        Mood and Affect: Affect normal.     LABORATORY DATA:  I  have reviewed the data as listed    Component Value Date/Time   NA 139 08/27/2023 1501   NA 140 03/02/2014 0835   K 3.8 08/27/2023 1501   K 3.8 03/02/2014 0835   CL 105 08/27/2023 1501   CL 109 (H) 03/02/2014 0835   CO2 25 08/27/2023 1501   CO2 26 03/02/2014 0835   GLUCOSE 116 (H) 08/27/2023 1501   GLUCOSE 108 (H) 03/02/2014 0835   BUN 11 08/27/2023 1501   BUN 9 03/02/2014 0835   CREATININE 0.81 08/27/2023 1501   CREATININE 0.99 03/02/2014  1610   CALCIUM 8.8 (L) 08/27/2023 1501   CALCIUM 8.6 03/02/2014 0835   PROT 6.7 08/27/2023 1501   PROT 7.0 03/02/2014 0835   ALBUMIN 4.2 08/27/2023 1501   ALBUMIN 3.7 03/02/2014 0835   AST 21 08/27/2023 1501   ALT 16 08/27/2023 1501   ALT 24 03/02/2014 0835   ALKPHOS 82 08/27/2023 1501   ALKPHOS 96 03/02/2014 0835   BILITOT 0.9 08/27/2023 1501   GFRNONAA >60 08/27/2023 1501   GFRNONAA >60 03/02/2014 0835   GFRAA >60 08/15/2020 0953   GFRAA >60 03/02/2014 0835    No results found for: "SPEP", "UPEP"  Lab Results  Component Value Date   WBC 9.7 08/27/2023   NEUTROABS 8.1 (H) 08/27/2023   HGB 12.3 (L) 08/27/2023   HCT 41.9 08/27/2023   MCV 90.9 08/27/2023   PLT 189 08/27/2023      Chemistry      Component Value Date/Time   NA 139 08/27/2023 1501   NA 140 03/02/2014 0835   K 3.8 08/27/2023 1501   K 3.8 03/02/2014 0835   CL 105 08/27/2023 1501   CL 109 (H) 03/02/2014 0835   CO2 25 08/27/2023 1501   CO2 26 03/02/2014 0835   BUN 11 08/27/2023 1501   BUN 9 03/02/2014 0835   CREATININE 0.81 08/27/2023 1501   CREATININE 0.99 03/02/2014 0835      Component Value Date/Time   CALCIUM 8.8 (L) 08/27/2023 1501   CALCIUM 8.6 03/02/2014 0835   ALKPHOS 82 08/27/2023 1501   ALKPHOS 96 03/02/2014 0835   AST 21 08/27/2023 1501   ALT 16 08/27/2023 1501   ALT 24 03/02/2014 0835   BILITOT 0.9 08/27/2023 1501       RADIOGRAPHIC STUDIES: I have personally reviewed the radiological images as listed and agreed with the findings in  the report. No results found.   ASSESSMENT & PLAN:  Polycythemia vera (HCC) # Polycythemia vera Jak 2+; on Hydrea 500 mg twice a day; except Saturday Sunday-extra pill.. June 2021-bone marrow biopsy persistent myeloproliferative disease; no evidence of transformation or leukemia. MAY 2023-ultrasound: Splenomegaly with a volume 1097 mL, compared to 1319 prior study from 01/22/2022. Stable. Will repeat US in 3 months.  AUG, 28th 2024- Spleen measures 17.2 x 7.4 x 18.6 cm cm. Volume: 1240.6 cc- overal stable. Monitof ro now.   # Today-hematocrit is  41.9  proceed with phlebotomy today. Continue Hydrea.  # Left LE swelling- June 2024- ]No DVT identified.? Bakers cyst 1.8 x 0.5 x 4.5 cm- s/p steroid injection [[facility]]- improved. S/p meloxicam-   #Disposition:  # HOLD  Phlebotomy today # in 1 months-H&H; possible phlebotomy. # in 50months-H&H; possible phlebotomy. # in 3 months- MD; cbc/cmp/LDH-possible phlebotomy- Dr.B   No orders of the defined types were placed in this encounter.  All questions were answered. The patient knows to call the clinic with any problems, questions or concerns.      Earna Coder, MD 08/27/2023 3:57 PM

## 2023-08-27 NOTE — Assessment & Plan Note (Addendum)
#   Polycythemia vera Jak 2+; on Hydrea 500 mg twice a day; except Saturday Sunday-extra pill.. June 2021-bone marrow biopsy persistent myeloproliferative disease; no evidence of transformation or leukemia. MAY 2023-ultrasound: Splenomegaly with a volume 1097 mL, compared to 1319 prior study from 01/22/2022. Stable. Will repeat US in 3 months.  AUG, 28th 2024- Spleen measures 17.2 x 7.4 x 18.6 cm cm. Volume: 1240.6 cc- overal stable. Monitof ro now.   # Today-hematocrit is  41.9  proceed with phlebotomy today. Continue Hydrea.  # Left LE swelling- June 2024- ]No DVT identified.? Bakers cyst 1.8 x 0.5 x 4.5 cm- s/p steroid injection [[facility]]- improved. S/p meloxicam-   #Disposition:  # HOLD  Phlebotomy today # in 1 months-H&H; possible phlebotomy. # in 57months-H&H; possible phlebotomy. # in 3 months- MD; cbc/cmp/LDH-possible phlebotomy- Dr.B

## 2023-09-26 ENCOUNTER — Inpatient Hospital Stay: Payer: PPO | Attending: Internal Medicine

## 2023-09-26 ENCOUNTER — Inpatient Hospital Stay: Payer: PPO

## 2023-09-26 DIAGNOSIS — D45 Polycythemia vera: Secondary | ICD-10-CM | POA: Diagnosis not present

## 2023-09-26 DIAGNOSIS — D751 Secondary polycythemia: Secondary | ICD-10-CM

## 2023-09-26 DIAGNOSIS — R161 Splenomegaly, not elsewhere classified: Secondary | ICD-10-CM

## 2023-09-26 LAB — HEMOGLOBIN AND HEMATOCRIT (CANCER CENTER ONLY)
HCT: 42.4 % (ref 39.0–52.0)
Hemoglobin: 12.7 g/dL — ABNORMAL LOW (ref 13.0–17.0)

## 2023-09-26 NOTE — Progress Notes (Unsigned)
Hct 42.4 today. NO phlebotomy per Dr Sharlette Dense notes

## 2023-10-06 DIAGNOSIS — Z03818 Encounter for observation for suspected exposure to other biological agents ruled out: Secondary | ICD-10-CM | POA: Diagnosis not present

## 2023-10-06 DIAGNOSIS — B9689 Other specified bacterial agents as the cause of diseases classified elsewhere: Secondary | ICD-10-CM | POA: Diagnosis not present

## 2023-10-06 DIAGNOSIS — J019 Acute sinusitis, unspecified: Secondary | ICD-10-CM | POA: Diagnosis not present

## 2023-10-28 ENCOUNTER — Inpatient Hospital Stay: Payer: PPO | Attending: Internal Medicine

## 2023-10-28 ENCOUNTER — Inpatient Hospital Stay: Payer: PPO

## 2023-10-28 DIAGNOSIS — D45 Polycythemia vera: Secondary | ICD-10-CM | POA: Diagnosis not present

## 2023-10-28 LAB — HEMOGLOBIN AND HEMATOCRIT (CANCER CENTER ONLY)
HCT: 42.6 % (ref 39.0–52.0)
Hemoglobin: 13 g/dL (ref 13.0–17.0)

## 2023-10-28 NOTE — Progress Notes (Signed)
Hbg/Hct 13/42.6. NO phlebotomy today

## 2023-11-11 ENCOUNTER — Telehealth: Payer: Self-pay | Admitting: *Deleted

## 2023-11-11 ENCOUNTER — Inpatient Hospital Stay: Payer: PPO

## 2023-11-11 VITALS — BP 117/80 | HR 83 | Resp 20

## 2023-11-11 DIAGNOSIS — D45 Polycythemia vera: Secondary | ICD-10-CM | POA: Diagnosis not present

## 2023-11-11 DIAGNOSIS — D751 Secondary polycythemia: Secondary | ICD-10-CM

## 2023-11-11 LAB — HEMOGLOBIN AND HEMATOCRIT (CANCER CENTER ONLY)
HCT: 46.3 % (ref 39.0–52.0)
Hemoglobin: 14 g/dL (ref 13.0–17.0)

## 2023-11-11 LAB — LACTATE DEHYDROGENASE: LDH: 202 U/L — ABNORMAL HIGH (ref 98–192)

## 2023-11-11 NOTE — Telephone Encounter (Signed)
Sch'd for today

## 2023-11-11 NOTE — Telephone Encounter (Signed)
Patient called reporting that he is feeling "swimmy headed" and he admits to a headache after having bent over and getting back up He is asking if he can come get his labs checked today and get a phlebotomy is indicated; his next appointment is not until 12/10. Please advise

## 2023-11-20 DIAGNOSIS — M7989 Other specified soft tissue disorders: Secondary | ICD-10-CM | POA: Diagnosis not present

## 2023-11-20 DIAGNOSIS — M79672 Pain in left foot: Secondary | ICD-10-CM | POA: Diagnosis not present

## 2023-11-20 DIAGNOSIS — M109 Gout, unspecified: Secondary | ICD-10-CM | POA: Diagnosis not present

## 2023-11-26 ENCOUNTER — Inpatient Hospital Stay: Payer: PPO | Admitting: Internal Medicine

## 2023-11-26 ENCOUNTER — Encounter: Payer: Self-pay | Admitting: Internal Medicine

## 2023-11-26 ENCOUNTER — Inpatient Hospital Stay: Payer: PPO | Attending: Internal Medicine

## 2023-11-26 ENCOUNTER — Inpatient Hospital Stay: Payer: PPO

## 2023-11-26 VITALS — BP 131/83 | HR 86 | Temp 96.5°F | Ht 68.0 in | Wt 195.2 lb

## 2023-11-26 DIAGNOSIS — M7989 Other specified soft tissue disorders: Secondary | ICD-10-CM | POA: Insufficient documentation

## 2023-11-26 DIAGNOSIS — D45 Polycythemia vera: Secondary | ICD-10-CM | POA: Diagnosis not present

## 2023-11-26 LAB — CMP (CANCER CENTER ONLY)
ALT: 18 U/L (ref 0–44)
AST: 19 U/L (ref 15–41)
Albumin: 3.9 g/dL (ref 3.5–5.0)
Alkaline Phosphatase: 89 U/L (ref 38–126)
Anion gap: 9 (ref 5–15)
BUN: 15 mg/dL (ref 8–23)
CO2: 25 mmol/L (ref 22–32)
Calcium: 8.8 mg/dL — ABNORMAL LOW (ref 8.9–10.3)
Chloride: 105 mmol/L (ref 98–111)
Creatinine: 0.67 mg/dL (ref 0.61–1.24)
GFR, Estimated: >60 mL/min (ref >60)
Glucose, Bld: 106 mg/dL — ABNORMAL HIGH (ref 70–99)
Potassium: 3.5 mmol/L (ref 3.5–5.1)
Sodium: 139 mmol/L (ref 135–145)
Total Bilirubin: 0.9 mg/dL (ref <1.2)
Total Protein: 6.2 g/dL — ABNORMAL LOW (ref 6.5–8.1)

## 2023-11-26 LAB — CBC WITH DIFFERENTIAL (CANCER CENTER ONLY)
Abs Immature Granulocytes: 0.19 10*3/uL — ABNORMAL HIGH (ref 0.00–0.07)
Basophils Absolute: 0.1 10*3/uL (ref 0.0–0.1)
Basophils Relative: 1 %
Eosinophils Absolute: 0.3 10*3/uL (ref 0.0–0.5)
Eosinophils Relative: 2 %
HCT: 42.4 % (ref 39.0–52.0)
Hemoglobin: 12.8 g/dL — ABNORMAL LOW (ref 13.0–17.0)
Immature Granulocytes: 1 %
Lymphocytes Relative: 7 %
Lymphs Abs: 1 10*3/uL (ref 0.7–4.0)
MCH: 27.3 pg (ref 26.0–34.0)
MCHC: 30.2 g/dL (ref 30.0–36.0)
MCV: 90.4 fL (ref 80.0–100.0)
Monocytes Absolute: 0.3 10*3/uL (ref 0.1–1.0)
Monocytes Relative: 2 %
Neutro Abs: 13.2 10*3/uL — ABNORMAL HIGH (ref 1.7–7.7)
Neutrophils Relative %: 87 %
Platelet Count: 194 10*3/uL (ref 150–400)
RBC: 4.69 MIL/uL (ref 4.22–5.81)
RDW: 16 % — ABNORMAL HIGH (ref 11.5–15.5)
Smear Review: NORMAL
WBC Count: 15.1 10*3/uL — ABNORMAL HIGH (ref 4.0–10.5)
nRBC: 0 % (ref 0.0–0.2)

## 2023-11-26 LAB — LACTATE DEHYDROGENASE: LDH: 200 U/L — ABNORMAL HIGH (ref 98–192)

## 2023-11-26 NOTE — Progress Notes (Signed)
[facility] OFFICE PROGRESS NOTE  Patient Care Team: Marisue Ivan, MD as PCP - General (Family Medicine) Earna Coder, MD as Consulting Physician (Internal Medicine)   Cancer Staging  No matching staging information was found for the patient.    Oncology History Overview Note  # 2014- POLYCYTHEMIA VERA - JAK-2 Positive; on Hydrea 500mg /day; aspirin 81 mg a day; phlebotomy for Hct >43 [headaches];   # MAY 2019- BMBx- pan-hypercellular; no evidence of significant fibrosis; MAY [place]- 1389 cc/ splenomegaly; Oct 11th 2019- Increase in splenomegaly; Increased Hydrea 1500mg /day.   # nonarteritic ischemic optic neuropathy-Left eye [may 2018; Dr.Dingledein/Dr.Sitko at [facility]]; Previous Right eye ---------------------------------------    DIAGNOSIS: [ ]  POLYCYTHEMIA VERA  ;GOALS: control  CURRENT/MOST RECENT THERAPY [ ]  Hydrea        Polycythemia vera (HCC)     INTERVAL HISTORY: ambulating independently; alone.   Derek [name] 67 y.o.  male pleasant patient above history of polycythemia vera on hydrea is here for follow-up.  In the interim patient diagnosed with gout.  S/p recent acute attack of left foot.   Patient denies any abdominal pain or early satiety.  Denies any unintentional  weight loss.  Denies any night sweats.  No fatigue.  No new symptoms.  No nausea no vomiting no weight loss no night sweats. Denies any fevers.  Patient admits compliance to Hydrea.  Review of Systems  Constitutional:  Negative for chills, diaphoresis and weight loss.  HENT:  Negative for nosebleeds and sore throat.   Eyes:  Negative for double vision.  Respiratory:  Negative for cough, hemoptysis, sputum production, shortness of breath and wheezing.   Cardiovascular:  Negative for chest pain, palpitations, orthopnea and leg swelling.  Gastrointestinal:  Negative for blood in stool, constipation, diarrhea, heartburn, melena, nausea and vomiting.  Genitourinary:  Negative  for dysuria, frequency and urgency.  Musculoskeletal:  Negative for back pain and joint pain.  Skin: Negative.  Negative for itching and rash.  Neurological:  Negative for dizziness, tingling, focal weakness, weakness and headaches.  Endo/Heme/Allergies:  Does not bruise/bleed easily.  Psychiatric/Behavioral:  Negative for depression. The patient is not nervous/anxious and does not have insomnia.       PAST MEDICAL HISTORY :  Past Medical History:  Diagnosis Date   Arthritis    GERD (gastroesophageal reflux disease)    Gout    Low serum testosterone level    Polycythemia vera (HCC)    Polycythemia, secondary 06/08/2015   Pulmonary nodules    Thoracic spine fracture (HCC)    compression fracture following a fall    PAST SURGICAL HISTORY :   Past Surgical History:  Procedure Laterality Date   COLONOSCOPY  2013    FAMILY HISTORY :   Family History  Problem Relation Age of Onset   Throat cancer Other        uncle   Migraines Neg Hx    Multiple sclerosis Neg Hx    Neurofibromatosis Neg Hx    Parkinsonism Neg Hx    Seizures Neg Hx    Neuropathy Neg Hx     SOCIAL HISTORY:   Social History   Tobacco Use   Smoking status: Never   Smokeless tobacco: Never  Vaping Use   Vaping status: Never Used  Substance Use Topics   Alcohol use: Yes    Alcohol/week: 0.0 standard drinks of alcohol    Comment: occasional alcohol use   Drug use: No    ALLERGIES:  is allergic to  penicillin g.  MEDICATIONS:  Current Outpatient Medications  Medication Sig Dispense Refill   acetaminophen (TYLENOL) 500 MG tablet Take 500 mg by mouth every 6 (six) hours as needed for fever.     amLODipine (NORVASC) 5 MG tablet Take 5 mg daily by mouth.  11   aspirin EC 81 MG tablet Take 81 mg daily by mouth.      Cholecalciferol 25 MCG (1000 UT) tablet Take 1,000 Units daily by mouth.      Esomeprazole Magnesium (NEXIUM PO) Take 2 capsules by mouth daily.      hydroxyurea (HYDREA) 500 MG capsule TAKE  1 CAPSULE BY MOUTH TWICE A DAY EXCEPT SATURDAY AND SUNDAY (TAKE AN EXTRA PILL ON SAT/SUNDAY) MAY TAKE WITH FOOD TO MINIMIZE GI SIDE EFFECTS. 210 capsule 1   Multiple Vitamins-Minerals (MULTIVITAMIN ADULT PO) Take 1 tablet by mouth 1 day or 1 dose.     econazole nitrate 1 % cream Apply topically 2 (two) times daily. (Patient not taking: Reported on 01/30/2022)     meloxicam (MOBIC) 7.5 MG tablet Take 1 tablet (7.5 mg total) by mouth daily. (Patient not taking: Reported on 08/27/2023) 30 tablet 0   ondansetron (ZOFRAN ODT) 4 MG disintegrating tablet Take 1 tablet (4 mg total) by mouth every 8 (eight) hours as needed for nausea or vomiting. (Patient not taking: Reported on 07/31/2021) 20 tablet 0   No current facility-administered medications for this visit.    PHYSICAL EXAMINATION: ECOG PERFORMANCE STATUS: 0 - Asymptomatic  BP 131/83 (BP Location: Right Arm, Patient Position: Sitting, Cuff Size: Normal)   Pulse 86   Temp (!) 96.5 F (35.8 C) (Tympanic)   Ht 5\' 8"  (1.727 m)   Wt 195 lb 3.2 oz (88.5 kg)   SpO2 100%   BMI 29.68 kg/m   Filed Weights   11/26/23 1423  Weight: 195 lb 3.2 oz (88.5 kg)     Positive for splenomegaly.   Physical Exam HENT:     Head: Normocephalic and atraumatic.     Mouth/Throat:     Pharynx: No oropharyngeal exudate.  Eyes:     Pupils: Pupils are equal, round, and reactive to light.  Cardiovascular:     Rate and Rhythm: Normal rate and regular rhythm.  Pulmonary:     Effort: No respiratory distress.     Breath sounds: No wheezing.  Abdominal:     General: Bowel sounds are normal. There is no distension.     Palpations: Abdomen is soft. There is no mass.     Tenderness: There is no abdominal tenderness. There is no guarding or rebound.  Musculoskeletal:        General: No tenderness. Normal range of motion.     Cervical back: Normal range of motion and neck supple.  Skin:    General: Skin is warm.  Neurological:     Mental Status: He is alert and  oriented to person, place, and time.  Psychiatric:        Mood and Affect: Affect normal.     LABORATORY DATA:  I have reviewed the data as listed    Component Value Date/Time   NA 139 11/26/2023 1416   NA 140 03/02/2014 0835   K 3.5 11/26/2023 1416   K 3.8 03/02/2014 0835   CL 105 11/26/2023 1416   CL 109 (H) 03/02/2014 0835   CO2 25 11/26/2023 1416   CO2 26 03/02/2014 0835   GLUCOSE 106 (H) 11/26/2023 1416   GLUCOSE 108 (H) 03/02/2014  0835   BUN 15 11/26/2023 1416   BUN 9 03/02/2014 0835   CREATININE 0.67 11/26/2023 1416   CREATININE 0.99 03/02/2014 0835   CALCIUM 8.8 (L) 11/26/2023 1416   CALCIUM 8.6 03/02/2014 0835   PROT 6.2 (L) 11/26/2023 1416   PROT 7.0 03/02/2014 0835   ALBUMIN 3.9 11/26/2023 1416   ALBUMIN 3.7 03/02/2014 0835   AST 19 11/26/2023 1416   ALT 18 11/26/2023 1416   ALT 24 03/02/2014 0835   ALKPHOS 89 11/26/2023 1416   ALKPHOS 96 03/02/2014 0835   BILITOT 0.9 11/26/2023 1416   GFRNONAA >60 11/26/2023 1416   GFRNONAA >60 03/02/2014 0835   GFRAA >60 08/15/2020 0953   GFRAA >60 03/02/2014 0835    No results found for: "SPEP", "UPEP"  Lab Results  Component Value Date   WBC 15.1 (H) 11/26/2023   NEUTROABS PENDING 11/26/2023   HGB 12.8 (L) 11/26/2023   HCT 42.4 11/26/2023   MCV 90.4 11/26/2023   PLT 194 11/26/2023      Chemistry      Component Value Date/Time   NA 139 11/26/2023 1416   NA 140 03/02/2014 0835   K 3.5 11/26/2023 1416   K 3.8 03/02/2014 0835   CL 105 11/26/2023 1416   CL 109 (H) 03/02/2014 0835   CO2 25 11/26/2023 1416   CO2 26 03/02/2014 0835   BUN 15 11/26/2023 1416   BUN 9 03/02/2014 0835   CREATININE 0.67 11/26/2023 1416   CREATININE 0.99 03/02/2014 0835      Component Value Date/Time   CALCIUM 8.8 (L) 11/26/2023 1416   CALCIUM 8.6 03/02/2014 0835   ALKPHOS 89 11/26/2023 1416   ALKPHOS 96 03/02/2014 0835   AST 19 11/26/2023 1416   ALT 18 11/26/2023 1416   ALT 24 03/02/2014 0835   BILITOT 0.9 11/26/2023 1416        RADIOGRAPHIC STUDIES: I have personally reviewed the radiological images as listed and agreed with the findings in the report. No results found.   ASSESSMENT & PLAN:  Polycythemia vera (HCC) # Polycythemia vera Jak 2+; on Hydrea 500 mg twice a day; except Saturday Sunday-extra pill.. June 2021-bone marrow biopsy persistent myeloproliferative disease; no evidence of transformation or leukemia. MAY 2023-ultrasound: Splenomegaly with a volume 1097 mL, compared to 1319 prior study from 01/22/2022. Stable. Will repeat US in 3 months.  AUG, 28th 2024- Spleen measures 17.2 x 7.4 x 18.6 cm cm. Volume: 1240.6 cc- overal stable. Monitof ro now.   # Today-hematocrit is  41.9  proceed with phlebotomy today. Continue Hydrea.  # Left LE swelling- June 2024- ]No DVT identified.? Bakers cyst 1.8 x 0.5 x 4.5 cm- s/p steroid injection [[facility]]- improved. S/p meloxicam-   #Disposition:  # HOLD  Phlebotomy today # in 1 months-H&H; possible phlebotomy. # in 11months-H&H; possible phlebotomy. # in 3 months- MD; cbc/cmp/LDH-possible phlebotomy- Dr.B   No orders of the defined types were placed in this encounter.  All questions were answered. The patient knows to call the clinic with any problems, questions or concerns.      Earna Coder, MD 11/26/2023 2:46 PM

## 2023-11-26 NOTE — Progress Notes (Signed)
No questions/concerns today.  Was treated for gout since last visit.

## 2023-11-26 NOTE — Assessment & Plan Note (Signed)
#   Polycythemia vera Jak 2+; on Hydrea 500 mg twice a day; except Saturday Sunday-extra pill.. June 2021-bone marrow biopsy persistent myeloproliferative disease; no evidence of transformation or leukemia. MAY 2023-ultrasound: Splenomegaly with a volume 1097 mL, compared to 1319 prior study from 01/22/2022. Stable. Will repeat US in 3 months.  AUG, 28th 2024- Spleen measures 17.2 x 7.4 x 18.6 cm cm. Volume: 1240.6 cc- overal stable. Monitof ro now.   # Today-hematocrit is  41.9  proceed with phlebotomy today. Continue Hydrea.  # Left LE swelling- June 2024- ]No DVT identified.? Bakers cyst 1.8 x 0.5 x 4.5 cm- s/p steroid injection [[facility]]- improved. S/p meloxicam-   #Disposition:  # HOLD  Phlebotomy today # in 1 months-H&H; possible phlebotomy. # in 57months-H&H; possible phlebotomy. # in 3 months- MD; cbc/cmp/LDH-possible phlebotomy- Dr.B

## 2023-11-26 NOTE — Progress Notes (Signed)
HCT 42.4. Per MD no phleb today

## 2023-12-08 DIAGNOSIS — M25521 Pain in right elbow: Secondary | ICD-10-CM | POA: Diagnosis not present

## 2023-12-08 DIAGNOSIS — M109 Gout, unspecified: Secondary | ICD-10-CM | POA: Diagnosis not present

## 2023-12-17 ENCOUNTER — Other Ambulatory Visit: Payer: Self-pay | Admitting: *Deleted

## 2023-12-17 DIAGNOSIS — D45 Polycythemia vera: Secondary | ICD-10-CM

## 2023-12-19 ENCOUNTER — Inpatient Hospital Stay: Payer: PPO | Attending: Internal Medicine

## 2023-12-19 ENCOUNTER — Inpatient Hospital Stay: Payer: PPO

## 2023-12-19 VITALS — BP 131/81 | HR 102 | Temp 97.5°F

## 2023-12-19 DIAGNOSIS — D45 Polycythemia vera: Secondary | ICD-10-CM

## 2023-12-19 LAB — HEMOGLOBIN AND HEMATOCRIT (CANCER CENTER ONLY)
HCT: 46.2 % (ref 39.0–52.0)
Hemoglobin: 13.7 g/dL (ref 13.0–17.0)

## 2023-12-27 ENCOUNTER — Other Ambulatory Visit: Payer: PPO

## 2024-01-14 DIAGNOSIS — I10 Essential (primary) hypertension: Secondary | ICD-10-CM | POA: Diagnosis not present

## 2024-01-14 DIAGNOSIS — E781 Pure hyperglyceridemia: Secondary | ICD-10-CM | POA: Diagnosis not present

## 2024-01-16 DIAGNOSIS — F418 Other specified anxiety disorders: Secondary | ICD-10-CM | POA: Diagnosis not present

## 2024-01-16 DIAGNOSIS — I10 Essential (primary) hypertension: Secondary | ICD-10-CM | POA: Diagnosis not present

## 2024-01-16 DIAGNOSIS — Z8739 Personal history of other diseases of the musculoskeletal system and connective tissue: Secondary | ICD-10-CM | POA: Diagnosis not present

## 2024-01-16 DIAGNOSIS — D45 Polycythemia vera: Secondary | ICD-10-CM | POA: Diagnosis not present

## 2024-01-16 DIAGNOSIS — K219 Gastro-esophageal reflux disease without esophagitis: Secondary | ICD-10-CM | POA: Diagnosis not present

## 2024-01-16 DIAGNOSIS — R0981 Nasal congestion: Secondary | ICD-10-CM | POA: Diagnosis not present

## 2024-01-16 DIAGNOSIS — Z136 Encounter for screening for cardiovascular disorders: Secondary | ICD-10-CM | POA: Diagnosis not present

## 2024-01-27 ENCOUNTER — Inpatient Hospital Stay: Payer: PPO | Attending: Internal Medicine

## 2024-01-27 ENCOUNTER — Inpatient Hospital Stay: Payer: PPO

## 2024-01-27 DIAGNOSIS — D45 Polycythemia vera: Secondary | ICD-10-CM | POA: Diagnosis not present

## 2024-01-27 LAB — HEMOGLOBIN AND HEMATOCRIT (CANCER CENTER ONLY)
HCT: 40.6 % (ref 39.0–52.0)
Hemoglobin: 12 g/dL — ABNORMAL LOW (ref 13.0–17.0)

## 2024-01-27 NOTE — Progress Notes (Signed)
 HCT 40.6 today, phleb ordered if HCT > 43. Patient refuses phlebotomy today, asymptomatic. Discharged, stable

## 2024-01-29 ENCOUNTER — Other Ambulatory Visit: Payer: Self-pay | Admitting: Internal Medicine

## 2024-01-29 DIAGNOSIS — D45 Polycythemia vera: Secondary | ICD-10-CM

## 2024-02-05 ENCOUNTER — Encounter: Payer: Self-pay | Admitting: Internal Medicine

## 2024-02-20 DIAGNOSIS — L02622 Furuncle of left foot: Secondary | ICD-10-CM | POA: Diagnosis not present

## 2024-02-20 DIAGNOSIS — B353 Tinea pedis: Secondary | ICD-10-CM | POA: Diagnosis not present

## 2024-02-24 ENCOUNTER — Inpatient Hospital Stay: Payer: PPO | Attending: Internal Medicine

## 2024-02-24 ENCOUNTER — Inpatient Hospital Stay: Payer: PPO

## 2024-02-24 ENCOUNTER — Encounter: Payer: Self-pay | Admitting: Internal Medicine

## 2024-02-24 ENCOUNTER — Inpatient Hospital Stay: Payer: PPO | Admitting: Internal Medicine

## 2024-02-24 VITALS — BP 132/79 | HR 93 | Temp 97.0°F | Resp 19 | Wt 197.4 lb

## 2024-02-24 DIAGNOSIS — R161 Splenomegaly, not elsewhere classified: Secondary | ICD-10-CM | POA: Diagnosis not present

## 2024-02-24 DIAGNOSIS — Z79899 Other long term (current) drug therapy: Secondary | ICD-10-CM | POA: Diagnosis not present

## 2024-02-24 DIAGNOSIS — D45 Polycythemia vera: Secondary | ICD-10-CM

## 2024-02-24 LAB — CBC WITH DIFFERENTIAL (CANCER CENTER ONLY)
Abs Immature Granulocytes: 0.14 10*3/uL — ABNORMAL HIGH (ref 0.00–0.07)
Basophils Absolute: 0.2 10*3/uL — ABNORMAL HIGH (ref 0.0–0.1)
Basophils Relative: 1 %
Eosinophils Absolute: 0.2 10*3/uL (ref 0.0–0.5)
Eosinophils Relative: 2 %
HCT: 43.3 % (ref 39.0–52.0)
Hemoglobin: 12.9 g/dL — ABNORMAL LOW (ref 13.0–17.0)
Immature Granulocytes: 1 %
Lymphocytes Relative: 6 %
Lymphs Abs: 0.8 10*3/uL (ref 0.7–4.0)
MCH: 26.8 pg (ref 26.0–34.0)
MCHC: 29.8 g/dL — ABNORMAL LOW (ref 30.0–36.0)
MCV: 90 fL (ref 80.0–100.0)
Monocytes Absolute: 0.3 10*3/uL (ref 0.1–1.0)
Monocytes Relative: 2 %
Neutro Abs: 12 10*3/uL — ABNORMAL HIGH (ref 1.7–7.7)
Neutrophils Relative %: 88 %
Platelet Count: 338 10*3/uL (ref 150–400)
RBC: 4.81 MIL/uL (ref 4.22–5.81)
RDW: 15.7 % — ABNORMAL HIGH (ref 11.5–15.5)
Smear Review: NORMAL
WBC Count: 13.6 10*3/uL — ABNORMAL HIGH (ref 4.0–10.5)
nRBC: 0 % (ref 0.0–0.2)

## 2024-02-24 LAB — CMP (CANCER CENTER ONLY)
ALT: 19 U/L (ref 0–44)
AST: 25 U/L (ref 15–41)
Albumin: 4 g/dL (ref 3.5–5.0)
Alkaline Phosphatase: 94 U/L (ref 38–126)
Anion gap: 7 (ref 5–15)
BUN: 14 mg/dL (ref 8–23)
CO2: 26 mmol/L (ref 22–32)
Calcium: 9 mg/dL (ref 8.9–10.3)
Chloride: 105 mmol/L (ref 98–111)
Creatinine: 0.89 mg/dL (ref 0.61–1.24)
GFR, Estimated: >60 mL/min (ref >60)
Glucose, Bld: 136 mg/dL — ABNORMAL HIGH (ref 70–99)
Potassium: 3.8 mmol/L (ref 3.5–5.1)
Sodium: 138 mmol/L (ref 135–145)
Total Bilirubin: 1 mg/dL (ref 0.0–1.2)
Total Protein: 6.6 g/dL (ref 6.5–8.1)

## 2024-02-24 LAB — LACTATE DEHYDROGENASE: LDH: 197 U/L — ABNORMAL HIGH (ref 98–192)

## 2024-02-24 NOTE — Progress Notes (Signed)
 Feeling fine. Occasional sinus headaches. On antibiotics for a spot on his foot. Appetite is good. Energy is good.

## 2024-02-24 NOTE — Progress Notes (Signed)
 [name] OFFICE PROGRESS NOTE  Patient Care Team: Marisue Ivan, MD as PCP - General (Family Medicine) Earna Coder, MD as Consulting Physician (Internal Medicine)   Cancer Staging  No matching staging information was found for the patient.    Oncology History Overview Note  # 2014- POLYCYTHEMIA VERA - JAK-2 Positive; on Hydrea 500mg /day; aspirin 81 mg a day; phlebotomy for Hct >43 [headaches];   # MAY 2019- BMBx- pan-hypercellular; no evidence of significant fibrosis; MAY [place]- 1389 cc/ splenomegaly; Oct 11th 2019- Increase in splenomegaly; Increased Hydrea 1500mg /day.   # nonarteritic ischemic optic neuropathy-Left eye [may 2018; Dr.Dingledein/Dr.Sitko at [facility]]; Previous Right eye ---------------------------------------    DIAGNOSIS: [ ]  POLYCYTHEMIA VERA  ;GOALS: control  CURRENT/MOST RECENT THERAPY [ ]  Hydrea        Polycythemia vera (HCC)     INTERVAL HISTORY: ambulating independently; alone.   Derek [name] 68 y.o.  male pleasant patient above history of polycythemia vera on hydrea is here for follow-up.  Feeling fine. Occasional sinus headaches. On antibiotics for a  infection on his foot. Appetite is good. Energy is good.   Patient denies any abdominal pain or early satiety.  Denies any unintentional  weight loss.  Denies any night sweats.  No fatigue. Patient admits compliance to Hydrea.  Review of Systems  Constitutional:  Negative for chills, diaphoresis and weight loss.  HENT:  Negative for nosebleeds and sore throat.   Eyes:  Negative for double vision.  Respiratory:  Negative for cough, hemoptysis, sputum production, shortness of breath and wheezing.   Cardiovascular:  Negative for chest pain, palpitations, orthopnea and leg swelling.  Gastrointestinal:  Negative for blood in stool, constipation, diarrhea, heartburn, melena, nausea and vomiting.  Genitourinary:  Negative for dysuria, frequency and urgency.  Musculoskeletal:   Negative for back pain and joint pain.  Skin: Negative.  Negative for itching and rash.  Neurological:  Negative for dizziness, tingling, focal weakness, weakness and headaches.  Endo/Heme/Allergies:  Does not bruise/bleed easily.  Psychiatric/Behavioral:  Negative for depression. The patient is not nervous/anxious and does not have insomnia.       PAST MEDICAL HISTORY :  Past Medical History:  Diagnosis Date   Arthritis    GERD (gastroesophageal reflux disease)    Gout    Low serum testosterone level    Polycythemia vera (HCC)    Polycythemia, secondary 06/08/2015   Pulmonary nodules    Thoracic spine fracture (HCC)    compression fracture following a fall    PAST SURGICAL HISTORY :   Past Surgical History:  Procedure Laterality Date   COLONOSCOPY  2013    FAMILY HISTORY :   Family History  Problem Relation Age of Onset   Throat cancer Other        uncle   Migraines Neg Hx    Multiple sclerosis Neg Hx    Neurofibromatosis Neg Hx    Parkinsonism Neg Hx    Seizures Neg Hx    Neuropathy Neg Hx     SOCIAL HISTORY:   Social History   Tobacco Use   Smoking status: Never   Smokeless tobacco: Never  Vaping Use   Vaping status: Never Used  Substance Use Topics   Alcohol use: Yes    Alcohol/week: 0.0 standard drinks of alcohol    Comment: occasional alcohol use   Drug use: No    ALLERGIES:  is allergic to penicillin g.  MEDICATIONS:  Current Outpatient Medications  Medication Sig Dispense Refill  amLODipine (NORVASC) 5 MG tablet Take 5 mg daily by mouth.  11   aspirin EC 81 MG tablet Take 81 mg daily by mouth.      Cholecalciferol 25 MCG (1000 UT) tablet Take 1,000 Units daily by mouth.      doxycycline (VIBRA-TABS) 100 MG tablet Take 1 tablet by mouth 2 (two) times daily.     Esomeprazole Magnesium (NEXIUM PO) Take 2 capsules by mouth daily.      hydroxyurea (HYDREA) 500 MG capsule TAKE 1 CAPSULE BY MOUTH TWICE A DAY EXCEPT SATURDAY AND SUNDAY (TAKE AN EXTRA  PILL ON SAT/SUNDAY) MAY TAKE WITH FOOD TO MINIMIZE GI SIDE EFFECTS. 210 capsule 1   meloxicam (MOBIC) 7.5 MG tablet Take 1 tablet (7.5 mg total) by mouth daily. 30 tablet 0   Multiple Vitamins-Minerals (MULTIVITAMIN ADULT PO) Take 1 tablet by mouth 1 day or 1 dose.     acetaminophen (TYLENOL) 500 MG tablet Take 500 mg by mouth every 6 (six) hours as needed for fever.     econazole nitrate 1 % cream Apply topically 2 (two) times daily. (Patient not taking: Reported on 02/24/2024)     ondansetron (ZOFRAN ODT) 4 MG disintegrating tablet Take 1 tablet (4 mg total) by mouth every 8 (eight) hours as needed for nausea or vomiting. (Patient not taking: Reported on 02/24/2024) 20 tablet 0   No current facility-administered medications for this visit.    PHYSICAL EXAMINATION: ECOG PERFORMANCE STATUS: 0 - Asymptomatic  BP 132/79   Pulse 93   Temp (!) 97 F (36.1 C) (Tympanic)   Resp 19   Wt 197 lb 6.4 oz (89.5 kg)   SpO2 98%   BMI 30.01 kg/m   Filed Weights   02/24/24 1344  Weight: 197 lb 6.4 oz (89.5 kg)     Positive for splenomegaly.   Physical Exam HENT:     Head: Normocephalic and atraumatic.     Mouth/Throat:     Pharynx: No oropharyngeal exudate.  Eyes:     Pupils: Pupils are equal, round, and reactive to light.  Cardiovascular:     Rate and Rhythm: Normal rate and regular rhythm.  Pulmonary:     Effort: No respiratory distress.     Breath sounds: No wheezing.  Abdominal:     General: Bowel sounds are normal. There is no distension.     Palpations: Abdomen is soft. There is no mass.     Tenderness: There is no abdominal tenderness. There is no guarding or rebound.  Musculoskeletal:        General: No tenderness. Normal range of motion.     Cervical back: Normal range of motion and neck supple.  Skin:    General: Skin is warm.  Neurological:     Mental Status: He is alert and oriented to person, place, and time.  Psychiatric:        Mood and Affect: Affect normal.      LABORATORY DATA:  I have reviewed the data as listed    Component Value Date/Time   NA 138 02/24/2024 1332   NA 140 03/02/2014 0835   K 3.8 02/24/2024 1332   K 3.8 03/02/2014 0835   CL 105 02/24/2024 1332   CL 109 (H) 03/02/2014 0835   CO2 26 02/24/2024 1332   CO2 26 03/02/2014 0835   GLUCOSE 136 (H) 02/24/2024 1332   GLUCOSE 108 (H) 03/02/2014 0835   BUN 14 02/24/2024 1332   BUN 9 03/02/2014 0835   CREATININE  0.89 02/24/2024 1332   CREATININE 0.99 03/02/2014 0835   CALCIUM 9.0 02/24/2024 1332   CALCIUM 8.6 03/02/2014 0835   PROT 6.6 02/24/2024 1332   PROT 7.0 03/02/2014 0835   ALBUMIN 4.0 02/24/2024 1332   ALBUMIN 3.7 03/02/2014 0835   AST 25 02/24/2024 1332   ALT 19 02/24/2024 1332   ALT 24 03/02/2014 0835   ALKPHOS 94 02/24/2024 1332   ALKPHOS 96 03/02/2014 0835   BILITOT 1.0 02/24/2024 1332   GFRNONAA >60 02/24/2024 1332   GFRNONAA >60 03/02/2014 0835   GFRAA >60 08/15/2020 0953   GFRAA >60 03/02/2014 0835    No results found for: "SPEP", "UPEP"  Lab Results  Component Value Date   WBC 13.6 (H) 02/24/2024   NEUTROABS PENDING 02/24/2024   HGB 12.9 (L) 02/24/2024   HCT 43.3 02/24/2024   MCV 90.0 02/24/2024   PLT 338 02/24/2024      Chemistry      Component Value Date/Time   NA 138 02/24/2024 1332   NA 140 03/02/2014 0835   K 3.8 02/24/2024 1332   K 3.8 03/02/2014 0835   CL 105 02/24/2024 1332   CL 109 (H) 03/02/2014 0835   CO2 26 02/24/2024 1332   CO2 26 03/02/2014 0835   BUN 14 02/24/2024 1332   BUN 9 03/02/2014 0835   CREATININE 0.89 02/24/2024 1332   CREATININE 0.99 03/02/2014 0835      Component Value Date/Time   CALCIUM 9.0 02/24/2024 1332   CALCIUM 8.6 03/02/2014 0835   ALKPHOS 94 02/24/2024 1332   ALKPHOS 96 03/02/2014 0835   AST 25 02/24/2024 1332   ALT 19 02/24/2024 1332   ALT 24 03/02/2014 0835   BILITOT 1.0 02/24/2024 1332       RADIOGRAPHIC STUDIES: I have personally reviewed the radiological images as listed and  agreed with the findings in the report. No results found.   ASSESSMENT & PLAN:  Polycythemia vera (HCC) # Polycythemia vera Jak 2+; on Hydrea 500 mg twice a day; except Saturday Sunday-extra pill.. June 2021-bone marrow biopsy persistent myeloproliferative disease; no evidence of transformation or leukemia. MAY 2023-ultrasound: Splenomegaly with a volume 1097 mL, compared to 1319 prior study from 01/22/2022.  AUG, 28th 2024- Spleen measures 17.2 x 7.4 x 18.6 cm cm. Volume: 1240.6 cc- overal stable. Monitof ro now. US spleen ordered for 4 months  # Today-hb 12.9- hematocrit is  43  HOLD phlebotomy today. Continue Hydrea.  # Left LE swelling- June 2024- ]No DVT identified.? Bakers cyst 1.8 x 0.5 x 4.5 cm- s/p steroid injection [[facility]]- improved. S/p meloxicam- stable.  #Disposition:  # HOLD  Phlebotomy today # in 1 months-H&H; possible phlebotomy. # in 11months-H&H; possible phlebotomy. # in 3 months-H&H; possible phlebotomy. # in 4 months- MD; cbc/cmp/LDH-possible phlebotomy; US spleen prior-- Dr.B   Orders Placed This Encounter  Procedures   US SPLEEN (ABDOMEN LIMITED)    Standing Status:   Future    Expected Date:   06/25/2024    Reason for Exam (SYMPTOM  OR DIAGNOSIS REQUIRED):   splneomegaly    Preferred imaging location?:   [facility]   All questions were answered. The patient knows to call the clinic with any problems, questions or concerns.      Earna Coder, MD 02/24/2024 2:39 PM

## 2024-02-24 NOTE — Assessment & Plan Note (Addendum)
#   Polycythemia vera Jak 2+; on Hydrea 500 mg twice a day; except Saturday Sunday-extra pill.. June 2021-bone marrow biopsy persistent myeloproliferative disease; no evidence of transformation or leukemia. MAY 2023-ultrasound: Splenomegaly with a volume 1097 mL, compared to 1319 prior study from 01/22/2022.  AUG, 28th 2024- Spleen measures 17.2 x 7.4 x 18.6 cm cm. Volume: 1240.6 cc- overal stable. Monitof ro now. US spleen ordered for 4 months  # Today-hb 12.9- hematocrit is  43  HOLD phlebotomy today. Continue Hydrea.  # Left LE swelling- June 2024- ]No DVT identified.? Bakers cyst 1.8 x 0.5 x 4.5 cm- s/p steroid injection [[facility]]- improved. S/p meloxicam- stable.  #Disposition:  # HOLD  Phlebotomy today # in 1 months-H&H; possible phlebotomy. # in 32months-H&H; possible phlebotomy. # in 3 months-H&H; possible phlebotomy. # in 4 months- MD; cbc/cmp/LDH-possible phlebotomy; US spleen prior-- Dr.B

## 2024-03-04 DIAGNOSIS — M79671 Pain in right foot: Secondary | ICD-10-CM | POA: Diagnosis not present

## 2024-03-04 DIAGNOSIS — M109 Gout, unspecified: Secondary | ICD-10-CM | POA: Diagnosis not present

## 2024-03-11 ENCOUNTER — Telehealth: Payer: Self-pay | Admitting: *Deleted

## 2024-03-11 ENCOUNTER — Other Ambulatory Visit: Payer: Self-pay | Admitting: *Deleted

## 2024-03-11 DIAGNOSIS — D45 Polycythemia vera: Secondary | ICD-10-CM

## 2024-03-11 NOTE — Telephone Encounter (Signed)
 He says that last time he had hct  and it was 43 and they did not get phlebotomy that day. He feels that not getting phlebotomy is causing the HA.,I spoke to [place] and he said that he can come over 1 day this week  and Britta Mccreedy  called to tell him the date and time

## 2024-03-11 NOTE — Progress Notes (Signed)
 Put in for lab for pt to come 3/28

## 2024-03-13 ENCOUNTER — Inpatient Hospital Stay

## 2024-03-13 VITALS — BP 135/79 | HR 89

## 2024-03-13 DIAGNOSIS — D45 Polycythemia vera: Secondary | ICD-10-CM

## 2024-03-13 DIAGNOSIS — R161 Splenomegaly, not elsewhere classified: Secondary | ICD-10-CM

## 2024-03-13 LAB — CBC WITH DIFFERENTIAL (CANCER CENTER ONLY)
Abs Immature Granulocytes: 0.16 10*3/uL — ABNORMAL HIGH (ref 0.00–0.07)
Basophils Absolute: 0.1 10*3/uL (ref 0.0–0.1)
Basophils Relative: 1 %
Eosinophils Absolute: 0.2 10*3/uL (ref 0.0–0.5)
Eosinophils Relative: 1 %
HCT: 42.9 % (ref 39.0–52.0)
Hemoglobin: 12.9 g/dL — ABNORMAL LOW (ref 13.0–17.0)
Immature Granulocytes: 1 %
Lymphocytes Relative: 7 %
Lymphs Abs: 0.9 10*3/uL (ref 0.7–4.0)
MCH: 27.6 pg (ref 26.0–34.0)
MCHC: 30.1 g/dL (ref 30.0–36.0)
MCV: 91.7 fL (ref 80.0–100.0)
Monocytes Absolute: 0.3 10*3/uL (ref 0.1–1.0)
Monocytes Relative: 2 %
Neutro Abs: 11.8 10*3/uL — ABNORMAL HIGH (ref 1.7–7.7)
Neutrophils Relative %: 88 %
Platelet Count: 300 10*3/uL (ref 150–400)
RBC: 4.68 MIL/uL (ref 4.22–5.81)
RDW: 16.6 % — ABNORMAL HIGH (ref 11.5–15.5)
WBC Count: 13.5 10*3/uL — ABNORMAL HIGH (ref 4.0–10.5)
nRBC: 0 % (ref 0.0–0.2)

## 2024-03-13 LAB — CMP (CANCER CENTER ONLY)
ALT: 17 U/L (ref 0–44)
AST: 19 U/L (ref 15–41)
Albumin: 3.9 g/dL (ref 3.5–5.0)
Alkaline Phosphatase: 95 U/L (ref 38–126)
Anion gap: 10 (ref 5–15)
BUN: 12 mg/dL (ref 8–23)
CO2: 24 mmol/L (ref 22–32)
Calcium: 8.4 mg/dL — ABNORMAL LOW (ref 8.9–10.3)
Chloride: 103 mmol/L (ref 98–111)
Creatinine: 0.83 mg/dL (ref 0.61–1.24)
GFR, Estimated: >60 mL/min (ref >60)
Glucose, Bld: 130 mg/dL — ABNORMAL HIGH (ref 70–99)
Potassium: 3.3 mmol/L — ABNORMAL LOW (ref 3.5–5.1)
Sodium: 137 mmol/L (ref 135–145)
Total Bilirubin: 0.7 mg/dL (ref 0.0–1.2)
Total Protein: 6.5 g/dL (ref 6.5–8.1)

## 2024-03-13 LAB — LACTATE DEHYDROGENASE: LDH: 216 U/L — ABNORMAL HIGH (ref 98–192)

## 2024-03-26 ENCOUNTER — Inpatient Hospital Stay: Attending: Internal Medicine

## 2024-03-26 ENCOUNTER — Inpatient Hospital Stay

## 2024-03-26 VITALS — BP 127/80 | HR 89 | Resp 18

## 2024-03-26 DIAGNOSIS — D45 Polycythemia vera: Secondary | ICD-10-CM | POA: Insufficient documentation

## 2024-03-26 LAB — HEMOGLOBIN AND HEMATOCRIT (CANCER CENTER ONLY)
HCT: 43.8 % (ref 39.0–52.0)
Hemoglobin: 12.2 g/dL — ABNORMAL LOW (ref 13.0–17.0)

## 2024-04-24 ENCOUNTER — Inpatient Hospital Stay

## 2024-04-24 ENCOUNTER — Telehealth: Payer: Self-pay | Admitting: *Deleted

## 2024-04-24 ENCOUNTER — Inpatient Hospital Stay: Attending: Internal Medicine

## 2024-04-24 DIAGNOSIS — D45 Polycythemia vera: Secondary | ICD-10-CM | POA: Diagnosis not present

## 2024-04-24 LAB — HEMOGLOBIN AND HEMATOCRIT (CANCER CENTER ONLY)
HCT: 38.6 % — ABNORMAL LOW (ref 39.0–52.0)
Hemoglobin: 11.6 g/dL — ABNORMAL LOW (ref 13.0–17.0)

## 2024-04-24 NOTE — Progress Notes (Signed)
No phlebotomy today.

## 2024-04-24 NOTE — Telephone Encounter (Signed)
 I called patient back and let him know that the 38.6 hematocrit is good.  Also he has stopped eating sugary candy as well as not eating red meat and instead he is doing chicken and [place].  Patient says he feels really good.

## 2024-05-21 ENCOUNTER — Inpatient Hospital Stay

## 2024-05-21 ENCOUNTER — Inpatient Hospital Stay: Attending: Internal Medicine

## 2024-05-21 DIAGNOSIS — D45 Polycythemia vera: Secondary | ICD-10-CM | POA: Diagnosis not present

## 2024-05-21 LAB — HEMOGLOBIN AND HEMATOCRIT (CANCER CENTER ONLY)
HCT: 40.4 % (ref 39.0–52.0)
Hemoglobin: 12.1 g/dL — ABNORMAL LOW (ref 13.0–17.0)

## 2024-05-21 NOTE — Progress Notes (Signed)
 Phlebotomy held- not needed. Hct 40.4

## 2024-05-26 ENCOUNTER — Other Ambulatory Visit

## 2024-05-26 ENCOUNTER — Encounter

## 2024-06-15 ENCOUNTER — Ambulatory Visit
Admission: RE | Admit: 2024-06-15 | Discharge: 2024-06-15 | Disposition: A | Discharge status: Home / Self Care | Source: Ambulatory Visit | Attending: Internal Medicine | Admitting: Internal Medicine

## 2024-06-15 DIAGNOSIS — R161 Splenomegaly, not elsewhere classified: Secondary | ICD-10-CM | POA: Diagnosis not present

## 2024-06-15 DIAGNOSIS — D45 Polycythemia vera: Secondary | ICD-10-CM | POA: Diagnosis not present

## 2024-06-23 ENCOUNTER — Other Ambulatory Visit: Payer: Self-pay | Admitting: *Deleted

## 2024-06-23 DIAGNOSIS — D45 Polycythemia vera: Secondary | ICD-10-CM

## 2024-06-24 ENCOUNTER — Inpatient Hospital Stay: Admitting: Internal Medicine

## 2024-06-24 ENCOUNTER — Encounter: Payer: Self-pay | Admitting: Internal Medicine

## 2024-06-24 ENCOUNTER — Inpatient Hospital Stay

## 2024-06-24 ENCOUNTER — Inpatient Hospital Stay: Attending: Internal Medicine

## 2024-06-24 VITALS — BP 130/77 | HR 84 | Temp 96.6°F | Resp 20 | Ht 68.0 in | Wt 197.5 lb

## 2024-06-24 DIAGNOSIS — D45 Polycythemia vera: Secondary | ICD-10-CM | POA: Insufficient documentation

## 2024-06-24 LAB — CBC WITH DIFFERENTIAL (CANCER CENTER ONLY)
Abs Immature Granulocytes: 0.14 K/uL — ABNORMAL HIGH (ref 0.00–0.07)
Basophils Absolute: 0.1 K/uL (ref 0.0–0.1)
Basophils Relative: 1 %
Eosinophils Absolute: 0.1 K/uL (ref 0.0–0.5)
Eosinophils Relative: 1 %
HCT: 39.8 % (ref 39.0–52.0)
Hemoglobin: 12.1 g/dL — ABNORMAL LOW (ref 13.0–17.0)
Immature Granulocytes: 1 %
Lymphocytes Relative: 7 %
Lymphs Abs: 0.8 K/uL (ref 0.7–4.0)
MCH: 27.4 pg (ref 26.0–34.0)
MCHC: 30.4 g/dL (ref 30.0–36.0)
MCV: 90.2 fL (ref 80.0–100.0)
Monocytes Absolute: 0.2 K/uL (ref 0.1–1.0)
Monocytes Relative: 2 %
Neutro Abs: 10.7 K/uL — ABNORMAL HIGH (ref 1.7–7.7)
Neutrophils Relative %: 88 %
Platelet Count: 206 K/uL (ref 150–400)
RBC: 4.41 MIL/uL (ref 4.22–5.81)
RDW: 15.9 % — ABNORMAL HIGH (ref 11.5–15.5)
Smear Review: NORMAL
WBC Count: 12.2 K/uL — ABNORMAL HIGH (ref 4.0–10.5)
nRBC: 0 % (ref 0.0–0.2)

## 2024-06-24 LAB — CMP (CANCER CENTER ONLY)
ALT: 18 U/L (ref 0–44)
AST: 25 U/L (ref 15–41)
Albumin: 4.1 g/dL (ref 3.5–5.0)
Alkaline Phosphatase: 95 U/L (ref 38–126)
Anion gap: 8 (ref 5–15)
BUN: 11 mg/dL (ref 8–23)
CO2: 25 mmol/L (ref 22–32)
Calcium: 8.7 mg/dL — ABNORMAL LOW (ref 8.9–10.3)
Chloride: 105 mmol/L (ref 98–111)
Creatinine: 0.93 mg/dL (ref 0.61–1.24)
GFR, Estimated: >60 mL/min (ref >60)
Glucose, Bld: 148 mg/dL — ABNORMAL HIGH (ref 70–99)
Potassium: 4 mmol/L (ref 3.5–5.1)
Sodium: 138 mmol/L (ref 135–145)
Total Bilirubin: 1 mg/dL (ref 0.0–1.2)
Total Protein: 6.7 g/dL (ref 6.5–8.1)

## 2024-06-24 LAB — LACTATE DEHYDROGENASE: LDH: 203 U/L — ABNORMAL HIGH (ref 98–192)

## 2024-06-24 NOTE — Progress Notes (Signed)
No phlebotomy today.

## 2024-06-24 NOTE — Progress Notes (Signed)
 [facility] OFFICE PROGRESS NOTE  Patient Care Team: Alla Amis, MD as PCP - General (Family Medicine) Rennie Cindy SAUNDERS, MD as Consulting Physician (Internal Medicine)   Cancer Staging  No matching staging information was found for the patient.    Oncology History Overview Note  # 2014- POLYCYTHEMIA VERA - JAK-2 Positive; on Hydrea  500mg /day; aspirin  81 mg a day; phlebotomy for Hct >43 [headaches];   # MAY 2019- BMBx- pan-hypercellular; no evidence of significant fibrosis; MAY US - 1389 cc/ splenomegaly; Oct 11th 2019- Increase in splenomegaly; Increased Hydrea  1500mg /day.   # nonarteritic ischemic optic neuropathy-Left eye [may 2018; Dr.Dingledein/Dr.Sitko at [facility]]; Previous Right eye ---------------------------------------    DIAGNOSIS: [ ]  POLYCYTHEMIA VERA  ;GOALS: control  CURRENT/MOST RECENT THERAPY [ ]  Hydrea         Polycythemia vera (HCC)     INTERVAL HISTORY: ambulating independently; alone.   Derek [name] 68 y.o.  male pleasant patient above history of polycythemia vera on hydrea  is here for follow-up/ and review the results of the US  spleen.   Patient denies any abdominal pain or early satiety.  Denies any unintentional  weight loss.  Denies any night sweats.  No fatigue. Patient admits compliance to Hydrea .  Review of Systems  Constitutional:  Negative for chills, diaphoresis and weight loss.  HENT:  Negative for nosebleeds and sore throat.   Eyes:  Negative for double vision.  Respiratory:  Negative for cough, hemoptysis, sputum production, shortness of breath and wheezing.   Cardiovascular:  Negative for chest pain, palpitations, orthopnea and leg swelling.  Gastrointestinal:  Negative for blood in stool, constipation, diarrhea, heartburn, melena, nausea and vomiting.  Genitourinary:  Negative for dysuria, frequency and urgency.  Musculoskeletal:  Positive for back pain and joint pain.  Skin: Negative.  Negative for itching and  rash.  Neurological:  Negative for dizziness, tingling, focal weakness, weakness and headaches.  Endo/Heme/Allergies:  Does not bruise/bleed easily.  Psychiatric/Behavioral:  Negative for depression. The patient is not nervous/anxious and does not have insomnia.       PAST MEDICAL HISTORY :  Past Medical History:  Diagnosis Date   Arthritis    GERD (gastroesophageal reflux disease)    Gout    Low serum testosterone level    Polycythemia vera (HCC)    Polycythemia, secondary 06/08/2015   Pulmonary nodules    Thoracic spine fracture (HCC)    compression fracture following a fall    PAST SURGICAL HISTORY :   Past Surgical History:  Procedure Laterality Date   COLONOSCOPY  2013    FAMILY HISTORY :   Family History  Problem Relation Age of Onset   Throat cancer Other        uncle   Migraines Neg Hx    Multiple sclerosis Neg Hx    Neurofibromatosis Neg Hx    Parkinsonism Neg Hx    Seizures Neg Hx    Neuropathy Neg Hx     SOCIAL HISTORY:   Social History   Tobacco Use   Smoking status: Never   Smokeless tobacco: Never  Vaping Use   Vaping status: Never Used  Substance Use Topics   Alcohol use: Yes    Alcohol/week: 0.0 standard drinks of alcohol    Comment: occasional alcohol use   Drug use: No    ALLERGIES:  is allergic to penicillin g.  MEDICATIONS:  Current Outpatient Medications  Medication Sig Dispense Refill   acetaminophen  (TYLENOL ) 500 MG tablet Take 500 mg by mouth every 6 (  six) hours as needed for fever.     amLODipine  (NORVASC ) 5 MG tablet Take 5 mg daily by mouth.  11   aspirin  EC 81 MG tablet Take 81 mg daily by mouth.      Cholecalciferol 25 MCG (1000 UT) tablet Take 1,000 Units daily by mouth.      Esomeprazole Magnesium (NEXIUM PO) Take 1 capsule by mouth daily.     hydroxyurea  (HYDREA ) 500 MG capsule TAKE 1 CAPSULE BY MOUTH TWICE A DAY EXCEPT SATURDAY AND SUNDAY (TAKE AN EXTRA PILL ON SAT/SUNDAY) MAY TAKE WITH FOOD TO MINIMIZE GI SIDE EFFECTS.  210 capsule 1   Multiple Vitamins-Minerals (MULTIVITAMIN ADULT PO) Take 1 tablet by mouth 1 day or 1 dose.     ondansetron  (ZOFRAN  ODT) 4 MG disintegrating tablet Take 1 tablet (4 mg total) by mouth every 8 (eight) hours as needed for nausea or vomiting. (Patient not taking: Reported on 06/24/2024) 20 tablet 0   No current facility-administered medications for this visit.    PHYSICAL EXAMINATION: ECOG PERFORMANCE STATUS: 0 - Asymptomatic  BP 130/77 (BP Location: Right Arm, Patient Position: Sitting, Cuff Size: Normal)   Pulse 84   Temp (!) 96.6 F (35.9 C) (Tympanic)   Resp 20   Ht 5' 8 (1.727 m)   Wt 197 lb 8 oz (89.6 kg)   SpO2 98%   BMI 30.03 kg/m   Filed Weights   06/24/24 1255  Weight: 197 lb 8 oz (89.6 kg)     Positive for splenomegaly.   Physical Exam HENT:     Head: Normocephalic and atraumatic.     Mouth/Throat:     Pharynx: No oropharyngeal exudate.  Eyes:     Pupils: Pupils are equal, round, and reactive to light.  Cardiovascular:     Rate and Rhythm: Normal rate and regular rhythm.  Pulmonary:     Effort: No respiratory distress.     Breath sounds: No wheezing.  Abdominal:     General: Bowel sounds are normal. There is no distension.     Palpations: Abdomen is soft. There is no mass.     Tenderness: There is no abdominal tenderness. There is no guarding or rebound.  Musculoskeletal:        General: No tenderness. Normal range of motion.     Cervical back: Normal range of motion and neck supple.  Skin:    General: Skin is warm.  Neurological:     Mental Status: He is alert and oriented to person, place, and time.  Psychiatric:        Mood and Affect: Affect normal.     LABORATORY DATA:  I have reviewed the data as listed    Component Value Date/Time   NA 138 06/24/2024 1247   NA 140 03/02/2014 0835   K 4.0 06/24/2024 1247   K 3.8 03/02/2014 0835   CL 105 06/24/2024 1247   CL 109 (H) 03/02/2014 0835   CO2 25 06/24/2024 1247   CO2 26  03/02/2014 0835   GLUCOSE 148 (H) 06/24/2024 1247   GLUCOSE 108 (H) 03/02/2014 0835   BUN 11 06/24/2024 1247   BUN 9 03/02/2014 0835   CREATININE 0.93 06/24/2024 1247   CREATININE 0.99 03/02/2014 0835   CALCIUM 8.7 (L) 06/24/2024 1247   CALCIUM 8.6 03/02/2014 0835   PROT 6.7 06/24/2024 1247   PROT 7.0 03/02/2014 0835   ALBUMIN 4.1 06/24/2024 1247   ALBUMIN 3.7 03/02/2014 0835   AST 25 06/24/2024 1247  ALT 18 06/24/2024 1247   ALT 24 03/02/2014 0835   ALKPHOS 95 06/24/2024 1247   ALKPHOS 96 03/02/2014 0835   BILITOT 1.0 06/24/2024 1247   GFRNONAA >60 06/24/2024 1247   GFRNONAA >60 03/02/2014 0835   GFRAA >60 08/15/2020 0953   GFRAA >60 03/02/2014 0835    No results found for: SPEP, UPEP  Lab Results  Component Value Date   WBC 12.2 (H) 06/24/2024   NEUTROABS 10.7 (H) 06/24/2024   HGB 12.1 (L) 06/24/2024   HCT 39.8 06/24/2024   MCV 90.2 06/24/2024   PLT 206 06/24/2024      Chemistry      Component Value Date/Time   NA 138 06/24/2024 1247   NA 140 03/02/2014 0835   K 4.0 06/24/2024 1247   K 3.8 03/02/2014 0835   CL 105 06/24/2024 1247   CL 109 (H) 03/02/2014 0835   CO2 25 06/24/2024 1247   CO2 26 03/02/2014 0835   BUN 11 06/24/2024 1247   BUN 9 03/02/2014 0835   CREATININE 0.93 06/24/2024 1247   CREATININE 0.99 03/02/2014 0835      Component Value Date/Time   CALCIUM 8.7 (L) 06/24/2024 1247   CALCIUM 8.6 03/02/2014 0835   ALKPHOS 95 06/24/2024 1247   ALKPHOS 96 03/02/2014 0835   AST 25 06/24/2024 1247   ALT 18 06/24/2024 1247   ALT 24 03/02/2014 0835   BILITOT 1.0 06/24/2024 1247       RADIOGRAPHIC STUDIES: I have personally reviewed the radiological images as listed and agreed with the findings in the report. No results found.   ASSESSMENT & PLAN:  Polycythemia vera (HCC) # Polycythemia vera Jak 2+; on Hydrea - June 2021-bone marrow biopsy persistent myeloproliferative disease; no evidence of transformation or leukemia. MAY 2023-ultrasound:  Splenomegaly with a volume 1097 mL, compared to 1319 prior study from 01/22/2022.  June 30th, 2025- Homogeneous splenic parenchyma by ultrasound. No adjacent fluid. Spleen is enlarged at 17.2 x 8.1 x 18.6 cm today. Volume of 1363.5 cc. Previous measurement of 17.2 x 7.4 x 18.6 cm with volume of 1240.6 cc- Over all stable. Monitor for now. US  spleen ordered for 6 months   # Today-hb 12.9- hematocrit is  43  HOLD phlebotomy today. Continue  500 mg twice a day; except Saturday Sunday-extra pill- asprin 81 mg/day.   #Disposition:  # HOLD  Phlebotomy today # in 1 months-H&H; possible phlebotomy. # in 32months-H&H; possible phlebotomy. # in 3 months-H&H; possible phlebotomy. # in 4 months- MD; cbc/cmp/LDH-possible phlebotomy- Dr.B   Orders Placed This Encounter  Procedures   CBC with Differential (Cancer Center Only)    Standing Status:   Future    Expected Date:   10/21/2024    Expiration Date:   01/19/2025   CMP (Cancer Center only)    Standing Status:   Future    Expected Date:   10/21/2024    Expiration Date:   01/19/2025   Lactate dehydrogenase    Standing Status:   Future    Expected Date:   10/21/2024    Expiration Date:   01/19/2025   All questions were answered. The patient knows to call the clinic with any problems, questions or concerns.      Cindy JONELLE Joe, MD 06/24/2024 1:36 PM

## 2024-06-24 NOTE — Assessment & Plan Note (Addendum)
#   Polycythemia vera Jak 2+; on Hydrea - June 2021-bone marrow biopsy persistent myeloproliferative disease; no evidence of transformation or leukemia. MAY 2023-ultrasound: Splenomegaly with a volume 1097 mL, compared to 1319 prior study from 01/22/2022.  June 30th, 2025- Homogeneous splenic parenchyma by ultrasound. No adjacent fluid. Spleen is enlarged at 17.2 x 8.1 x 18.6 cm today. Volume of 1363.5 cc. Previous measurement of 17.2 x 7.4 x 18.6 cm with volume of 1240.6 cc- Over all stable. Monitor for now. US  spleen ordered for 6 months   # Today-hb 12.9- hematocrit is  43  HOLD phlebotomy today. Continue  500 mg twice a day; except Saturday Sunday-extra pill- asprin 81 mg/day.   #Disposition:  # HOLD  Phlebotomy today # in 1 months-H&H; possible phlebotomy. # in 51months-H&H; possible phlebotomy. # in 3 months-H&H; possible phlebotomy. # in 4 months- MD; cbc/cmp/LDH-possible phlebotomy- Dr.B

## 2024-06-24 NOTE — Progress Notes (Signed)
 U/S spleen 06/15/24.

## 2024-07-23 DIAGNOSIS — I10 Essential (primary) hypertension: Secondary | ICD-10-CM | POA: Diagnosis not present

## 2024-07-23 DIAGNOSIS — Z136 Encounter for screening for cardiovascular disorders: Secondary | ICD-10-CM | POA: Diagnosis not present

## 2024-07-23 DIAGNOSIS — Z8739 Personal history of other diseases of the musculoskeletal system and connective tissue: Secondary | ICD-10-CM | POA: Diagnosis not present

## 2024-07-24 ENCOUNTER — Inpatient Hospital Stay

## 2024-07-24 ENCOUNTER — Inpatient Hospital Stay: Attending: Internal Medicine

## 2024-07-24 DIAGNOSIS — D45 Polycythemia vera: Secondary | ICD-10-CM | POA: Insufficient documentation

## 2024-07-24 LAB — HEMOGLOBIN AND HEMATOCRIT (CANCER CENTER ONLY)
HCT: 39.7 % (ref 39.0–52.0)
Hemoglobin: 12 g/dL — ABNORMAL LOW (ref 13.0–17.0)

## 2024-07-24 NOTE — Progress Notes (Signed)
 No phlebotomy today does not meet parameters for phlebotomy.

## 2024-07-30 DIAGNOSIS — E119 Type 2 diabetes mellitus without complications: Secondary | ICD-10-CM | POA: Diagnosis not present

## 2024-07-30 DIAGNOSIS — I10 Essential (primary) hypertension: Secondary | ICD-10-CM | POA: Diagnosis not present

## 2024-07-30 DIAGNOSIS — Z0000 Encounter for general adult medical examination without abnormal findings: Secondary | ICD-10-CM | POA: Diagnosis not present

## 2024-07-30 DIAGNOSIS — Z1331 Encounter for screening for depression: Secondary | ICD-10-CM | POA: Diagnosis not present

## 2024-08-06 ENCOUNTER — Other Ambulatory Visit: Payer: Self-pay | Admitting: Internal Medicine

## 2024-08-06 DIAGNOSIS — D45 Polycythemia vera: Secondary | ICD-10-CM

## 2024-08-15 DIAGNOSIS — M79671 Pain in right foot: Secondary | ICD-10-CM | POA: Diagnosis not present

## 2024-08-15 DIAGNOSIS — Z8739 Personal history of other diseases of the musculoskeletal system and connective tissue: Secondary | ICD-10-CM | POA: Diagnosis not present

## 2024-08-25 ENCOUNTER — Inpatient Hospital Stay

## 2024-08-25 ENCOUNTER — Inpatient Hospital Stay: Attending: Internal Medicine

## 2024-08-25 DIAGNOSIS — D45 Polycythemia vera: Secondary | ICD-10-CM | POA: Insufficient documentation

## 2024-08-25 LAB — HEMOGLOBIN AND HEMATOCRIT (CANCER CENTER ONLY)
HCT: 39.1 % (ref 39.0–52.0)
Hemoglobin: 12 g/dL — ABNORMAL LOW (ref 13.0–17.0)

## 2024-08-25 NOTE — Progress Notes (Signed)
 HCT 39.1, no phlebotomy today. Patient aware and discharged.

## 2024-09-16 DIAGNOSIS — M7989 Other specified soft tissue disorders: Secondary | ICD-10-CM | POA: Diagnosis not present

## 2024-09-16 DIAGNOSIS — M109 Gout, unspecified: Secondary | ICD-10-CM | POA: Diagnosis not present

## 2024-09-16 DIAGNOSIS — M79672 Pain in left foot: Secondary | ICD-10-CM | POA: Diagnosis not present

## 2024-09-16 DIAGNOSIS — M79671 Pain in right foot: Secondary | ICD-10-CM | POA: Diagnosis not present

## 2024-09-24 ENCOUNTER — Inpatient Hospital Stay

## 2024-09-24 ENCOUNTER — Inpatient Hospital Stay: Attending: Internal Medicine

## 2024-09-24 DIAGNOSIS — D45 Polycythemia vera: Secondary | ICD-10-CM | POA: Diagnosis not present

## 2024-09-24 LAB — HEMOGLOBIN AND HEMATOCRIT (CANCER CENTER ONLY)
HCT: 41.9 % (ref 39.0–52.0)
Hemoglobin: 12.6 g/dL — ABNORMAL LOW (ref 13.0–17.0)

## 2024-09-24 NOTE — Progress Notes (Signed)
 No phlebotomy needed

## 2024-10-27 ENCOUNTER — Inpatient Hospital Stay

## 2024-10-27 ENCOUNTER — Encounter: Payer: Self-pay | Admitting: Internal Medicine

## 2024-10-27 ENCOUNTER — Inpatient Hospital Stay: Admitting: Internal Medicine

## 2024-10-27 ENCOUNTER — Inpatient Hospital Stay: Attending: Internal Medicine

## 2024-10-27 VITALS — BP 138/78 | HR 75 | Temp 97.4°F | Resp 20 | Ht 68.0 in | Wt 184.7 lb

## 2024-10-27 VITALS — BP 134/67 | HR 78 | Resp 18

## 2024-10-27 DIAGNOSIS — D45 Polycythemia vera: Secondary | ICD-10-CM | POA: Diagnosis not present

## 2024-10-27 LAB — CMP (CANCER CENTER ONLY)
ALT: 16 U/L (ref 0–44)
AST: 22 U/L (ref 15–41)
Albumin: 4.2 g/dL (ref 3.5–5.0)
Alkaline Phosphatase: 89 U/L (ref 38–126)
Anion gap: 8 (ref 5–15)
BUN: 14 mg/dL (ref 8–23)
CO2: 25 mmol/L (ref 22–32)
Calcium: 8.9 mg/dL (ref 8.9–10.3)
Chloride: 104 mmol/L (ref 98–111)
Creatinine: 0.74 mg/dL (ref 0.61–1.24)
GFR, Estimated: >60 mL/min (ref >60)
Glucose, Bld: 97 mg/dL (ref 70–99)
Potassium: 4.2 mmol/L (ref 3.5–5.1)
Sodium: 137 mmol/L (ref 135–145)
Total Bilirubin: 1 mg/dL (ref 0.0–1.2)
Total Protein: 6.7 g/dL (ref 6.5–8.1)

## 2024-10-27 LAB — CBC WITH DIFFERENTIAL (CANCER CENTER ONLY)
Abs Immature Granulocytes: 0.08 K/uL — ABNORMAL HIGH (ref 0.00–0.07)
Basophils Absolute: 0.1 K/uL (ref 0.0–0.1)
Basophils Relative: 1 %
Eosinophils Absolute: 0.2 K/uL (ref 0.0–0.5)
Eosinophils Relative: 2 %
HCT: 43.8 % (ref 39.0–52.0)
Hemoglobin: 13.3 g/dL (ref 13.0–17.0)
Immature Granulocytes: 1 %
Lymphocytes Relative: 8 %
Lymphs Abs: 0.7 K/uL (ref 0.7–4.0)
MCH: 28.5 pg (ref 26.0–34.0)
MCHC: 30.4 g/dL (ref 30.0–36.0)
MCV: 93.8 fL (ref 80.0–100.0)
Monocytes Absolute: 0.2 K/uL (ref 0.1–1.0)
Monocytes Relative: 2 %
Neutro Abs: 7.3 K/uL (ref 1.7–7.7)
Neutrophils Relative %: 86 %
Platelet Count: 162 K/uL (ref 150–400)
RBC: 4.67 MIL/uL (ref 4.22–5.81)
RDW: 16.2 % — ABNORMAL HIGH (ref 11.5–15.5)
WBC Count: 8.6 K/uL (ref 4.0–10.5)
nRBC: 0 % (ref 0.0–0.2)

## 2024-10-27 LAB — LACTATE DEHYDROGENASE: LDH: 174 U/L (ref 105–235)

## 2024-10-27 NOTE — Assessment & Plan Note (Addendum)
#   Polycythemia vera Jak 2+; on Hydrea - June 2021-bone marrow biopsy persistent myeloproliferative disease; no evidence of transformation or leukemia. MAY 2023-ultrasound: Splenomegaly with a volume 1097 mL, compared to 1319 prior study from 01/22/2022.  June 30th, 2025- Homogeneous splenic parenchyma by ultrasound. No adjacent fluid. Spleen is enlarged at 17.2 x 8.1 x 18.6 cm today. Volume of 1363.5 cc. Previous measurement of 17.2 x 7.4 x 18.6 cm with volume of 1240.6 cc- Over all stable. Monitor for now. US  spleen ordered for 6 months   # Today- hematocrit is  43.8 . Proceed with phlebotomy today. Continue  500 mg twice a day; except Saturday Sunday-extra pill- asprin 81 mg/day.   #Disposition:  # Phlebotomy today # in 1 months-H&H; possible phlebotomy. # in 69months-H&H; possible phlebotomy. # in 3 months-H&H; possible phlebotomy. # in 4 months- MD; cbc/cmp/LDH-possible phlebotomy- Dr.B

## 2024-10-27 NOTE — Patient Instructions (Signed)

## 2024-10-27 NOTE — Progress Notes (Signed)
 [facility] OFFICE PROGRESS NOTE  Patient Care Team: Alla Amis, MD as PCP - General (Family Medicine) Rennie Cindy SAUNDERS, MD as Consulting Physician (Internal Medicine)   Cancer Staging  No matching staging information was found for the patient.    Oncology History Overview Note  # 2014- POLYCYTHEMIA VERA - JAK-2 Positive; on Hydrea  500mg /day; aspirin  81 mg a day; phlebotomy for Hct >43 [headaches];   # MAY 2019- BMBx- pan-hypercellular; no evidence of significant fibrosis; MAY US - 1389 cc/ splenomegaly; Oct 11th 2019- Increase in splenomegaly; Increased Hydrea  1500mg /day.   # nonarteritic ischemic optic neuropathy-Left eye [may 2018; Dr.Dingledein/Dr.Sitko at [facility]]; Previous Right eye ---------------------------------------    DIAGNOSIS: [ ]  POLYCYTHEMIA VERA  ;GOALS: control  CURRENT/MOST RECENT THERAPY [ ]  Hydrea         Polycythemia vera (HCC)     INTERVAL HISTORY: ambulating independently; alone.   Derek [name] 68 y.o.  male pleasant patient above history of polycythemia vera on hydrea  is here for follow-up.  Discussed the use of AI scribe software for clinical note transcription with the patient, who gave verbal consent to proceed.  History of Present Illness   Derek [name] is a 68 year old male with diabetes who presents for follow-up of his hematocrit levels.  He has diabetes with blood sugar levels typically ranging from 110 to 120 mg/dL. He has made dietary changes, such as eliminating bread and sugar and switching to zero-calorie beverages, resulting in a weight loss of 10 pounds, from 197 pounds in July to 184 pounds currently.  His hematocrit levels are currently at 43.8%. He has a history of requiring phlebotomies when his hematocrit levels were higher, with the last recorded level being 46% in January. He has not undergone phlebotomy in the last three visits. He sometimes feels better after a phlebotomy, but not always.  He has  been doing monthly blood checks.       Patient denies any abdominal pain or early satiety.  Denies any unintentional  weight loss.  Denies any night sweats.  No fatigue. Patient admits compliance to Hydrea . Review of Systems  Constitutional:  Negative for chills, diaphoresis and weight loss.  HENT:  Negative for nosebleeds and sore throat.   Eyes:  Negative for double vision.  Respiratory:  Negative for cough, hemoptysis, sputum production, shortness of breath and wheezing.   Cardiovascular:  Negative for chest pain, palpitations, orthopnea and leg swelling.  Gastrointestinal:  Negative for blood in stool, constipation, diarrhea, heartburn, melena, nausea and vomiting.  Genitourinary:  Negative for dysuria, frequency and urgency.  Musculoskeletal:  Positive for back pain and joint pain.  Skin: Negative.  Negative for itching and rash.  Neurological:  Negative for dizziness, tingling, focal weakness, weakness and headaches.  Endo/Heme/Allergies:  Does not bruise/bleed easily.  Psychiatric/Behavioral:  Negative for depression. The patient is not nervous/anxious and does not have insomnia.       PAST MEDICAL HISTORY :  Past Medical History:  Diagnosis Date   Arthritis    GERD (gastroesophageal reflux disease)    Gout    Low serum testosterone level    Polycythemia vera (HCC)    Polycythemia, secondary 06/08/2015   Pulmonary nodules    Thoracic spine fracture (HCC)    compression fracture following a fall    PAST SURGICAL HISTORY :   Past Surgical History:  Procedure Laterality Date   COLONOSCOPY  2013    FAMILY HISTORY :   Family History  Problem Relation Age of Onset  Throat cancer Other        uncle   Migraines Neg Hx    Multiple sclerosis Neg Hx    Neurofibromatosis Neg Hx    Parkinsonism Neg Hx    Seizures Neg Hx    Neuropathy Neg Hx     SOCIAL HISTORY:   Social History   Tobacco Use   Smoking status: Never   Smokeless tobacco: Never  Vaping Use   Vaping  status: Never Used  Substance Use Topics   Alcohol use: Yes    Alcohol/week: 0.0 standard drinks of alcohol    Comment: occasional alcohol use   Drug use: No    ALLERGIES:  is allergic to penicillin g.  MEDICATIONS:  Current Outpatient Medications  Medication Sig Dispense Refill   acetaminophen  (TYLENOL ) 500 MG tablet Take 500 mg by mouth every 6 (six) hours as needed for fever.     amLODipine  (NORVASC ) 5 MG tablet Take 5 mg daily by mouth.  11   aspirin  EC 81 MG tablet Take 81 mg daily by mouth.      Cholecalciferol 25 MCG (1000 UT) tablet Take 1,000 Units daily by mouth.      Esomeprazole Magnesium (NEXIUM PO) Take 1 capsule by mouth daily.     hydroxyurea  (HYDREA ) 500 MG capsule TAKE 1 CAPSULE BY MOUTH TWICE A DAY EXCEPT SATURDAY AND SUNDAY (TAKE AN EXTRA PILL ON SAT/SUNDAY) MAY TAKE WITH FOOD TO MINIMIZE GI SIDE EFFECTS. 210 capsule 1   metFORMIN (GLUCOPHAGE-XR) 500 MG 24 hr tablet daily with breakfast.     Multiple Vitamins-Minerals (MULTIVITAMIN ADULT PO) Take 1 tablet by mouth 1 day or 1 dose.     No current facility-administered medications for this visit.    PHYSICAL EXAMINATION: ECOG PERFORMANCE STATUS: 0 - Asymptomatic  BP 138/78 (BP Location: Left Arm, Patient Position: Sitting, Cuff Size: Large)   Pulse 75   Temp (!) 97.4 F (36.3 C) (Tympanic)   Resp 20   Ht 5' 8 (1.727 m)   Wt 184 lb 11.2 oz (83.8 kg)   SpO2 99%   BMI 28.08 kg/m   Filed Weights   10/27/24 1429  Weight: 184 lb 11.2 oz (83.8 kg)     Positive for splenomegaly.   Physical Exam HENT:     Head: Normocephalic and atraumatic.     Mouth/Throat:     Pharynx: No oropharyngeal exudate.  Eyes:     Pupils: Pupils are equal, round, and reactive to light.  Cardiovascular:     Rate and Rhythm: Normal rate and regular rhythm.  Pulmonary:     Effort: No respiratory distress.     Breath sounds: No wheezing.  Abdominal:     General: Bowel sounds are normal. There is no distension.      Palpations: Abdomen is soft. There is no mass.     Tenderness: There is no abdominal tenderness. There is no guarding or rebound.  Musculoskeletal:        General: No tenderness. Normal range of motion.     Cervical back: Normal range of motion and neck supple.  Skin:    General: Skin is warm.  Neurological:     Mental Status: He is alert and oriented to person, place, and time.  Psychiatric:        Mood and Affect: Affect normal.     LABORATORY DATA:  I have reviewed the data as listed    Component Value Date/Time   NA 137 10/27/2024 1431   NA  140 03/02/2014 0835   K 4.2 10/27/2024 1431   K 3.8 03/02/2014 0835   CL 104 10/27/2024 1431   CL 109 (H) 03/02/2014 0835   CO2 25 10/27/2024 1431   CO2 26 03/02/2014 0835   GLUCOSE 97 10/27/2024 1431   GLUCOSE 108 (H) 03/02/2014 0835   BUN 14 10/27/2024 1431   BUN 9 03/02/2014 0835   CREATININE 0.74 10/27/2024 1431   CREATININE 0.99 03/02/2014 0835   CALCIUM 8.9 10/27/2024 1431   CALCIUM 8.6 03/02/2014 0835   PROT 6.7 10/27/2024 1431   PROT 7.0 03/02/2014 0835   ALBUMIN 4.2 10/27/2024 1431   ALBUMIN 3.7 03/02/2014 0835   AST 22 10/27/2024 1431   ALT 16 10/27/2024 1431   ALT 24 03/02/2014 0835   ALKPHOS 89 10/27/2024 1431   ALKPHOS 96 03/02/2014 0835   BILITOT 1.0 10/27/2024 1431   GFRNONAA >60 10/27/2024 1431   GFRNONAA >60 03/02/2014 0835   GFRAA >60 08/15/2020 0953   GFRAA >60 03/02/2014 0835    No results found for: SPEP, UPEP  Lab Results  Component Value Date   WBC 8.6 10/27/2024   NEUTROABS 7.3 10/27/2024   HGB 13.3 10/27/2024   HCT 43.8 10/27/2024   MCV 93.8 10/27/2024   PLT 162 10/27/2024      Chemistry      Component Value Date/Time   NA 137 10/27/2024 1431   NA 140 03/02/2014 0835   K 4.2 10/27/2024 1431   K 3.8 03/02/2014 0835   CL 104 10/27/2024 1431   CL 109 (H) 03/02/2014 0835   CO2 25 10/27/2024 1431   CO2 26 03/02/2014 0835   BUN 14 10/27/2024 1431   BUN 9 03/02/2014 0835    CREATININE 0.74 10/27/2024 1431   CREATININE 0.99 03/02/2014 0835      Component Value Date/Time   CALCIUM 8.9 10/27/2024 1431   CALCIUM 8.6 03/02/2014 0835   ALKPHOS 89 10/27/2024 1431   ALKPHOS 96 03/02/2014 0835   AST 22 10/27/2024 1431   ALT 16 10/27/2024 1431   ALT 24 03/02/2014 0835   BILITOT 1.0 10/27/2024 1431       RADIOGRAPHIC STUDIES: I have personally reviewed the radiological images as listed and agreed with the findings in the report. No results found.   ASSESSMENT & PLAN:  Polycythemia vera (HCC) # Polycythemia vera Jak 2+; on Hydrea - June 2021-bone marrow biopsy persistent myeloproliferative disease; no evidence of transformation or leukemia. MAY 2023-ultrasound: Splenomegaly with a volume 1097 mL, compared to 1319 prior study from 01/22/2022.  June 30th, 2025- Homogeneous splenic parenchyma by ultrasound. No adjacent fluid. Spleen is enlarged at 17.2 x 8.1 x 18.6 cm today. Volume of 1363.5 cc. Previous measurement of 17.2 x 7.4 x 18.6 cm with volume of 1240.6 cc- Over all stable. Monitor for now. US  spleen ordered for 6 months   # Today- hematocrit is  43.8 . Proceed with phlebotomy today. Continue  500 mg twice a day; except Saturday Sunday-extra pill- asprin 81 mg/day.   #Disposition:  # Phlebotomy today # in 1 months-H&H; possible phlebotomy. # in 21months-H&H; possible phlebotomy. # in 3 months-H&H; possible phlebotomy. # in 4 months- MD; cbc/cmp/LDH-possible phlebotomy- Dr.B   Orders Placed This Encounter  Procedures   CBC with Differential (Cancer Center Only)    Standing Status:   Future    Expected Date:   02/24/2025    Expiration Date:   05/25/2025   CMP (Cancer Center only)    Standing Status:  Future    Expected Date:   02/24/2025    Expiration Date:   05/25/2025   Lactate dehydrogenase    Standing Status:   Future    Expected Date:   02/24/2025    Expiration Date:   05/25/2025   All questions were answered. The patient knows to call the clinic with  any problems, questions or concerns.      Cindy JONELLE Joe, MD 10/27/2024 3:05 PM

## 2024-10-27 NOTE — Progress Notes (Signed)
 Pt dx with diabetes, on metformin, unsure of dosage, every day.

## 2024-10-27 NOTE — Progress Notes (Signed)
 Derek [name] presents today for phlebotomy per MD orders. Phlebotomy procedure started at 1514 and ended at 1525. 350 mls removed. Patient tolerated procedure well. IV needle removed intact.

## 2024-10-30 DIAGNOSIS — M19172 Post-traumatic osteoarthritis, left ankle and foot: Secondary | ICD-10-CM | POA: Diagnosis not present

## 2024-11-11 DIAGNOSIS — E119 Type 2 diabetes mellitus without complications: Secondary | ICD-10-CM | POA: Diagnosis not present

## 2024-11-11 DIAGNOSIS — I10 Essential (primary) hypertension: Secondary | ICD-10-CM | POA: Diagnosis not present

## 2024-11-11 DIAGNOSIS — Z8739 Personal history of other diseases of the musculoskeletal system and connective tissue: Secondary | ICD-10-CM | POA: Diagnosis not present

## 2024-11-26 ENCOUNTER — Inpatient Hospital Stay: Attending: Internal Medicine

## 2024-11-26 ENCOUNTER — Inpatient Hospital Stay

## 2024-11-26 DIAGNOSIS — D45 Polycythemia vera: Secondary | ICD-10-CM

## 2024-11-26 LAB — HEMOGLOBIN AND HEMATOCRIT (CANCER CENTER ONLY)
HCT: 40 % (ref 39.0–52.0)
Hemoglobin: 12.3 g/dL — ABNORMAL LOW (ref 13.0–17.0)

## 2024-11-26 NOTE — Progress Notes (Signed)
 Hct 40. Patient does not require phlebotomy today.

## 2024-12-21 ENCOUNTER — Encounter: Payer: Self-pay | Admitting: Internal Medicine

## 2024-12-21 ENCOUNTER — Telehealth: Payer: Self-pay | Admitting: Internal Medicine

## 2024-12-21 NOTE — Telephone Encounter (Signed)
 Pt called to r/s appt to this Friday instead of next Friday since he has a day off - r/s w/pt - pt confirmed new date/time - appt reminder sent via mychart - LH

## 2024-12-24 ENCOUNTER — Telehealth: Payer: Self-pay | Admitting: *Deleted

## 2024-12-24 ENCOUNTER — Other Ambulatory Visit: Payer: Self-pay | Admitting: *Deleted

## 2024-12-24 DIAGNOSIS — D45 Polycythemia vera: Secondary | ICD-10-CM

## 2024-12-24 NOTE — Telephone Encounter (Signed)
 Patient dropped off handicap voucher and requested call back to discuss further.  I reached out to patient to further discuss. Caller verified using pt's full name and dob prior to discussing PHI    Patient sated that he has worsening gout and can not stand on the floors for long periods of time or walk to far from his car. He reports hypertension and diabetes. He is requesting handicap voucher. I explained to him that his primary care provider needs to complete this not Dr. Rennie. He needs to contact Dr. Alla. Pt stated that he would just come back and pick up the form from cancer [facility] and take it to pcp.

## 2024-12-25 ENCOUNTER — Inpatient Hospital Stay: Attending: Internal Medicine

## 2024-12-25 ENCOUNTER — Inpatient Hospital Stay (HOSPITAL_BASED_OUTPATIENT_CLINIC_OR_DEPARTMENT_OTHER)

## 2024-12-25 VITALS — BP 126/78 | HR 90 | Temp 98.0°F | Resp 18

## 2024-12-25 DIAGNOSIS — D45 Polycythemia vera: Secondary | ICD-10-CM

## 2024-12-25 LAB — HEMOGLOBIN AND HEMATOCRIT (CANCER CENTER ONLY)
HCT: 44 % (ref 39.0–52.0)
Hemoglobin: 13.4 g/dL (ref 13.0–17.0)

## 2024-12-25 NOTE — Progress Notes (Signed)
 Derek [name] presents today for phlebotomy per MD orders. Phlebotomy procedure started at 1350 and ended at 1358. 350 mls removed. Patient tolerated procedure well. IV needle removed intact.

## 2024-12-25 NOTE — Patient Instructions (Signed)

## 2024-12-28 ENCOUNTER — Inpatient Hospital Stay

## 2025-01-27 ENCOUNTER — Inpatient Hospital Stay

## 2025-02-24 ENCOUNTER — Inpatient Hospital Stay

## 2025-02-24 ENCOUNTER — Inpatient Hospital Stay: Admitting: Internal Medicine
